# Patient Record
Sex: Male | Born: 1935 | Race: White | Hispanic: No | Marital: Married | Smoking: Never smoker
Health system: Southern US, Community
[De-identification: ages and names within clinical notes are randomized; demographics above are authoritative.]

## PROBLEM LIST (undated history)

## (undated) DIAGNOSIS — I255 Ischemic cardiomyopathy: Secondary | ICD-10-CM

## (undated) DIAGNOSIS — E669 Obesity, unspecified: Secondary | ICD-10-CM

## (undated) DIAGNOSIS — I442 Atrioventricular block, complete: Secondary | ICD-10-CM

## (undated) DIAGNOSIS — N529 Male erectile dysfunction, unspecified: Secondary | ICD-10-CM

## (undated) DIAGNOSIS — E785 Hyperlipidemia, unspecified: Secondary | ICD-10-CM

## (undated) DIAGNOSIS — Z8619 Personal history of other infectious and parasitic diseases: Secondary | ICD-10-CM

## (undated) DIAGNOSIS — Z95810 Presence of automatic (implantable) cardiac defibrillator: Secondary | ICD-10-CM

## (undated) DIAGNOSIS — I480 Paroxysmal atrial fibrillation: Secondary | ICD-10-CM

## (undated) DIAGNOSIS — I495 Sick sinus syndrome: Secondary | ICD-10-CM

## (undated) DIAGNOSIS — G4733 Obstructive sleep apnea (adult) (pediatric): Secondary | ICD-10-CM

## (undated) DIAGNOSIS — E119 Type 2 diabetes mellitus without complications: Secondary | ICD-10-CM

## (undated) DIAGNOSIS — I10 Essential (primary) hypertension: Secondary | ICD-10-CM

## (undated) HISTORY — DX: Hyperlipidemia, unspecified: E78.5

## (undated) HISTORY — DX: Type 2 diabetes mellitus without complications: E11.9

## (undated) HISTORY — DX: Sick sinus syndrome: I49.5

## (undated) HISTORY — PX: TONSILLECTOMY: SUR1361

## (undated) HISTORY — DX: Ischemic cardiomyopathy: I25.5

## (undated) HISTORY — DX: Personal history of other infectious and parasitic diseases: Z86.19

## (undated) HISTORY — DX: Atrioventricular block, complete: I44.2

## (undated) HISTORY — DX: Obstructive sleep apnea (adult) (pediatric): G47.33

## (undated) HISTORY — DX: Presence of automatic (implantable) cardiac defibrillator: Z95.810

## (undated) HISTORY — PX: CORONARY ARTERY BYPASS GRAFT: SHX141

## (undated) HISTORY — DX: Essential (primary) hypertension: I10

## (undated) HISTORY — DX: Obesity, unspecified: E66.9

## (undated) HISTORY — DX: Paroxysmal atrial fibrillation: I48.0

## (undated) HISTORY — DX: Male erectile dysfunction, unspecified: N52.9

---

## 1999-06-25 ENCOUNTER — Inpatient Hospital Stay (HOSPITAL_COMMUNITY): Admission: EM | Admit: 1999-06-25 | Discharge: 1999-07-09 | Payer: Self-pay | Admitting: Internal Medicine

## 1999-06-28 ENCOUNTER — Encounter: Payer: Self-pay | Admitting: *Deleted

## 1999-06-29 ENCOUNTER — Encounter: Payer: Self-pay | Admitting: Cardiothoracic Surgery

## 1999-06-30 ENCOUNTER — Encounter: Payer: Self-pay | Admitting: Cardiothoracic Surgery

## 1999-07-01 ENCOUNTER — Encounter: Payer: Self-pay | Admitting: Cardiothoracic Surgery

## 1999-07-02 ENCOUNTER — Encounter: Payer: Self-pay | Admitting: Cardiothoracic Surgery

## 1999-07-03 ENCOUNTER — Encounter: Payer: Self-pay | Admitting: Cardiothoracic Surgery

## 1999-07-04 ENCOUNTER — Encounter: Payer: Self-pay | Admitting: Cardiothoracic Surgery

## 1999-07-06 ENCOUNTER — Encounter: Payer: Self-pay | Admitting: Cardiothoracic Surgery

## 1999-07-07 ENCOUNTER — Encounter: Payer: Self-pay | Admitting: Cardiothoracic Surgery

## 1999-07-08 ENCOUNTER — Encounter: Payer: Self-pay | Admitting: Cardiothoracic Surgery

## 2001-07-24 ENCOUNTER — Ambulatory Visit (HOSPITAL_COMMUNITY): Admission: RE | Admit: 2001-07-24 | Discharge: 2001-07-25 | Payer: Self-pay | Admitting: Internal Medicine

## 2001-07-24 ENCOUNTER — Encounter: Payer: Self-pay | Admitting: Internal Medicine

## 2001-07-25 ENCOUNTER — Encounter: Payer: Self-pay | Admitting: Internal Medicine

## 2004-11-11 ENCOUNTER — Ambulatory Visit: Payer: Self-pay

## 2005-02-14 ENCOUNTER — Ambulatory Visit: Payer: Self-pay | Admitting: Internal Medicine

## 2005-02-28 ENCOUNTER — Ambulatory Visit: Payer: Self-pay

## 2005-04-04 ENCOUNTER — Ambulatory Visit: Payer: Self-pay | Admitting: Internal Medicine

## 2005-05-11 ENCOUNTER — Ambulatory Visit: Payer: Self-pay | Admitting: Internal Medicine

## 2005-06-20 ENCOUNTER — Ambulatory Visit: Payer: Self-pay | Admitting: Internal Medicine

## 2005-08-16 ENCOUNTER — Ambulatory Visit: Payer: Self-pay | Admitting: Internal Medicine

## 2005-09-19 ENCOUNTER — Ambulatory Visit: Payer: Self-pay | Admitting: Internal Medicine

## 2005-10-25 ENCOUNTER — Ambulatory Visit: Payer: Self-pay | Admitting: Internal Medicine

## 2005-11-28 ENCOUNTER — Ambulatory Visit: Payer: Self-pay | Admitting: Internal Medicine

## 2006-01-18 ENCOUNTER — Ambulatory Visit: Payer: Self-pay | Admitting: Internal Medicine

## 2006-04-19 ENCOUNTER — Ambulatory Visit: Payer: Self-pay | Admitting: Internal Medicine

## 2006-05-15 ENCOUNTER — Ambulatory Visit: Payer: Self-pay | Admitting: Internal Medicine

## 2006-06-26 ENCOUNTER — Ambulatory Visit: Payer: Self-pay | Admitting: Internal Medicine

## 2006-07-28 ENCOUNTER — Ambulatory Visit: Payer: Self-pay | Admitting: Internal Medicine

## 2006-08-28 ENCOUNTER — Ambulatory Visit: Payer: Self-pay | Admitting: Internal Medicine

## 2006-09-25 ENCOUNTER — Ambulatory Visit: Payer: Self-pay | Admitting: Internal Medicine

## 2006-10-23 ENCOUNTER — Ambulatory Visit: Payer: Self-pay | Admitting: Internal Medicine

## 2006-11-20 ENCOUNTER — Ambulatory Visit: Payer: Self-pay | Admitting: Internal Medicine

## 2006-12-18 ENCOUNTER — Ambulatory Visit: Payer: Self-pay | Admitting: Internal Medicine

## 2007-01-15 ENCOUNTER — Ambulatory Visit: Payer: Self-pay | Admitting: Internal Medicine

## 2007-02-13 ENCOUNTER — Ambulatory Visit: Payer: Self-pay | Admitting: Internal Medicine

## 2007-03-12 ENCOUNTER — Ambulatory Visit: Payer: Self-pay | Admitting: Internal Medicine

## 2007-04-09 ENCOUNTER — Ambulatory Visit: Payer: Self-pay | Admitting: Internal Medicine

## 2007-04-23 ENCOUNTER — Ambulatory Visit: Payer: Self-pay

## 2007-05-07 ENCOUNTER — Ambulatory Visit: Payer: Self-pay | Admitting: Internal Medicine

## 2007-06-04 ENCOUNTER — Ambulatory Visit: Payer: Self-pay | Admitting: Internal Medicine

## 2007-07-02 ENCOUNTER — Ambulatory Visit: Payer: Self-pay | Admitting: Internal Medicine

## 2007-07-30 ENCOUNTER — Ambulatory Visit: Payer: Self-pay | Admitting: Internal Medicine

## 2007-08-27 ENCOUNTER — Ambulatory Visit: Payer: Self-pay | Admitting: Internal Medicine

## 2007-09-24 ENCOUNTER — Ambulatory Visit: Payer: Self-pay | Admitting: Internal Medicine

## 2007-10-22 ENCOUNTER — Ambulatory Visit: Payer: Self-pay | Admitting: Internal Medicine

## 2007-11-19 ENCOUNTER — Ambulatory Visit: Payer: Self-pay | Admitting: Internal Medicine

## 2008-03-17 ENCOUNTER — Ambulatory Visit: Payer: Self-pay | Admitting: Internal Medicine

## 2008-04-21 ENCOUNTER — Ambulatory Visit: Payer: Self-pay

## 2008-06-16 ENCOUNTER — Ambulatory Visit: Payer: Self-pay | Admitting: Internal Medicine

## 2008-09-15 ENCOUNTER — Ambulatory Visit: Payer: Self-pay | Admitting: Internal Medicine

## 2008-11-07 ENCOUNTER — Ambulatory Visit: Payer: Self-pay | Admitting: Internal Medicine

## 2008-12-15 ENCOUNTER — Ambulatory Visit: Payer: Self-pay | Admitting: Internal Medicine

## 2009-01-23 ENCOUNTER — Encounter: Payer: Self-pay | Admitting: Internal Medicine

## 2009-01-27 ENCOUNTER — Ambulatory Visit: Payer: Self-pay | Admitting: Internal Medicine

## 2009-04-06 ENCOUNTER — Telehealth (INDEPENDENT_AMBULATORY_CARE_PROVIDER_SITE_OTHER): Payer: Self-pay | Admitting: *Deleted

## 2009-06-15 ENCOUNTER — Ambulatory Visit: Payer: Self-pay | Admitting: Internal Medicine

## 2009-08-04 ENCOUNTER — Ambulatory Visit: Payer: Self-pay | Admitting: Internal Medicine

## 2009-09-14 ENCOUNTER — Ambulatory Visit: Payer: Self-pay | Admitting: Internal Medicine

## 2009-10-05 ENCOUNTER — Encounter: Payer: Self-pay | Admitting: Internal Medicine

## 2009-10-05 ENCOUNTER — Ambulatory Visit: Payer: Self-pay

## 2009-11-03 ENCOUNTER — Ambulatory Visit: Payer: Self-pay | Admitting: Internal Medicine

## 2009-11-04 ENCOUNTER — Encounter: Payer: Self-pay | Admitting: Internal Medicine

## 2009-11-06 ENCOUNTER — Telehealth: Payer: Self-pay | Admitting: Internal Medicine

## 2009-11-19 ENCOUNTER — Telehealth (INDEPENDENT_AMBULATORY_CARE_PROVIDER_SITE_OTHER): Payer: Self-pay | Admitting: *Deleted

## 2009-11-19 ENCOUNTER — Ambulatory Visit: Payer: Self-pay | Admitting: Internal Medicine

## 2009-11-19 DIAGNOSIS — R55 Syncope and collapse: Secondary | ICD-10-CM

## 2009-11-24 ENCOUNTER — Telehealth (INDEPENDENT_AMBULATORY_CARE_PROVIDER_SITE_OTHER): Payer: Self-pay | Admitting: *Deleted

## 2009-11-25 ENCOUNTER — Ambulatory Visit: Payer: Self-pay | Admitting: Cardiology

## 2009-11-25 ENCOUNTER — Encounter: Payer: Self-pay | Admitting: Internal Medicine

## 2009-11-25 ENCOUNTER — Encounter (HOSPITAL_COMMUNITY): Admission: RE | Admit: 2009-11-25 | Discharge: 2010-01-25 | Payer: Self-pay | Admitting: Internal Medicine

## 2009-11-25 ENCOUNTER — Ambulatory Visit: Payer: Self-pay

## 2009-12-01 ENCOUNTER — Ambulatory Visit: Payer: Self-pay | Admitting: Internal Medicine

## 2009-12-02 ENCOUNTER — Telehealth: Payer: Self-pay | Admitting: Cardiology

## 2009-12-10 ENCOUNTER — Encounter: Payer: Self-pay | Admitting: Internal Medicine

## 2009-12-14 ENCOUNTER — Telehealth: Payer: Self-pay | Admitting: Internal Medicine

## 2009-12-15 ENCOUNTER — Ambulatory Visit: Payer: Self-pay | Admitting: Internal Medicine

## 2009-12-15 LAB — CONVERTED CEMR LAB
BUN: 16 mg/dL (ref 6–23)
Basophils Absolute: 0.1 10*3/uL (ref 0.0–0.1)
Basophils Relative: 0.9 % (ref 0.0–3.0)
CO2: 30 meq/L (ref 19–32)
Calcium: 10.6 mg/dL — ABNORMAL HIGH (ref 8.4–10.5)
Chloride: 104 meq/L (ref 96–112)
Creatinine, Ser: 1.1 mg/dL (ref 0.4–1.5)
Eosinophils Absolute: 0.5 10*3/uL (ref 0.0–0.7)
Eosinophils Relative: 6.3 % — ABNORMAL HIGH (ref 0.0–5.0)
GFR calc non Af Amer: 68.92 mL/min (ref >60)
Glucose, Bld: 107 mg/dL — ABNORMAL HIGH (ref 70–99)
HCT: 45.9 % (ref 39.0–52.0)
Hemoglobin: 15.1 g/dL (ref 13.0–17.0)
INR: 1.1 — ABNORMAL HIGH (ref 0.8–1.0)
Lymphocytes Relative: 21.6 % (ref 12.0–46.0)
Lymphs Abs: 1.7 10*3/uL (ref 0.7–4.0)
MCHC: 32.8 g/dL (ref 30.0–36.0)
MCV: 98.3 fL (ref 78.0–100.0)
Monocytes Absolute: 0.6 10*3/uL (ref 0.1–1.0)
Monocytes Relative: 7.3 % (ref 3.0–12.0)
Neutro Abs: 5.1 10*3/uL (ref 1.4–7.7)
Neutrophils Relative %: 63.9 % (ref 43.0–77.0)
Platelets: 214 10*3/uL (ref 150.0–400.0)
Potassium: 4.1 meq/L (ref 3.5–5.1)
Prothrombin Time: 11.3 s (ref 9.1–11.7)
RBC: 4.67 M/uL (ref 4.22–5.81)
RDW: 12.9 % (ref 11.5–14.6)
Sodium: 139 meq/L (ref 135–145)
WBC: 8 10*3/uL (ref 4.5–10.5)
aPTT: 27.6 s (ref 21.7–28.8)

## 2009-12-16 ENCOUNTER — Inpatient Hospital Stay (HOSPITAL_BASED_OUTPATIENT_CLINIC_OR_DEPARTMENT_OTHER): Admission: RE | Admit: 2009-12-16 | Discharge: 2009-12-16 | Payer: Self-pay | Admitting: Cardiology

## 2009-12-16 ENCOUNTER — Ambulatory Visit: Payer: Self-pay | Admitting: Cardiology

## 2009-12-22 ENCOUNTER — Ambulatory Visit: Payer: Self-pay | Admitting: Internal Medicine

## 2009-12-29 ENCOUNTER — Ambulatory Visit: Payer: Self-pay | Admitting: Internal Medicine

## 2010-01-20 ENCOUNTER — Inpatient Hospital Stay (HOSPITAL_COMMUNITY): Admission: EM | Admit: 2010-01-20 | Discharge: 2010-01-22 | Payer: Self-pay | Admitting: Cardiology

## 2010-01-20 ENCOUNTER — Ambulatory Visit: Payer: Self-pay

## 2010-01-20 ENCOUNTER — Encounter: Payer: Self-pay | Admitting: Internal Medicine

## 2010-01-20 ENCOUNTER — Ambulatory Visit: Payer: Self-pay | Admitting: Cardiology

## 2010-01-21 ENCOUNTER — Encounter (INDEPENDENT_AMBULATORY_CARE_PROVIDER_SITE_OTHER): Payer: Self-pay | Admitting: Cardiology

## 2010-01-22 ENCOUNTER — Encounter: Payer: Self-pay | Admitting: Internal Medicine

## 2010-01-26 ENCOUNTER — Encounter: Payer: Self-pay | Admitting: Internal Medicine

## 2010-02-01 ENCOUNTER — Ambulatory Visit: Payer: Self-pay

## 2010-02-01 ENCOUNTER — Encounter: Payer: Self-pay | Admitting: Internal Medicine

## 2010-02-19 ENCOUNTER — Telehealth: Payer: Self-pay | Admitting: Internal Medicine

## 2010-05-10 ENCOUNTER — Ambulatory Visit: Payer: Self-pay | Admitting: Internal Medicine

## 2010-08-11 ENCOUNTER — Encounter: Payer: Self-pay | Admitting: Internal Medicine

## 2010-08-12 ENCOUNTER — Ambulatory Visit: Payer: Self-pay | Admitting: Internal Medicine

## 2010-09-01 ENCOUNTER — Encounter: Payer: Self-pay | Admitting: Internal Medicine

## 2010-11-23 ENCOUNTER — Ambulatory Visit
Admission: RE | Admit: 2010-11-23 | Discharge: 2010-11-23 | Payer: Self-pay | Source: Home / Self Care | Attending: Internal Medicine | Admitting: Internal Medicine

## 2010-11-23 ENCOUNTER — Encounter: Payer: Self-pay | Admitting: Internal Medicine

## 2010-12-21 NOTE — Assessment & Plan Note (Signed)
Summary: Cardiology Nuclear Study  Nuclear Med Background Indications for Stress Test: Evaluation for Ischemia, Graft Patency   History: CABG, Echo, Heart Catheterization, Myocardial Infarction, Pacemaker  History Comments: '00 Heart Cath '00 Echo: NL, LVF 9/02 Pacemaker: Complete HB  Symptoms: Light-Headedness, Syncope    Nuclear Pre-Procedure Cardiac Risk Factors: Carotid Disease, Hypertension, Lipids, Obesity Caffeine/Decaff Intake: None NPO After: 6:00 PM Lungs: clear IV 0.9% NS with Angio Cath: 20g     IV Site: (R) AC IV Started by: Irven Baltimore RN Chest Size (in) 56     Height (in): 73 Weight (lb): 330 BMI: 43.70  Nuclear Med Study 1 or 2 day study:  1 day     Stress Test Type:  Adenosine Reading MD:  Loralie Champagne, MD     Referring MD:  S.Klein Resting Radionuclide:  Technetium 1m Tetrofosmin     Resting Radionuclide Dose:  11.0 mCi  Stress Radionuclide:  Technetium 66m Tetrofosmin     Stress Radionuclide Dose:  33.0 mCi   Stress Protocol  Dose of Adenosine:  60.0 mg    Stress Test Technologist:  Perrin Maltese EMT-P     Nuclear Technologist:  Mariann Laster Deal RT-N  Rest Procedure  Myocardial perfusion imaging was performed at rest 45 minutes following the intravenous administration of Myoview Technetium 16m Tetrofosmin.  Stress Procedure  The patient received IV adenosine at 140 mcg/kg/min for 4 minutes. There were no significant changes with infusion. Myoview was injected at the 2 minute mark and quantitative spect images were obtained after a 45 minute delay.  QPS Raw Data Images:  Normal; no motion artifact; normal heart/lung ratio. Stress Images:  Large, severe perfusion defect involving the entire inferior wall to the apex.  Rest Images:  Moderate perfusion defect in the inferior wall.  Subtraction (SDS):  Inferior perfusion defect with significant reversibility.  Transient Ischemic Dilatation:  1.22  (Normal <1.22)  Lung/Heart Ratio:  .32  (Normal  <0.45)  Quantitative Gated Spect Images QGS EDV:  205 ml QGS ESV:  138 ml QGS EF:  33 % QGS cine images:  Inferior and septal hypokinesis.    Overall Impression  Exercise Capacity: Adenosine study with no exercise. BP Response: Normal blood pressure response. Clinical Symptoms: Hot, lightheaded.  ECG Impression: V-paced with left bundle pattern.  Overall Impression: There is a severe inferior perfusion defect with stress that is somewhat improved with rest.  Overall Impression Comments: Probable inferior infarction with significant peri-infarct ischemia.  Moderate LV systolic dysfunction with inferior severe hypokinesis.   Appended Document: Cardiology Nuclear Study Problem # 1:  SYNCOPE (ICD-780.2) the patient had an episode of syncope in the setting of known coronary disease previous normal left ventricular function tachybradycardia syndrome with pacemaker implanted. Interrogation of the pacemaker demonstrated no evidence of ventricular tachycardia; device function was normalthe event occurred in the setting of micturition and rapid standing. I suspect it represents a neurally mediated/vasovagal episode as was thought by Dr. Lin Landsman. However, in a patient with ischemic heart disease it is important he should there has been no intercurrent change in his left ventricular function and we'll undertake Myoview scanning to look perfusion function and for evidence of scar His updated medication list for this problem includes:    Toprol Xl 50 Mg Xr24h-tab (Metoprolol succinate) .Marland Kitchen... Take one tab once daily    Aspirin 81 Mg Tbec (Aspirin) .Marland Kitchen... Take one tablet by mouth daily  Other Orders: Nuclear Stress Test (Nuc Stress Test)   Abnormal..Marland KitchenPlease Advise  Appended Document: Cardiology Nuclear  Study Per Dr. Caryl Comes, his heart muscle is weaker and he has a potential blockage. He will need set up for a JV Cath.  Appended Document: Cardiology Nuclear Study left mess w/pts wife for him to call  back and ask for mess rn  Appended Document: Cardiology Nuclear Study pt aware, cath sch for Wed 1/26 at 8:30 w/Dr Lia Foyer

## 2010-12-21 NOTE — Procedures (Signed)
Summary: wound check   Current Medications (verified): 1)  Lipitor 40 Mg Tabs (Atorvastatin Calcium) .... Take One Tab Once Daily 2)  Toprol Xl 50 Mg Xr24h-Tab (Metoprolol Succinate) .... Take One Tab Once Daily 3)  Synthroid 175 Mcg Tabs (Levothyroxine Sodium) .... Take One Tab Once Daily 4)  Coumadin .... Take As Directed 5)  Avodart 0.5 Mg Caps (Dutasteride) .... Once Daily 6)  Flomax 0.4 Mg Xr24h-Cap (Tamsulosin Hcl) .... Once Daily 7)  Aspirin 81 Mg Tbec (Aspirin) .... Take One Tablet By Mouth Daily 8)  Super Beta Prostate 600 Mg .... Once Daily 9)  Diovan Hct 160-25 Mg Tabs (Valsartan-Hydrochlorothiazide) .... Take One Tablet Once Daily 10)  Metformin Hcl 500 Mg Tabs (Metformin Hcl) .... Take One Tablet Two Times A Day  Allergies (verified): No Known Drug Allergies  PPM Specifications Following MD:  Virl Axe, MD     PPM Vendor:  Biotronik     PPM Model Number:  [id number]     PPM Serial Number:  [id number] DOI:  07/24/2001     PPM Implanting MD:  Virl Axe, MD  Lead 1    Location: RA     DOI: 07/24/2001     Model #: [id number]     Serial #: [id number]     Status: active Lead 2    Location: RV     DOI: 07/24/2001     Model #: [id number]     Serial #: [id number]     Status: active  Magnet Response Rate:  BOL 10BTS @ 90 -PR ERI  10BTS @ 80-11% PR  Indications:  Syncope  Explantation Comments:  01/21/10 Biotronik [id number] explanted.  PPM Follow Up Pacer Dependent:  Yes      Episodes Coumadin:  No  Parameters Mode:  DDDR     Lower Rate Limit:  60     Upper Rate Limit:  130 Paced AV Delay:  300      ICD Specifications Following MD:  Virl Axe, MD     ICD Vendor:  St Jude     ICD Model Number:  [id number]     ICD Serial Number:  [id number] ICD DOI:  01/21/2010     ICD Implanting MD:  Thompson Grayer, MD  Lead 1:    Location: RV     DOI: 01/21/2010     Model #: [id number]     Serial #: [id number]     Status: active Lead 2:    Location: RA     DOI: 08/20/2001     Model #: [id number]     Serial  #: [id number]     Status: active Lead 3:    Location: RV     DOI: 08/20/2001     Model #: [id number]     Serial #: [id number]     Status: capped  Indications::  CM   ICD Follow Up Remote Check?  No Charge Time:  8.3 seconds     Battery Est. Longevity:  8.2 YEARS Underlying rhythm:  dependent ICD Dependent:  Yes       ICD Device Measurements Atrium:  Amplitude: 4.9 mV, Impedance: 430 ohms, Threshold: 0.75 V at 0.5 msec Right Ventricle:  Impedance: 400 ohms, Threshold: 0.75 V at 0.5 msec Shock Impedance: 46 ohms   Episodes MS Episodes:  0     Percent Mode Switch:  0     Coumadin:  Yes Shock:  0     ATP:  0     Nonsustained:  0      Brady Parameters Mode DDD     Lower Rate Limit:  60     Upper Rate Limit 120 PAV 190     Sensed AV Delay:  170  Tachy Zones VF:  200     VT:  171     Next Cardiology Appt Due:  04/21/2010 Tech Comments:  No parameter changes.   Device function normal.  Steri strips removed, no redness, some edema noted.  Patient instructions for device discharge and activity restrictions review with the patient.  He understands and is in agreement.  ROV 3 months with Dr. Caryl Comes in the [facility] office.  Alma Friendly, LPN  March 14, 624THL 075-GRM PM

## 2010-12-21 NOTE — Cardiovascular Report (Signed)
Summary: Office Visit   Office Visit   Imported By: Sallee Provencal 12/08/2009 09:58:26  _____________________________________________________________________  External Attachment:    Type:   Image     Comment:   External Document

## 2010-12-21 NOTE — Cardiovascular Report (Signed)
Summary: Office Visit   Office Visit   Imported By: Sallee Provencal 02/08/2010 15:18:01  _____________________________________________________________________  External Attachment:    Type:   Image     Comment:   External Document

## 2010-12-21 NOTE — Cardiovascular Report (Signed)
Summary: TTM   TTM   Imported By: Sallee Provencal 12/23/2009 10:21:00  _____________________________________________________________________  External Attachment:    Type:   Image     Comment:   External Document

## 2010-12-21 NOTE — Cardiovascular Report (Signed)
Summary: Office Visit Remote   Office Visit Remote   Imported By: Sallee Provencal 09/02/2010 11:14:40  _____________________________________________________________________  External Attachment:    Type:   Image     Comment:   External Document

## 2010-12-21 NOTE — Procedures (Signed)
Summary: ABNORMAL EKG/ MMR   Current Medications (verified): 1)  Lipitor 40 Mg Tabs (Atorvastatin Calcium) .... Take One Tab Once Daily 2)  Toprol Xl 50 Mg Xr24h-Tab (Metoprolol Succinate) .... Take One Tab Once Daily 3)  Synthroid 175 Mcg Tabs (Levothyroxine Sodium) .... Take One Tab Once Daily 4)  Coumadin .... Take As Directed 5)  Flomax 0.4 Mg Xr24h-Cap (Tamsulosin Hcl) .... Once Daily 6)  Aspirin 81 Mg Tbec (Aspirin) .... Take One Tablet By Mouth Daily 7)  Super Beta Prostate 600 Mg .... Once Daily 8)  Diovan Hct 160-25 Mg Tabs (Valsartan-Hydrochlorothiazide) .... Take One Tablet Once Daily 9)  Metformin Hcl 500 Mg Tabs (Metformin Hcl) .... Take One Tablet Two Times A Day  Allergies (verified): No Known Drug Allergies  PPM Specifications Following MD:  Virl Axe, MD     PPM Vendor:  Biotronik     PPM Model Number:  [id number]     PPM Serial Number:  [id number] PPM DOI:  07/24/2001     PPM Implanting MD:  Virl Axe, MD  Lead 1    Location: RA     DOI: 07/24/2001     Model #: [id number]     Serial #: [id number]     Status: active Lead 2    Location: RV     DOI: 07/24/2001     Model #: [id number]     Serial #: [id number]     Status: active  Magnet Response Rate:  BOL 10BTS @ 90 -PR ERI  10BTS @ 80-11% PR  Indications:  Syncope  Explantation Comments:  TTM's with [employer]  PPM Follow Up Remote Check?  No Battery Voltage:  2.51 V     Battery Est. Longevity:  ERI     Pacer Dependent:  Yes      Episodes Coumadin:  No  Parameters Mode:  DDDR     Lower Rate Limit:  60     Upper Rate Limit:  130 Paced AV Delay:  300     Tech Comments:  Device @ ERI.  Estimated <3 months until EOL and Caleb Lyons is dependent.  He also c/o random near syncopal episodes.  The device does revert to DDD only but his histograms are adequate.  He will follow up with Dr. Caryl Comes.   Alma Friendly, LPN  March  2, 624THL D34-534 PM

## 2010-12-21 NOTE — Procedures (Signed)
Summary: Cardiology Device Clinic   Allergies: No Known Drug Allergies  PPM Specifications Following MD:  Virl Axe, MD     PPM Vendor:  Biotronik     PPM Model Number:  [id number]     PPM Serial Number:  [id number] DOI:  07/24/2001     PPM Implanting MD:  Virl Axe, MD  Lead 1    Location: RA     DOI: 07/24/2001     Model #: [id number]     Serial #: [id number]     Status: active Lead 2    Location: RV     DOI: 07/24/2001     Model #: [id number]     Serial #: [id number]     Status: active  Magnet Response Rate:  BOL 10BTS @ 90 -PR ERI  10BTS @ 80-11% PR  Indications:  Syncope  Explantation Comments:  01/21/10 Biotronik [id number] explanted.  PPM Follow Up Pacer Dependent:  Yes      Episodes Coumadin:  No  Parameters Mode:  DDDR     Lower Rate Limit:  60     Upper Rate Limit:  130 Paced AV Delay:  300      ICD Specifications Following MD:  Virl Axe, MD     ICD Vendor:  St Jude     ICD Model Number:  [phone]     ICD Serial Number:  [id number] ICD DOI:  01/21/2010     ICD Implanting MD:  Thompson Grayer, MD  Lead 1:    Location: RV     DOI: 01/21/2010     Model #: [id number]     Serial #: [id number]     Status: active Lead 2:    Location: RA     DOI: 08/20/2001     Model #: [id number]     Serial #: [id number]     Status: active Lead 3:    Location: RV     DOI: 08/20/2001     Model #: [id number]     Serial #: [id number]     Status: capped  Indications::  CM   ICD Follow Up Remote Check?  No Charge Time:  8.3 seconds     Battery Est. Longevity:  8 years Underlying rhythm:  dependent ICD Dependent:  Yes       ICD Device Measurements Atrium:  Amplitude: 5.0 mV, Impedance: 450 ohms, Threshold: 0.75 V at 0.5 msec Right Ventricle:  Impedance: 460 ohms, Threshold: 0.625 V at 0.5 msec Shock Impedance: 48 ohms   Episodes MS Episodes:  0     Percent Mode Switch:  0     Coumadin:  Yes Shock:  0     ATP:  0     Nonsustained:  0     Atrial Pacing:  38%     Ventricular Pacing:  100%  Brady Parameters Mode  DDD     Lower Rate Limit:  60     Upper Rate Limit 120 PAV 190     Sensed AV Delay:  170  Tachy Zones VF:  200     VT:  171     Next Remote Date:  08/12/2010     Next Cardiology Appt Due:  04/22/2011 Tech Comments:  No parameter changes.  Device function normal.  Merlin transmissions every 3 months.  ROV 1 year with Dr. Caryl Comes in [place]. Alma Friendly, LPN  June 20, 624THL 624THL AM

## 2010-12-21 NOTE — Progress Notes (Signed)
Summary: Nuclear Pre-Procedure  Phone Note Outgoing Call Call back at [facility] Phone [phone]   Call placed by: Eliezer Lofts, EMT-P,  November 24, 2009 11:29 AM Action Taken: Phone Call Completed Summary of Call: Left message with information on Myoview Information Sheet (see scanned document for details).     Nuclear Med Background Indications for Stress Test: Evaluation for Ischemia, Graft Patency   History: CABG, Echo, Heart Catheterization, Myocardial Infarction, Pacemaker  History Comments: '00 Heart Cath '00 Echo: NL, LVF 9/02 Pacemaker: Complete HB  Symptoms: Light-Headedness, Syncope    Nuclear Pre-Procedure Cardiac Risk Factors: Carotid Disease, Hypertension, Lipids, Obesity Height (in): 73

## 2010-12-21 NOTE — Progress Notes (Signed)
Summary: test results  Phone Note Call from Patient Call back at Home Phone [phone]   Caller: Patient Reason for Call: Lab or Test Results Summary of Call: request results of stress test Initial call taken by: Darnell Level,  December 02, 2009 10:20 AM  Follow-up for Phone Call        PT AWARE OF RESULTS Follow-up by: Devra Dopp, LPN,  January 12, 624THL 10:33 AM     Appended Document: test results Would make sure this result gets to Dr. Caryl Comes.  I believe this is his patient.

## 2010-12-21 NOTE — Letter (Signed)
Summary: Remote Device Check  [employer], [place]  [id number] [place]   [place], [place] [address]   Phone: [phone]  Fax: [phone]     September 01, 2010 MRN: [id number]   Caleb Lyons [address] [place],   [address]   Dear Caleb Lyons,   Your remote transmission was recieved and reviewed by your physician.  All diagnostics were within normal limits for you.   __X____Your next office visit is scheduled for:  11-23-2010 with Dr Caryl Comes.    Sincerely,  Shelly Bombard

## 2010-12-21 NOTE — Cardiovascular Report (Signed)
Summary: TTM   TTM   Imported By: Sallee Provencal 01/08/2010 15:49:29  _____________________________________________________________________  External Attachment:    Type:   Image     Comment:   External Document

## 2010-12-21 NOTE — Assessment & Plan Note (Signed)
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 He is doing quite well and denies chest pain exercise intolerance. .  He had an episode of syncope that occurred in the bathroom. He urinated while sitting down and stood up to brush his teeth when he became lightheaded and abruptly lost consciousness. He hit his head and hand up with some bleeding. He was able to stand up shortly thereafter without residual orthostatic intolerance.  He does have a history of prior orthostatic lightheadedness.  echo cardiogram in the spring of 2000 and demonstrated normal left ventricular function    He underwent Myoview scanning which demonstrated ejection fraction 33% and a prior infarct. Because of the depressed LV function he underwent   catheterization done last week demonstrated  1. Continued patency of the internal mammary to the left anterior     descending. 2. Continued patency of the saphenous vein grafts to the intermediate,     diagonal, and sequentially to the obtuse marginal and distal     circumflex as noted above. 3. Continued patency of the right coronary artery with some modest     luminal irregularity.  He is approaching ERI. He is currently without complaint has been exercising again without shortness of breath or chest pain  Current Medications (verified): 1)  Lipitor 40 Mg Tabs (Atorvastatin Calcium) .... Take One Tab Once Daily 2)  Toprol Xl 50 Mg Xr24h-Tab (Metoprolol Succinate) .... Take One Tab Once Daily 3)  Synthroid 175 Mcg Tabs (Levothyroxine Sodium) .... Take One Tab Once Daily 4)  Coumadin .... Take As Directed 5)  Avodart 0.5 Mg Caps (Dutasteride) .... Once Daily 6)  Flomax 0.4 Mg Xr24h-Cap (Tamsulosin Hcl) .... Once Daily 7)  Aspirin 81 Mg Tbec (Aspirin) .... Take One Tablet By Mouth Daily 8)  Super Beta Prostate 600 Mg .... Once Daily 9)  Diovan Hct 160-25 Mg Tabs  (Valsartan-Hydrochlorothiazide) .... Take One Tablet Once Daily 10)  Metformin Hcl 500 Mg Tabs (Metformin Hcl) .... Take One Tablet Two Times A Day  Allergies (verified): No Known Drug Allergies  Past History:  Past Medical History: Last updated: 11/19/2009 Hyperlipidemia coronary artery disease with prior bypass grafting and normal left ventricular function Paroxysmal atrial fibrillation Sinus node dysfunction Hypertension Biotronik Philos [id number] Obesity  Family History: Last updated: 11/19/2009 Negative FH of Diabetes, Hypertension, or Coronary Artery Disease  Vital Signs:  Patient profile:   77 year old male Height:      73 inches Weight:      335 pounds BMI:     44.36 Pulse rate:   76 / minute Pulse rhythm:   regular BP sitting:   133 / 81  (right arm) Cuff size:   large  Vitals Entered By: Doug Sou CMA (December 22, 2009 2:38 PM)  Physical Exam  General:  The patient was alert and oriented in no acute distress. HEENT Normal.  Neck veins were flat, carotids were brisk.  Lungs were clear.  Heart sounds were regular without murmurs or gallops.  Abdomen was soft with active bowel sounds; protuberant There is no clubbing cyanosis or edema. Skin Warm and dry    PPM Specifications Following MD:  Virl Axe, MD     PPM Vendor:  Biotronik     PPM Model Number:  [id number]     [facility] Serial Number:  [id number] PPM DOI:  07/24/2001     PPM Implanting MD:  Virl Axe, MD  Lead 1    Location: RA     DOI: 07/24/2001     Model #: [id number]     Serial #: [id number]     Status: active Lead 2    Location: RV     DOI: 07/24/2001     Model #: [id number]     Serial #: [id number]     Status: active  Magnet Response Rate:  BOL 10BTS @ 90 -PR ERI  10BTS @ 80-11% PR  Indications:  Syncope  Explantation Comments:  TTM's with [employer]  PPM Follow Up Pacer Dependent:  Yes      Episodes Coumadin:  No  Parameters Mode:  DDDR     Lower Rate Limit:  60     Upper Rate Limit:  130 Paced AV  Delay:  300     Impression & Recommendations:  Problem # 1:  PACEMAKER, PERMANENT BIOTRONIK (ICD-V45.01) Device parameters and data were reviewed and no changes were made  He is approaching ERI. When he comes in for his preprocedural visit we will have to discuss the potential issues related to his cardiomyopathy syncope and device. Specifically we'll have to consider either in. ICD implantation versus EP testing for risk stratification with his borderline ejection fraction.  Problem # 2:  CARDIOMYOPATHY, ISCHEMIC S/P CABG EF 33% (ICD-414.8)  as above.  We will continue him on his current medications.  Will plan to begin Aldactone based on theEmphasis trial at time of device generator placement His updated medication list for this problem includes:    Toprol Xl 50 Mg Xr24h-tab (Metoprolol succinate) .Marland Kitchen... Take one tab once daily    Aspirin 81 Mg Tbec (Aspirin) .Marland Kitchen... Take one tablet by mouth daily    Diovan Hct 160-25 Mg Tabs (Valsartan-hydrochlorothiazide) .Marland Kitchen... Take one tablet once daily  Problem # 3:  SYNCOPE (ICD-780.2) Please see above discussion His updated medication list for this problem includes:    Toprol Xl 50 Mg Xr24h-tab (Metoprolol succinate) .Marland Kitchen... Take one tab once daily    Aspirin 81 Mg Tbec (Aspirin) .Marland Kitchen... Take one tablet by mouth daily  Problem # 4:  COUMADIN THERAPY (ICD-V58.61) Or records indicate that AF was identified in 2003  We will plan to rview our pacer records to see if there has been intercurrent AF which would justify the need to continue;  Patient Instructions: 1)  Your physician recommends that you schedule a follow-up appointment in: [place], PLEASE CANCEL APPT IN FEB WITH DR.  Caryl Comes

## 2010-12-21 NOTE — Assessment & Plan Note (Signed)
Summary: Device change out    PPM Specifications Following MD:  Virl Axe, MD     PPM Vendor:  Biotronik     PPM Model Number:  [id number]     PPM Serial Number:  [id number] PPM DOI:  07/24/2001     PPM Implanting MD:  Virl Axe, MD  Lead 1    Location: RA     DOI: 07/24/2001     Model #: [id number]     Serial #: [id number]     Status: active Lead 2    Location: RV     DOI: 07/24/2001     Model #: [id number]     Serial #: [id number]     Status: active  Magnet Response Rate:  BOL 10BTS @ 90 -PR ERI  10BTS @ 80-11% PR  Indications:  Syncope  Explantation Comments:  01/21/2010 Biotronik [id number] explanted  PPM Follow Up Pacer Dependent:  Yes      Episodes Coumadin:  No  Parameters Mode:  DDDR     Lower Rate Limit:  60     Upper Rate Limit:  130 Paced AV Delay:  300      ICD Specifications Following MD:  Virl Axe, MD     ICD Vendor:  St Jude     ICD Model Number:  [phone]     ICD Serial Number:  [id number] ICD DOI:  01/21/2010     ICD Implanting MD:  Virl Axe, MD  Lead 1:    Location: RV     DOI: 01/21/2010     Model #: [id number]     Serial #: [id number]     Status: active

## 2010-12-21 NOTE — Cardiovascular Report (Signed)
Summary: TTM   TTM   Imported By: Sallee Provencal 11/26/2009 11:52:59  _____________________________________________________________________  External Attachment:    Type:   Image     Comment:   External Document

## 2010-12-21 NOTE — Letter (Signed)
Summary: Cardiac Catheterization Instructions- [place], [place]  [id number] [address]. [address] [place]   [place], [place] [address]   Phone: [phone]  Fax: [phone]     12/10/2009 MRN: [id number]  JETON SIBBETT [address] [place],   [id number]  Dear Mr. RIGLER,   You are scheduled for a Cardiac Catheterization on Wed. 12/16/09 with Dr.Stuckey  Please arrive to the 1st floor of the Heart and Vascular Center at [facility] at 7:30 am / pm on the day of your procedure. Please do not arrive before 6:30 a.m. Call the Heart and Vascular Center at [phone] if you are unable to make your appointmnet. The Code to get into the parking garage under the building is 0090. Take the elevators to the 1st floor. You must have someone to drive you home. Someone must be with you for the first 24 hours after you arrive home. Please wear clothes that are easy to get on and off and wear slip-on shoes. Do not eat or drink after midnight except water with your medications that morning. Bring all your medications and current insurance cards with you.  _X__ DO NOT take these medications before your procedure:   Hold your Diovan/hct AM of test   You will also need to be off your coumadin, Threasa Beards will call you regarding exactly when to stop taking coumadin.  ___ Make sure you take your aspirin.  ___ You may take ALL of your medications with water that morning. ________________________________________________________________________________________________________________________________  ___ DO NOT take ANY medications before your procedure.  ___ Pre-med instructions:  ________________________________________________________________________________________________________________________________  The usual length of stay after your procedure is 2 to 3 hours. This can vary.  If you have any questions, please call the office at the number listed above.   Kevan Rosebush,  RN  Appended Document: Cardiac Catheterization Instructions- [facility] Instructions reviewed w/pt via phone, copy mailed to pt, he will come on Tue 1/25 for labwork, he is aware that Threasa Beards will call him on Fri 1/21 w/instructions about his coumadin  Appended Document: Cardiac Catheterization Instructions- [facility]    Clinical Lists Changes  Problems: Added new problem of CARDIOVASCULAR STUDIES, ABNORMAL (ICD-794.30) Orders: Added new Referral order of Cardiac Catheterization (Cardiac Cath) - Signed      Appended Document: Cardiac Catheterization Instructions- [facility] Pt advised to hold Coumadin 5 days.

## 2010-12-21 NOTE — Progress Notes (Signed)
Summary: speak to [facility]  Phone Note Call from Patient Call back at [facility] Phone [phone]   Caller: Patient Reason for Call: Talk to Nurse Summary of Call: request to speak to [facility] about his pacemaker Initial call taken by: Darnell Level,  February 19, 2010 1:38 PM  Follow-up for Phone Call        Spoke with patient, still has not recieved Merlin box- ordered, also asked when it was okay to swim at the [facility]. Advised 6 weeks after implant.  Pt aware and agrees with plan. Chanetta Marshall RN BSN  February 19, 2010 4:36 PM

## 2010-12-21 NOTE — Progress Notes (Signed)
Summary: lab order  Phone Note Call from Patient Call back at Home Phone [phone]   Caller: Patient Reason for Call: Talk to Nurse Summary of Call: calling about blood.... no appt in computer, he was told to come in on tues, does he need to fast? Initial call taken by: Darnell Level,  December 14, 2009 11:09 AM  Follow-up for Phone Call        Labs placed in [place], pt aware.  Follow-up by: Barnett Abu, RN, BSN,  December 14, 2009 12:39 PM

## 2010-12-21 NOTE — Cardiovascular Report (Signed)
Summary: Office Visit   Office Visit   Imported By: Sallee Provencal 01/28/2010 16:11:36  _____________________________________________________________________  External Attachment:    Type:   Image     Comment:   External Document

## 2010-12-21 NOTE — Letter (Signed)
Summary: [facility]   Imported By: Jamelle Haring 01/12/2010 10:08:40  _____________________________________________________________________  External Attachment:    Type:   Image     Comment:   External Document

## 2010-12-21 NOTE — Miscellaneous (Signed)
Summary: Outpatient Coinsurance Notice  Outpatient Coinsurance Notice   Imported By: Marilynne Drivers 12/01/2009 15:56:49  _____________________________________________________________________  External Attachment:    Type:   Image     Comment:   External Document

## 2010-12-21 NOTE — Cardiovascular Report (Signed)
Summary: Pre Cath Orders  Pre Cath Orders   Imported By: Sallee Provencal 12/17/2009 15:55:53  _____________________________________________________________________  External Attachment:    Type:   Image     Comment:   External Document

## 2010-12-23 NOTE — Procedures (Signed)
Summary: Cardiology Device Clinic   Allergies: No Known Drug Allergies  PPM Specifications Following MD:  Virl Axe, MD     PPM Vendor:  Biotronik     PPM Model Number:  [id number]     PPM Serial Number:  [id number] PPM DOI:  07/24/2001     PPM Implanting MD:  Virl Axe, MD  Lead 1    Location: RA     DOI: 07/24/2001     Model #: [id number]     Serial #: [id number]     Status: active Lead 2    Location: RV     DOI: 07/24/2001     Model #: [id number]     Serial #: [id number]     Status: active  Magnet Response Rate:  BOL 10BTS @ 90 -PR ERI  10BTS @ 80-11% PR  Indications:  Syncope  Explantation Comments:  01/21/10 Biotronik [id number] explanted.  PPM Follow Up Pacer Dependent:  Yes      Episodes Coumadin:  No  Parameters Mode:  DDDR     Lower Rate Limit:  60     Upper Rate Limit:  130 Paced AV Delay:  300      ICD Specifications Following MD:  Virl Axe, MD     ICD Vendor:  St Jude     ICD Model Number:  [id number]     ICD Serial Number:  [id number] ICD DOI:  01/21/2010     ICD Implanting MD:  Thompson Grayer, MD  Lead 1:    Location: RV     DOI: 01/21/2010     Model #: [id number]     Serial #: [id number]     Status: active Lead 2:    Location: RA     DOI: 08/20/2001     Model #: [id number]     Serial #: [id number]     Status: active Lead 3:    Location: RV     DOI: 08/20/2001     Model #: [id number]     Serial #: [id number]     Status: capped  Indications::  CM   ICD Follow Up Remote Check?  No Charge Time:  9.1 seconds     Battery Est. Longevity:  7.6 years Underlying rhythm:  dependent ICD Dependent:  Yes       ICD Device Measurements Atrium:  Amplitude: 4.3 mV, Impedance: 410 ohms, Threshold: 0.75 V at 0.5 msec Right Ventricle:  Impedance: 400 ohms, Threshold: 0.5 V at 0.5 msec Shock Impedance: 47 ohms   Episodes MS Episodes:  4     Percent Mode Switch:  <1%     Coumadin:  No Shock:  0     ATP:  0     Nonsustained:  0     Atrial Pacing:  15%     Ventricular Pacing:  100%  Brady Parameters Mode  DDD     Lower Rate Limit:  60     Upper Rate Limit 120 PAV 190     Sensed AV Delay:  170  Tachy Zones VF:  200     VT:  171     Next Remote Date:  02/24/2011     Next Cardiology Appt Due:  11/22/2011 Tech Comments:  No parameter changes.  Device function normal.  Merlin transmissions every 3 months.  ROV 1 year with Dr. Lequita Halt in [place]. [place] Friendly, LPN  January  3, [id number] 10:26 AM

## 2011-02-06 LAB — POCT I-STAT GLUCOSE: Glucose, Bld: 105 mg/dL — ABNORMAL HIGH (ref 70–99)

## 2011-02-13 LAB — PROTIME-INR
INR: 1.87 — ABNORMAL HIGH (ref 0.00–1.49)
INR: 2.12 — ABNORMAL HIGH (ref 0.00–1.49)
Prothrombin Time: 23.6 seconds — ABNORMAL HIGH (ref 11.6–15.2)

## 2011-02-13 LAB — CARDIAC PANEL(CRET KIN+CKTOT+MB+TROPI)
CK, MB: 3.4 ng/mL (ref 0.3–4.0)
CK, MB: 4.3 ng/mL — ABNORMAL HIGH (ref 0.3–4.0)
Relative Index: 2.8 — ABNORMAL HIGH (ref 0.0–2.5)
Relative Index: 3.4 — ABNORMAL HIGH (ref 0.0–2.5)
Total CK: 125 U/L (ref 7–232)
Troponin I: 0.03 ng/mL (ref 0.00–0.06)
Troponin I: 0.07 ng/mL — ABNORMAL HIGH (ref 0.00–0.06)

## 2011-02-13 LAB — CBC
HCT: 42.8 % (ref 39.0–52.0)
Platelets: 192 10*3/uL (ref 150–400)
RDW: 14.1 % (ref 11.5–15.5)
WBC: 8.4 10*3/uL (ref 4.0–10.5)

## 2011-02-13 LAB — DIFFERENTIAL
Basophils Absolute: 0.1 10*3/uL (ref 0.0–0.1)
Lymphocytes Relative: 20 % (ref 12–46)
Lymphs Abs: 1.7 10*3/uL (ref 0.7–4.0)
Neutro Abs: 5.6 10*3/uL (ref 1.7–7.7)

## 2011-02-13 LAB — BASIC METABOLIC PANEL
BUN: 16 mg/dL (ref 6–23)
Chloride: 105 mEq/L (ref 96–112)
GFR calc non Af Amer: >60 mL/min (ref >60)
Glucose, Bld: 111 mg/dL — ABNORMAL HIGH (ref 70–99)
Potassium: 3.7 mEq/L (ref 3.5–5.1)
Sodium: 141 mEq/L (ref 135–145)

## 2011-02-13 LAB — APTT: aPTT: 36 seconds (ref 24–37)

## 2011-02-13 LAB — GLUCOSE, CAPILLARY
Glucose-Capillary: 108 mg/dL — ABNORMAL HIGH (ref 70–99)
Glucose-Capillary: 137 mg/dL — ABNORMAL HIGH (ref 70–99)

## 2011-02-13 LAB — MRSA PCR SCREENING: MRSA by PCR: NEGATIVE

## 2011-02-13 LAB — MAGNESIUM: Magnesium: 2 mg/dL (ref 1.5–2.5)

## 2011-02-13 LAB — TSH: TSH: 0.665 u[IU]/mL (ref 0.350–4.500)

## 2011-02-24 ENCOUNTER — Ambulatory Visit (INDEPENDENT_AMBULATORY_CARE_PROVIDER_SITE_OTHER): Payer: Medicare Other | Admitting: *Deleted

## 2011-02-24 DIAGNOSIS — Z95810 Presence of automatic (implantable) cardiac defibrillator: Secondary | ICD-10-CM

## 2011-02-24 DIAGNOSIS — I442 Atrioventricular block, complete: Secondary | ICD-10-CM

## 2011-02-24 DIAGNOSIS — R0989 Other specified symptoms and signs involving the circulatory and respiratory systems: Secondary | ICD-10-CM

## 2011-02-24 DIAGNOSIS — I428 Other cardiomyopathies: Secondary | ICD-10-CM

## 2011-02-28 ENCOUNTER — Other Ambulatory Visit: Payer: Self-pay

## 2011-02-28 NOTE — Progress Notes (Signed)
icd remote check  

## 2011-03-13 ENCOUNTER — Encounter: Payer: Self-pay | Admitting: *Deleted

## 2011-04-05 NOTE — Assessment & Plan Note (Signed)
[place] OFFICE NOTE   Caleb, Lyons                        MRN:          [id number]  DATE:05/10/2010                            DOB:          June 04, 1930    Caleb Lyons seen in followup for the device previously implanted for  complete heart block.  He had a pacemaker previously implanted for  complete heart block.  He has had intercurrent worsening of LV function  and as demonstrated by Myoview scanning and hence underwent  catheterization, the last 6 months demonstrating patency of his previous  grafting.   While I was away, he ended up reaching ERI and underwent device  generator replacement receiving a dual-chamber defibrillator.  He did  not receive an LV lead.  He had no significant symptoms of exercise  intolerance.   He has done really very well since then.  He says I feel as well as I  did when you first put in the pacemaker.   His medications currently include Lipitor, Synthroid, Diovan HCT 160/25,  Toprol 50, warfarin, Flomax, metformin, aspirin.   PHYSICAL EXAMINATION:  VITAL SIGNS:  His blood pressure 107/70, his  pulse was 80, his weight was 337, which unfortunately is stable.  LUNGS:  Clear.  HEART:  Sounds were regular.  The device pocket was well-healed.  EXTREMITIES:  No edema.   Interrogation of his [place] ICD demonstrates a P-wave of 5  with an impedance of 450, a threshold 0.75-0.5.  The R-wave was not  discernible with a threshold of 0.625 with an impedance of 460.  High-  voltage impedance was 48 ohms.  Heart rate excursion was adequate.  There was no intercurrent therapies.   IMPRESSION:  1. Complete heart blocks with a previously implanted pacemaker, more      recently upgraded to a dual-chamber defibrillator.  2. Ischemic heart disease with      a.     Prior bypass with patent grafts.      b.     Chronic worsening with ejection fraction of 33%.  3.  Atrial fibrillation - paroxysmal - remote.  4. Morbid obesity.  5. Class II symptoms.   Caleb Lyons has a new defibrillator and has restoration of AV synchrony  associated with vast improvement in his symptoms.  He does not have an  LV lead.   The other issue is whether ongoing Coumadin therapy is appropriate.  There is no atrial fibrillation detected on his pacemaker dating back to  7 years.  I will discuss with Dr. Ardeen Jourdain whether it is reasonable to  discontinue his Coumadin and maintain him on aspirin.     Caleb Sprang, MD, [facility]  Electronically Signed    SCK/MedQ  DD: 05/10/2010  DT: 05/11/2010  Caleb #: [id number]   cc:   Fuller Canada, MD

## 2011-04-05 NOTE — Assessment & Plan Note (Signed)
[place] C[facility]IOLOG[facility] OFFICE NOTE   Caleb, Lyons                        MRN:          [id number]  DATE:11/23/2010                            DOB:          08/25/1930    Caleb Lyons is seen in followup for complete heart block and has  ischemic heart disease with prior bypass grafting and modest depression  of left ventricular systolic function.  He denies intercurrent problems  with exercise intolerance or shortness of breath.  He did have a  complicated hospitalization for cellulitis in the setting of bilateral  peripheral edema.  This was cured with antibiotics and diuresis.   Myoview scanning catheterization the last year demonstrated patency of  graft to deterioration of LVEF with an estimation about 30-35%.   Medications are unchanged except for the discontinuation of warfarin.   PHYSICAL EXAMINATION:  VITAL SIGNS:  His weight is stable at 339 pounds,  his blood pressure is 124/72 his pulse is 67.  LUNGS:  Clear.  HEART:  Heart sounds are regular.  ABDOMEN:  Protuberant, but soft.  EXTREMITIES:  Had mild edema.  NEUROLOGICAL:  Grossly normal.  SKIN:  Warm and dry.   Interrogation of his St. Jude 4-5 pulse generator demonstrates no  intrinsic ventricular rhythm with impedance of 400, threshold 0.5-0.5,  the P-wave is 4.2 with a pace impedance of 4.2, a threshold 0.7-0.5,  longest atrial fibrillation mode switch episode was 10 seconds.   IMPRESSION:  1. Complete heart block.  2. Status post pacer for the above.  3. Ischemic heart disease.      a.     Prior bypass grafting with patent grafts.      b.     Decreased ejection fraction.  4. Paroxysmal atrial fibrillation  - remote with ongoing monitoring      demonstrating no significant recurrence of atrial fibrillation.  5. Morbid obesity.   As the patient has had no atrial fibrillation over now 8 years, we will  continue to follow him with aspirin.   His symptoms do not dictate  further consideration of upgrade of his device at this time.  He is  again encouraged to lose weight.     Deboraha Sprang, MD, [facility]     SCK/MedQ  DD: 11/23/2010  DT: 11/23/2010  Job #: [id number]   cc:   Fuller Canada, MD

## 2011-04-05 NOTE — Assessment & Plan Note (Signed)
[place] OFFICE NOTE   VISHAAN, TURENNE                          MRN:          [id number]  DATE:04/23/2007                            DOB:          1930-02-06    DEVICE CLINIC NOTE.   Mr. Laessig was seen today in the [facility] for followup of his  Biotronic pacemaker implanted in September of 2002 for syncope.  Interrogation of his device demonstrates P waves of 6.3 mV with an  atrial impedance of 426 ohms and a threshold of 0.8 volts at 0.4  milliseconds.  His R waves measured 13.5 to 16.6 mV with an RV impedance  of 682 ohms and a threshold of 1.8 volts at 0.5 milliseconds.  His  battery voltage was 2.74 with an estimated longevity of 1 year and 11  months.  He was in normal sinus rhythm.  He is programmed DDDR with a  lower rate of 60 and an upper rate of 130 with a dynamic AV delay.  Mr.  Wolfgramm ventricular output today was increased to 3 volts at 0.5  milliseconds.  He does TTMs and will return to clinic in 1 year with Dr.  Caryl Comes.      Chanetta Marshall, RN,BSN  Electronically Signed      Deboraha Sprang, MD, [facility]  Electronically Signed   AS/MedQ  DD: 04/23/2007  DT: 04/23/2007  Job #: [id number]

## 2011-04-05 NOTE — Letter (Signed)
November 07, 2008    Reita Cliche. [place], MD  [address]  [place], [place] [address]   RE:  Caleb Lyons, Caleb Lyons  MRN:  [id number]  /  DOB:  07-Oct-1930   Dear Merrie Roof:   Caleb Lyons comes in today in followup for his pacemaker implanted for  syncope and bifascicular block.  He has now progressed to complete heart  block in his device dependent.  He has no complaints of chest pain or  shortness of breath.  He does have a history of ischemic heart disease  with prior bypass surgery, and preserved normal left ventricular  function.   MEDICATIONS:  1. Lipitor 40.  2. Toprol 50.  3. Diovan HCT 80/12.5.  4. Synthroid 175 mcg.  5. Avodart 0.5.  6. Flomax 0.4.  7. Aspirin 81.  8. Coumadin.   PHYSICAL EXAMINATION:  VITAL SIGNS:  His blood pressure today was  124/73; his pulse was 71; and his weight was 372 pounds, which is up 50  pounds in the last 2 years.  LUNGS:  Clear.  HEART:  Sounds were regular.  The device site is well healed.  ABDOMEN:  Soft.  EXTREMITIES:  Without edema.   Interrogation of his Biotronik device demonstrating that his P-wave was  5.9 with impedance of 501 and a threshold of 1.2 at 0.4.  There was no  intrinsic ventricular rhythm.  The impedance of 49 and the threshold of  1.3 at 0.4.  Battery voltage was 2.69, and he has impending device  battery depletion that is reaching ERI.   Merrie Roof, if you would mind undertaking a 2-D echo when you see him next  week, that would make sure that we have not had a significant change and  the implanted device does not need to be considered to be different.   We will plan to check him again in 1 month's time and then we will see  him again in 2 months as he approaches ERI.      Sincerely,      Deboraha Sprang, MD, [facility]  Electronically Signed    SCK/MedQ  DD: 11/07/2008  DT: 11/07/2008  Job #: [id number]

## 2011-04-05 NOTE — Assessment & Plan Note (Signed)
[place] OFFICE NOTE   AKASH, HEBDON                          MRN:          [id number]  DATE:01/27/2009                            DOB:          Sep 06, 1930    Mr.Pariso was seen in followup for pacemaker implanted for  syncopeassociated w bi-fascicular block.  He has had no recurrent  syncope.  He is now device-dependent.   He denies complaints of shortness of breath, edema, or chest pain.   He is working out 3 times a week for 2 hours a day.  He is working on  his diet.  He has lost 8 pounds over the last 3 months.   MEDICATIONS:  1. Lipitor 40.  2. Toprol 50.  3. Synthroid 175 mcg.  4. Coumadin.  5. Avodart.  6. Flomax 0.4.  7. Aspirin.  8. Diovan 80/25.   PHYSICAL EXAMINATION:  VITAL SIGNS:  His blood pressure today was  126/82, his pulse was 80, and his weight was as noted.  LUNGS:  Clear.  HEART:  Sounds were regular.  ABDOMEN:  Soft.  EXTREMITIES:  Without edema.   Interrogation of his Biotronik device demonstrates that he has  approximately 9 months of longevity.  His P-wave was 6.  The impedance  was 527.  The threshold was 1.2 at 0.4.  There was no intrinsic  ventricular rhythm.  The impedance was 455.  The threshold 1.4 at 0.4.   IMPRESSION:  1. Complete heart block.  2. Syncope previously related to complete heart block.  3. Status post pacer for the above.  4. Obesity with recent intercurrent weight loss with significant      effort.  5. Hypertension - treated.  6. Coumadin therapy, question indication.  7. Obstructive sleep apnea on therapy.   Mr. Lovena Le is doing quite well.  I reviewed his echo, which he had done  last week, which showed normal left ventricular function and some left  atrial enlargement.  We will plan to see him again in 6 months' time.  I  have encouraged him to continue on weight loss program.     Deboraha Sprang, MD, [facility]  Electronically  Signed    SCK/MedQ  DD: 01/27/2009  DT: 01/27/2009  Job #: [id number]   cc:   Reita Cliche. Revankar, M.D.

## 2011-04-08 NOTE — Procedures (Signed)
[place]  Patient:    Caleb Lyons, Caleb Lyons Visit Number: [id number] MRN: [id number]          Service Type: CAT Location: [id number] Attending Physician:  Nikki Dom Dictated by:   Nikki Dom, M.D., [facility] [place]. Date: 07/24/01 Admit Date:  07/24/2001   CC:         Electrophysiology Laboratory  Quimby R. Revankar, M.D.  Velora Heckler, [facility]   Procedure Report  PREOPERATIVE DIAGNOSIS:  Syncope with bifascicular block and high intermittent second degree atrioventricular block.  POSTOPERATIVE DIAGNOSIS:   Syncope with bifascicular block and high intermittent second degree atrioventricular block.  PROCEDURES: 1. Contrast venography. 2. Dual-chamber pacemaker implantation.  DESCRIPTION OF PROCEDURE:  Following the obtaining of informed consent, Caleb Lyons was brought to the electrophysiology laboratory and placed on the fluoroscopic table in the supine position.  After routine prep and drape of the left upper chest, intravenous contrast was injected via the left antecubital vein to identify the course and the patency of the extrathoracic left subclavian vein.  This having been accomplished, lidocaine was infiltrated in the prepectoral subclavicular region.  The incision was made and carried down to the layer of the prepectoral fascia using electrocautery. A pocket was formed using the electrocautery.  Hemostasis was obtained.  Thereafter attention was turned to gaining access to the extrathoracic left subclavian vein, which was accomplished with modest difficulty.  The subclavian artery was punctured once, and a single venipuncture was then accomplished.  A guidewire was placed and retained, and a 0 silk suture was placed in a figure-of-eight fashion and allowed to hang loosely.  A 9 French hemostatic introducer sheath was placed, through which was passed a Pacesetter 1488TC, 58 cm active-fixation ventricular  lead, serial number [id number].  Under fluoroscopic guidance it was manipulated to the right ventricular apex, which notably was quite rotated.  The bipolar R-wave was 14 millivolts with a pacing impedance of 694 Ohms and a pacing threshold of 0.5 milliseconds at 0.9 volts, a current of 5 volts at 1.8 MA, and there was no diaphragmatic pacing at 10 volts.  A guidewire was placed through the sheath, the sheath was removed, and the lead was then secured to the prepectoral fascia.  Over the retained guidewire a hemostatic 7 French sheath was placed, through which was passed a Pacesetter 1488TC, 52 cm lead, serial number [id number].  Prior to insertion of this lead, the ventricular lead was marked with a tie.  Under fluoroscopic guidance this lead was manipulated to the right atrial appendage remnant, where the bipolar P-wave was 2 millivolts with a pacing impedance of 608 Ohms and a pacing threshold of 0.5 milliseconds at 1.3 volts, a currented threshold of 2.4 MA.  With these acceptable parameters recorded, this lead was then secured to the prepectoral fascia.  The leads were then attached to a Biotronik Philos DR-ACC pulse generator, serial number [id number]. P-synchronous pacing was identified.  The pocket was copiously irrigated with a saline solution and hemostasis was assured.  The leads and the pulse generator were then placed in the pocket, secured to the prepectoral fascia, and the wound was closed in three layers in the normal fashion.  The wound was washed, dried, and a benzoin and Steri-Strip dressing was applied, and needle counts, sponge counts, and instrument counts were correct at the end of the procedure according to the staff. Dictated by:   Nikki Dom, M.D., [facility] Attending Physician:  Virl Axe  [place] DD:  07/24/01 TD:  07/24/01 Job: [id number]

## 2011-04-08 NOTE — Discharge Summary (Signed)
[place]. [facility]  Patient:    Caleb Lyons, Caleb Lyons Visit Number: [id number] MRN: [id number]          Service Type: CAT Location: [id number] Attending Physician:  Nikki Dom Dictated by:   Forest Becker, N.P. Admit Date:  07/24/2001 Discharge Date: 07/25/2001   CC:         Reita Cliche. Revankar, M.D., [place], [place]  Nikki Dom, M.D., [facility], [facility]   Discharge Summary  PRIMARY DIAGNOSIS:  Syncope.  SECONDARY DIAGNOSIS:  Hypothyroidism.  HISTORY OF PRESENT ILLNESS:  This is a 77 year old gentleman who has ischemia cardiac disease status post bypass x 5 in 2000 by Dr. 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  The patient had an episode of [place] [facility]  He submitted to a treadmill test and showed the development of a 2:1 block at peak exercise.  He has had no other frank syncope since August 11 and exercise tolerance is good, a part from recent episodes, but he has not been exercising since his most recent episode of syncope.  He was admitted for placement of a Biotronik dual-chamber pacemaker.  He underwent placement of a Biotronik dual-chamber pacemaker on July 24, 2001.  He tolerated the procedure well.  He had an uneventful postop course and was discharged to home the following day.  DISCHARGE MEDICATIONS: 1. Toprol XL 50 daily. 2. Lipitor 40 daily. 3. Synthroid 0.1 daily. 4. Avapro 150 daily. 5. Hydrochlorothiazide 12.5 daily. 6. Multivitamin and vitamin C as before.  DISCHARGE INSTRUCTIONS:  He was to follow up with Dr. Geraldo Pitter within 10 days for a wound check.  He was instructed to not do any heavy lifting or strenuous activity with his left arm for the next 4-6 weeks.  He was not to drive for the next two weeks.  The patient was instructed not to shower or get his  wound wet for the next week and he was to gradually raise his left arm as instructed on his discharge summary.  He was to call the office if he had any drainage or developed a lump at his wound.  He was to follow up at the pacemaker clinic on October 7 at 9 a.m. and follow with Dr. Geraldo Pitter within 10 days. Dictated by:   Forest Becker, N.P. Attending Physician:  Nikki Dom DD:  07/25/01 TD:  07/25/01 Job: [id number]

## 2011-05-17 ENCOUNTER — Encounter: Payer: Self-pay | Admitting: Cardiovascular Disease

## 2011-05-26 ENCOUNTER — Encounter: Payer: Self-pay | Admitting: Internal Medicine

## 2011-05-26 ENCOUNTER — Other Ambulatory Visit: Payer: Self-pay | Admitting: Internal Medicine

## 2011-05-26 ENCOUNTER — Ambulatory Visit (INDEPENDENT_AMBULATORY_CARE_PROVIDER_SITE_OTHER): Payer: Medicare Other | Admitting: *Deleted

## 2011-05-26 DIAGNOSIS — I442 Atrioventricular block, complete: Secondary | ICD-10-CM

## 2011-05-26 DIAGNOSIS — I428 Other cardiomyopathies: Secondary | ICD-10-CM

## 2011-05-26 LAB — REMOTE ICD DEVICE
AL IMPEDENCE ICD: 430 Ohm
AL THRESHOLD: 0.875 V
BAMS-0003: 70 {beats}/min
CHARGE TIME: 9.4 s
DEVICE MODEL ICD: 600199
MODE SWITCH EPISODES: 3
RV LEAD IMPEDENCE ICD: 360 Ohm
RV LEAD THRESHOLD: 0.625 V
TZON-0003SLOWVT: 350 ms
TZON-0004SLOWVT: 24

## 2011-06-01 ENCOUNTER — Encounter: Payer: Self-pay | Admitting: *Deleted

## 2011-06-03 NOTE — Progress Notes (Signed)
Pacer remote check  

## 2011-08-25 ENCOUNTER — Ambulatory Visit (INDEPENDENT_AMBULATORY_CARE_PROVIDER_SITE_OTHER): Payer: Medicare Other | Admitting: *Deleted

## 2011-08-25 DIAGNOSIS — I442 Atrioventricular block, complete: Secondary | ICD-10-CM

## 2011-08-26 ENCOUNTER — Other Ambulatory Visit: Payer: Self-pay | Admitting: Internal Medicine

## 2011-08-26 ENCOUNTER — Encounter: Payer: Self-pay | Admitting: Internal Medicine

## 2011-08-26 LAB — REMOTE ICD DEVICE
ATRIAL PACING ICD: 18 pct
BAMS-0001: 150 {beats}/min
BAMS-0003: 70 {beats}/min
DEV-0020ICD: NEGATIVE
HV IMPEDENCE: 47 Ohm
MODE SWITCH EPISODES: 3
TZON-0005SLOWVT: 6

## 2011-08-30 ENCOUNTER — Encounter: Payer: Self-pay | Admitting: *Deleted

## 2011-09-05 NOTE — Progress Notes (Signed)
icd remote check  

## 2011-09-08 ENCOUNTER — Telehealth: Payer: Self-pay | Admitting: Internal Medicine

## 2011-09-08 NOTE — Telephone Encounter (Signed)
I spoke with Caleb Lyons and made her aware I would review with Dr. Caryl Comes and call her back next week.

## 2011-09-08 NOTE — Telephone Encounter (Signed)
Pt saw Dr. Lin Landsman yesterday and would like Viagra.  Dr. Lin Landsman wants to make sure that it is ok with Dr. Caryl Comes for pt to have.  Please call Marita [place] back

## 2011-09-12 NOTE — Telephone Encounter (Signed)
He has no cardiac contraindications ro viagra Thanks steve Tashawn Laswell

## 2011-09-13 NOTE — Telephone Encounter (Signed)
Caleb Lyons is aware of Dr. Olin Pia recommendations.

## 2011-11-21 ENCOUNTER — Encounter: Payer: Self-pay | Admitting: *Deleted

## 2011-11-25 DIAGNOSIS — M25569 Pain in unspecified knee: Secondary | ICD-10-CM | POA: Diagnosis not present

## 2011-11-29 ENCOUNTER — Other Ambulatory Visit: Payer: Medicare Other

## 2011-11-29 ENCOUNTER — Encounter: Payer: Self-pay | Admitting: Internal Medicine

## 2011-11-29 ENCOUNTER — Ambulatory Visit (INDEPENDENT_AMBULATORY_CARE_PROVIDER_SITE_OTHER): Payer: Medicare Other | Admitting: Endocrinology

## 2011-11-29 ENCOUNTER — Ambulatory Visit (INDEPENDENT_AMBULATORY_CARE_PROVIDER_SITE_OTHER): Payer: Medicare Other | Admitting: Internal Medicine

## 2011-11-29 ENCOUNTER — Encounter: Payer: Self-pay | Admitting: Endocrinology

## 2011-11-29 VITALS — BP 148/84 | HR 84 | Temp 98.0°F | Ht 73.0 in | Wt 380.0 lb

## 2011-11-29 VITALS — BP 168/96 | HR 81 | Ht 73.0 in | Wt 383.0 lb

## 2011-11-29 DIAGNOSIS — I442 Atrioventricular block, complete: Secondary | ICD-10-CM | POA: Insufficient documentation

## 2011-11-29 DIAGNOSIS — Z95810 Presence of automatic (implantable) cardiac defibrillator: Secondary | ICD-10-CM

## 2011-11-29 DIAGNOSIS — E210 Primary hyperparathyroidism: Secondary | ICD-10-CM

## 2011-11-29 DIAGNOSIS — I255 Ischemic cardiomyopathy: Secondary | ICD-10-CM | POA: Insufficient documentation

## 2011-11-29 DIAGNOSIS — I2589 Other forms of chronic ischemic heart disease: Secondary | ICD-10-CM | POA: Diagnosis not present

## 2011-11-29 DIAGNOSIS — I10 Essential (primary) hypertension: Secondary | ICD-10-CM | POA: Diagnosis not present

## 2011-11-29 DIAGNOSIS — E669 Obesity, unspecified: Secondary | ICD-10-CM | POA: Insufficient documentation

## 2011-11-29 DIAGNOSIS — G4733 Obstructive sleep apnea (adult) (pediatric): Secondary | ICD-10-CM | POA: Insufficient documentation

## 2011-11-29 LAB — ICD DEVICE OBSERVATION
AL THRESHOLD: 0.625 V
ATRIAL PACING ICD: 13 pct
BAMS-0001: 150 {beats}/min
BAMS-0003: 70 {beats}/min
DEV-0020ICD: NEGATIVE
DEVICE MODEL ICD: 600199
FVT: 0
MODE SWITCH EPISODES: 23
PACEART VT: 0
RV LEAD AMPLITUDE: 5.8 mv
RV LEAD THRESHOLD: 0.625 V
TOT-0006: 20110303000000
TOT-0009: 1
TZON-0003SLOWVT: 350 ms
VENTRICULAR PACING ICD: 99.82 pct

## 2011-11-29 NOTE — Assessment & Plan Note (Signed)
As above.

## 2011-11-29 NOTE — Assessment & Plan Note (Signed)
The patient's device was interrogated.  The information was reviewed. No changes were made in the programming.    

## 2011-11-29 NOTE — Assessment & Plan Note (Signed)
This is poorly controlled. I've asked him to followup with his sleep apnea physician is as it has been many years since this was adjusted. It may well be that there is some benefit to be gained here. If further therapy is needed, I would use Aldactone.

## 2011-11-29 NOTE — Patient Instructions (Signed)
You have a mild case of "primary hyperparathyroidism."   For now, it needs no treatment.   blood tests are being requested for you today.  please call [phone] to hear your test results.  You will be prompted to enter the 9-digit "MRN" number that appears at the top left of this page, followed by #.  Then you will hear the message. Here is a form to have a recheck in approx 6 months. Please return in 1 year.

## 2011-11-29 NOTE — Patient Instructions (Signed)
Your physician has recommended you make the following change in your medication:  1) Decrease Aspirin to 81 mg once daily.  Remote monitoring is used to monitor your Pacemaker of ICD from home. This monitoring reduces the number of office visits required to check your device to one time per year. It allows [place] to keep an eye on the functioning of your device to ensure it is working properly. You are scheduled for a device check from home on 03/01/12. You may send your transmission at any time that day. If you have a wireless device, the transmission will be sent automatically. After your physician reviews your transmission, you will receive a postcard with your next transmission date.   Your physician wants you to follow-up in: 1 year with Dr. Caryl Comes. You will receive a reminder letter in the mail two months in advance. If you don't receive a letter, please call our office to schedule the follow-up appointment.

## 2011-11-29 NOTE — Assessment & Plan Note (Signed)
The patient is relatively stable. At our next visit, we will plan to add spironolactone.

## 2011-11-29 NOTE — Assessment & Plan Note (Signed)
Device dependent.

## 2011-11-29 NOTE — Progress Notes (Signed)
Subjective:    Patient ID: Caleb Lyons, male    DOB: 12-Jul-1930, 78 y.o.   MRN: [id number]  HPI Pt was noted on recent routine labs to have elev ca++ level.  He has a few years of moderate pain at the knees, and assoc weight gain.  No recent bony fracture.  No h/o urolithiasis.   Past Medical History  Diagnosis Date  . Complete heart block   . Ischemic cardiomyopathy     s/pCABG, Graft patent 3/11; EF 30-35%  . PAF (paroxysmal atrial fibrillation)   . Obesity        . Sinus node dysfunction   . HTN (hypertension)   . Impotence of organic origin   . Hyperparathyroidism   . Dual implantable cardiac defibrillator in situ     [employer]  . OSA (obstructive sleep apnea)   . History of chicken pox   . Type II or unspecified type diabetes mellitus without mention of complication, not stated as uncontrolled   . HLD (hyperlipidemia)     Past Surgical History  Procedure Date  . Coronary artery bypass graft   . Tonsillectomy     History   Social History  . Marital Status: Married    Spouse Name: N/A    Number of Children: N/A  . Years of Education: 16   Occupational History  . Retired    Social History Main Topics  . Smoking status: Never Smoker   . Smokeless tobacco: Never Used  . Alcohol Use: Yes  . Drug Use: No  . Sexually Active: Not on file   Other Topics Concern  . Not on file   Social History Narrative   Regular exercise-yes    Current Outpatient Prescriptions on File Prior to Visit  Medication Sig Dispense Refill  . aspirin EC 81 MG tablet Take 1 tablet (81 mg total) by mouth daily.  150 tablet  2  . atorvastatin (LIPITOR) 40 MG tablet Take 40 mg by mouth daily.        . Dutasteride-Tamsulosin HCl (JALYN) 0.5-0.4 MG CAPS Take 1 capsule by mouth daily.        Marland Kitchen levothyroxine (SYNTHROID, LEVOTHROID) 175 MCG tablet Take 175 mcg by mouth daily.        . metoprolol (TOPROL-XL) 50 MG 24 hr tablet Take 50 mg by mouth daily.        . Multiple Vitamin  (MULTIVITAMIN) tablet Take 1 tablet by mouth daily.        . valsartan-hydrochlorothiazide (DIOVAN-HCT) 160-25 MG per tablet Take 1 tablet by mouth daily.        . vitamin C (ASCORBIC ACID) 500 MG tablet Take 500 mg by mouth daily.          No Known Allergies  Family History  Problem Relation Age of Onset  . Early death Father   . Stroke Father     BP 148/84  Pulse 84  Temp(Src) 98 F (36.7 C) (Oral)  Ht 6\' 1"  (1.854 m)  Wt 380 lb (172.367 kg)  BMI 50.13 kg/m2  SpO2 95%  Review of Systems denies fever, galactorrhea, hematuria, memory loss, numbness, abdominal pain, muscle weakness, urinary frequency, hypoglycemia, skin rash, visual loss, sob, diarrhea, depression.  He reports ED sxs.     Objective:   Physical Exam VS: see vs page GEN: no distress HEAD: head: no deformity eyes: no periorbital swelling, no proptosis external nose and ears are normal mouth: no lesion seen NECK: supple, thyroid is not  enlarged CHEST WALL: no deformity LUNGS: clear to auscultation BREASTS:  There is bilat pseudogynecomastia CV: reg rate and rhythm, no murmur ABD: abdomen is soft, nontender.  no hepatosplenomegaly.  not distended.  no hernia.   MUSCULOSKELETAL: muscle bulk and strength are grossly normal.  no obvious joint swelling.  gait is normal and steady EXTEMITIES: no deformity.  no ulcer on the feet.  feet are of normal color and temp.  no edema PULSES: dorsalis pedis intact bilat.  no carotid bruit NEURO:  cn 2-12 grossly intact.   readily moves all 4's.  sensation is intact to touch on the feet SKIN:  Normal texture and temperature.  No rash or suspicious lesion is visible.   NODES:  None palpable at the neck PSYCH: alert, oriented x3.  Does not appear anxious nor depressed.  outside test results are reviewed: PTH=108 Ca++=10.6  Today: Lab Results  Component Value Date   PTH 132.6* 11/29/2011   CALCIUM 10.8* 11/29/2011      Assessment & Plan:  Primary hyperparathyroidism,  mild.  New.  No indication for surgery now.   Knee pain prob not related to elev PTH.   HTN.  The effect of the elev PTH on this is mild

## 2011-11-29 NOTE — Assessment & Plan Note (Signed)
We have discussed various strategies for the weight loss and exercise.

## 2011-11-29 NOTE — Progress Notes (Signed)
  HPI  Caleb Lyons is a 78 y.o. male  seen in followup for complete heart block and has   ischemic heart disease with prior bypass grafting and modest depression of left ventricular systolic function. He presented a year or a half ago with acute failure to pace via his pacemaker and underwent defibrillator generator upgrade to an ICD. It was elected because he had a class II symptoms to implant a dual-chamber device.  Cardiac catheterization January 2011 demonstrated patency of great with intercurrent deterioration of LVEF with an estimation about 30-35%.     Since that time he is a relatively well. His weight is up. He's had some problems with arthritis in his knee for which she recently received an injection. Other than that he is trying to exercise 3 days a week   He denies chest pain peripheral edema nocturnal dyspnea or palpitations. He does have some dyspnea on exertion  Past Medical History  Diagnosis Date  . Complete heart block   . Ischemic heart disease   . PAF (paroxysmal atrial fibrillation)   . Morbid obesity   . HLD (hyperlipidemia)   . CAD (coronary artery disease)     s/p cabg  . Sinus node dysfunction   . HTN (hypertension)   . Type II or unspecified type diabetes mellitus without mention of complication, not stated as uncontrolled   . Impotence of organic origin   . Hyperparathyroidism     Past Surgical History  Procedure Date  . Coronary artery bypass graft     Current Outpatient Prescriptions  Medication Sig Dispense Refill  . aspirin 325 MG tablet Take 325 mg by mouth daily.        Marland Kitchen atorvastatin (LIPITOR) 40 MG tablet Take 40 mg by mouth daily.        . Dutasteride-Tamsulosin HCl (JALYN) 0.5-0.4 MG CAPS Take 1 capsule by mouth daily.        Marland Kitchen levothyroxine (SYNTHROID, LEVOTHROID) 175 MCG tablet Take 175 mcg by mouth daily.        . metoprolol (TOPROL-XL) 50 MG 24 hr tablet Take 50 mg by mouth daily.        . Multiple Vitamin (MULTIVITAMIN) tablet Take  1 tablet by mouth daily.        . valsartan-hydrochlorothiazide (DIOVAN-HCT) 160-25 MG per tablet Take 1 tablet by mouth daily.        . vitamin C (ASCORBIC ACID) 500 MG tablet Take 500 mg by mouth daily.          No Known Allergies  Review of Systems negative except from HPI and PMH  Physical Exam BP 168/96  Pulse 81  Ht 6\' 1"  (1.854 m)  Wt 383 lb (173.728 kg)  BMI 50.53 kg/m2 Well developed and well nourished in no acute distress HENT normal E scleral and icterus clear Neck Supple JVP flat; carotids brisk and full Clear to ausculation regular rate   rate and rhythm, no murmurs gallops or rub Soft with active bowel sounds No clubbing cyanosis 2+ Edema Alert and oriented, grossly normal motor and sensory function Skin Warm and Dry   Assessment and  Plan

## 2011-11-30 LAB — PTH, INTACT AND CALCIUM
Calcium, Total (PTH): 10.8 mg/dL — ABNORMAL HIGH (ref 8.4–10.5)
PTH: 132.6 pg/mL — ABNORMAL HIGH (ref 14.0–72.0)

## 2011-12-04 ENCOUNTER — Encounter: Payer: Self-pay | Admitting: Endocrinology

## 2011-12-04 DIAGNOSIS — E785 Hyperlipidemia, unspecified: Secondary | ICD-10-CM | POA: Insufficient documentation

## 2011-12-04 DIAGNOSIS — E119 Type 2 diabetes mellitus without complications: Secondary | ICD-10-CM | POA: Insufficient documentation

## 2012-01-11 DIAGNOSIS — M25569 Pain in unspecified knee: Secondary | ICD-10-CM | POA: Diagnosis not present

## 2012-02-06 DIAGNOSIS — I10 Essential (primary) hypertension: Secondary | ICD-10-CM | POA: Diagnosis not present

## 2012-02-06 DIAGNOSIS — E7800 Pure hypercholesterolemia, unspecified: Secondary | ICD-10-CM | POA: Diagnosis not present

## 2012-02-06 DIAGNOSIS — E039 Hypothyroidism, unspecified: Secondary | ICD-10-CM | POA: Diagnosis not present

## 2012-02-07 DIAGNOSIS — I10 Essential (primary) hypertension: Secondary | ICD-10-CM | POA: Diagnosis not present

## 2012-02-07 DIAGNOSIS — E039 Hypothyroidism, unspecified: Secondary | ICD-10-CM | POA: Diagnosis not present

## 2012-02-07 DIAGNOSIS — E7800 Pure hypercholesterolemia, unspecified: Secondary | ICD-10-CM | POA: Diagnosis not present

## 2012-03-01 ENCOUNTER — Ambulatory Visit (INDEPENDENT_AMBULATORY_CARE_PROVIDER_SITE_OTHER): Payer: Medicare Other | Admitting: *Deleted

## 2012-03-01 ENCOUNTER — Encounter: Payer: Self-pay | Admitting: Internal Medicine

## 2012-03-01 DIAGNOSIS — I442 Atrioventricular block, complete: Secondary | ICD-10-CM | POA: Diagnosis not present

## 2012-03-02 LAB — REMOTE ICD DEVICE
AL AMPLITUDE: 5 mv
ATRIAL PACING ICD: 15 pct
DEV-0020ICD: NEGATIVE
HV IMPEDENCE: 46 Ohm
RV LEAD AMPLITUDE: 8.3 mv
TZON-0005SLOWVT: 6
VENTRICULAR PACING ICD: 100 pct

## 2012-03-08 NOTE — Progress Notes (Signed)
Remote icd check  

## 2012-03-12 DIAGNOSIS — I259 Chronic ischemic heart disease, unspecified: Secondary | ICD-10-CM | POA: Diagnosis not present

## 2012-03-12 DIAGNOSIS — I10 Essential (primary) hypertension: Secondary | ICD-10-CM | POA: Diagnosis not present

## 2012-03-12 DIAGNOSIS — Z23 Encounter for immunization: Secondary | ICD-10-CM | POA: Diagnosis not present

## 2012-03-12 DIAGNOSIS — E7800 Pure hypercholesterolemia, unspecified: Secondary | ICD-10-CM | POA: Diagnosis not present

## 2012-03-20 ENCOUNTER — Encounter: Payer: Self-pay | Admitting: *Deleted

## 2012-04-02 DIAGNOSIS — D1739 Benign lipomatous neoplasm of skin and subcutaneous tissue of other sites: Secondary | ICD-10-CM | POA: Diagnosis not present

## 2012-04-04 DIAGNOSIS — Z125 Encounter for screening for malignant neoplasm of prostate: Secondary | ICD-10-CM | POA: Diagnosis not present

## 2012-04-04 DIAGNOSIS — R3915 Urgency of urination: Secondary | ICD-10-CM | POA: Diagnosis not present

## 2012-04-04 DIAGNOSIS — N401 Enlarged prostate with lower urinary tract symptoms: Secondary | ICD-10-CM | POA: Diagnosis not present

## 2012-05-01 DIAGNOSIS — J069 Acute upper respiratory infection, unspecified: Secondary | ICD-10-CM | POA: Diagnosis not present

## 2012-05-14 DIAGNOSIS — Z961 Presence of intraocular lens: Secondary | ICD-10-CM | POA: Diagnosis not present

## 2012-06-07 ENCOUNTER — Ambulatory Visit (INDEPENDENT_AMBULATORY_CARE_PROVIDER_SITE_OTHER): Payer: Medicare Other | Admitting: *Deleted

## 2012-06-07 ENCOUNTER — Encounter: Payer: Self-pay | Admitting: Internal Medicine

## 2012-06-07 DIAGNOSIS — I2589 Other forms of chronic ischemic heart disease: Secondary | ICD-10-CM

## 2012-06-07 DIAGNOSIS — I255 Ischemic cardiomyopathy: Secondary | ICD-10-CM

## 2012-06-08 ENCOUNTER — Encounter: Payer: Self-pay | Admitting: *Deleted

## 2012-06-08 DIAGNOSIS — Z4502 Encounter for adjustment and management of automatic implantable cardiac defibrillator: Secondary | ICD-10-CM | POA: Insufficient documentation

## 2012-06-08 LAB — REMOTE ICD DEVICE
AL IMPEDENCE ICD: 410 Ohm
ATRIAL PACING ICD: 6.9 pct
BAMS-0003: 70 {beats}/min
DEVICE MODEL ICD: 600199
RV LEAD IMPEDENCE ICD: 390 Ohm
RV LEAD THRESHOLD: 0.625 V
TZON-0003SLOWVT: 350 ms
TZON-0004SLOWVT: 24
TZON-0010SLOWVT: 80 ms
VENTRICULAR PACING ICD: 100 pct

## 2012-07-05 ENCOUNTER — Encounter: Payer: Self-pay | Admitting: *Deleted

## 2012-07-10 DIAGNOSIS — D2330 Other benign neoplasm of skin of unspecified part of face: Secondary | ICD-10-CM | POA: Diagnosis not present

## 2012-07-10 DIAGNOSIS — L820 Inflamed seborrheic keratosis: Secondary | ICD-10-CM | POA: Diagnosis not present

## 2012-07-10 DIAGNOSIS — D1739 Benign lipomatous neoplasm of skin and subcutaneous tissue of other sites: Secondary | ICD-10-CM | POA: Diagnosis not present

## 2012-08-10 DIAGNOSIS — Z23 Encounter for immunization: Secondary | ICD-10-CM | POA: Diagnosis not present

## 2012-09-05 DIAGNOSIS — Z79899 Other long term (current) drug therapy: Secondary | ICD-10-CM | POA: Diagnosis not present

## 2012-09-10 ENCOUNTER — Encounter: Payer: Self-pay | Admitting: Internal Medicine

## 2012-09-10 ENCOUNTER — Ambulatory Visit (INDEPENDENT_AMBULATORY_CARE_PROVIDER_SITE_OTHER): Payer: Medicare Other | Admitting: *Deleted

## 2012-09-10 DIAGNOSIS — Z95810 Presence of automatic (implantable) cardiac defibrillator: Secondary | ICD-10-CM | POA: Diagnosis not present

## 2012-09-10 DIAGNOSIS — I2589 Other forms of chronic ischemic heart disease: Secondary | ICD-10-CM

## 2012-09-10 DIAGNOSIS — I255 Ischemic cardiomyopathy: Secondary | ICD-10-CM

## 2012-09-10 DIAGNOSIS — I10 Essential (primary) hypertension: Secondary | ICD-10-CM | POA: Diagnosis not present

## 2012-09-10 DIAGNOSIS — E210 Primary hyperparathyroidism: Secondary | ICD-10-CM | POA: Diagnosis not present

## 2012-09-10 LAB — REMOTE ICD DEVICE
AL THRESHOLD: 0.75 V
BAMS-0001: 150 {beats}/min
BAMS-0003: 70 {beats}/min
DEV-0020ICD: NEGATIVE
HV IMPEDENCE: 47 Ohm
RV LEAD AMPLITUDE: 7.9 mv
RV LEAD THRESHOLD: 0.625 V
TZON-0005SLOWVT: 6

## 2012-09-25 ENCOUNTER — Ambulatory Visit: Payer: Medicare Other | Admitting: Endocrinology

## 2012-10-04 DIAGNOSIS — R3915 Urgency of urination: Secondary | ICD-10-CM | POA: Diagnosis not present

## 2012-10-04 DIAGNOSIS — Z79899 Other long term (current) drug therapy: Secondary | ICD-10-CM | POA: Diagnosis not present

## 2012-10-04 DIAGNOSIS — N401 Enlarged prostate with lower urinary tract symptoms: Secondary | ICD-10-CM | POA: Diagnosis not present

## 2012-10-04 DIAGNOSIS — R609 Edema, unspecified: Secondary | ICD-10-CM | POA: Diagnosis not present

## 2012-10-04 DIAGNOSIS — I10 Essential (primary) hypertension: Secondary | ICD-10-CM | POA: Diagnosis not present

## 2012-10-04 DIAGNOSIS — R0609 Other forms of dyspnea: Secondary | ICD-10-CM | POA: Diagnosis not present

## 2012-10-05 DIAGNOSIS — R609 Edema, unspecified: Secondary | ICD-10-CM | POA: Diagnosis not present

## 2012-10-15 DIAGNOSIS — R609 Edema, unspecified: Secondary | ICD-10-CM | POA: Diagnosis not present

## 2012-10-15 DIAGNOSIS — I10 Essential (primary) hypertension: Secondary | ICD-10-CM | POA: Diagnosis not present

## 2012-10-16 ENCOUNTER — Encounter: Payer: Self-pay | Admitting: *Deleted

## 2012-11-20 DIAGNOSIS — Z79899 Other long term (current) drug therapy: Secondary | ICD-10-CM | POA: Diagnosis not present

## 2012-12-18 ENCOUNTER — Encounter: Payer: Self-pay | Admitting: Internal Medicine

## 2012-12-18 ENCOUNTER — Ambulatory Visit (INDEPENDENT_AMBULATORY_CARE_PROVIDER_SITE_OTHER): Payer: Medicare Other | Admitting: Internal Medicine

## 2012-12-18 VITALS — BP 177/90 | HR 76 | Ht 73.0 in | Wt >= 6400 oz

## 2012-12-18 DIAGNOSIS — Z95810 Presence of automatic (implantable) cardiac defibrillator: Secondary | ICD-10-CM | POA: Diagnosis not present

## 2012-12-18 DIAGNOSIS — I10 Essential (primary) hypertension: Secondary | ICD-10-CM

## 2012-12-18 DIAGNOSIS — I442 Atrioventricular block, complete: Secondary | ICD-10-CM | POA: Diagnosis not present

## 2012-12-18 LAB — ICD DEVICE OBSERVATION
AL AMPLITUDE: 5 mv
AL IMPEDENCE ICD: 425 Ohm
AL THRESHOLD: 0.875 v
ATRIAL PACING ICD: 8.5 pct
BAMS-0001: 150 {beats}/min
BAMS-0003: 70 {beats}/min
CHARGE TIME: 9.5 s
DEV-0020ICD: NEGATIVE
DEVICE MODEL ICD: 600199
FVT: 0
HV IMPEDENCE: 47 Ohm
MODE SWITCH EPISODES: 0
PACEART VT: 0
RV LEAD AMPLITUDE: 7.9 mv
RV LEAD IMPEDENCE ICD: 362.5 Ohm
RV LEAD THRESHOLD: 0.625 v
TOT-0006: 20110303000000
TOT-0007: 4
TOT-0008: 0
TOT-0009: 1
TOT-0010: 10
TZON-0003SLOWVT: 350 ms
TZON-0004SLOWVT: 24
TZON-0005SLOWVT: 6
TZON-0010SLOWVT: 80 ms
VENTRICULAR PACING ICD: 99 pct
VF: 0

## 2012-12-18 NOTE — Assessment & Plan Note (Signed)
I spoke with his PCP, Dr. Lin Landsman, he will resume his Diovan HCT and Dr. Reddy's office for followup with him about this

## 2012-12-18 NOTE — Assessment & Plan Note (Signed)
Stable post pacing 

## 2012-12-18 NOTE — Assessment & Plan Note (Signed)
The patient's device was interrogated.  The information was reviewed. No changes were made in the programming.    

## 2012-12-18 NOTE — Progress Notes (Signed)
Patient Care Team: Jacqlyn Larsen II as PCP - General (Unknown Physician Specialty) Mauri Pole, MD (Orthopedic Surgery)   HPI  Caleb Lyons is a 79 y.o. male seen in followup for complete heart block and has  ischemic heart disease with prior bypass grafting and modest depression of left ventricular systolic function. He presented 2011 with acute failure to pace via his pacemaker and underwent defibrillator generator upgrade to an ICD. It was elected because he had a class II symptoms to implant a dual-chamber device.  Cardiac catheterization January 2011 demonstrated patency of great with intercurrent deterioration of LVEF with an estimation about 30-35%.   The patient denies chest pain, shortness of breath, nocturnal dyspnea, orthopnea or peripheral edema.  There have been no palpitations, lightheadedness or syncope.   His PCP adjusted his antihypertensives and removed his diuretics. This is associated with some edema and increasing blood pressure.     Past Medical History  Diagnosis Date  . Complete heart block   . Ischemic cardiomyopathy     s/pCABG, Graft patent 3/11; EF 30-35%  . PAF (paroxysmal atrial fibrillation)   . Obesity        . Sinus node dysfunction   . HTN (hypertension)   . Impotence of organic origin   . Hyperparathyroidism   . Dual implantable cardiac defibrillator in situ     [facility]  . OSA (obstructive sleep apnea)   . History of chicken pox   . Type II or unspecified type diabetes mellitus without mention of complication, not stated as uncontrolled   . HLD (hyperlipidemia)     Past Surgical History  Procedure Date  . Coronary artery bypass graft   . Tonsillectomy     Current Outpatient Prescriptions  Medication Sig Dispense Refill  . aspirin EC 81 MG tablet Take 1 tablet (81 mg total) by mouth daily.  150 tablet  2  . atorvastatin (LIPITOR) 40 MG tablet Take 40 mg by mouth daily.        . Dutasteride-Tamsulosin HCl (JALYN) 0.5-0.4 MG CAPS  Take 1 capsule by mouth daily.        . Glucos-Chond-Sterol-Fish Oil (GLUCOSAMINE CHONDROITIN PLUS PO) Take 1 tablet by mouth 2 (two) times daily.      Marland Kitchen levothyroxine (SYNTHROID, LEVOTHROID) 175 MCG tablet Take 175 mcg by mouth daily.        . metoprolol (TOPROL-XL) 50 MG 24 hr tablet Take 50 mg by mouth daily.        . Multiple Vitamin (MULTIVITAMIN) tablet Take 1 tablet by mouth daily.        . NON FORMULARY Super Beta Prostate 600mg   2 by mouth once daily       . Tamsulosin HCl (FLOMAX) 0.4 MG CAPS Take 0.4 mg by mouth daily.      . valsartan (DIOVAN) 160 MG tablet Take 160 mg by mouth daily.      . vitamin C (ASCORBIC ACID) 500 MG tablet Take 500 mg by mouth daily.          No Known Allergies  Review of Systems negative except from HPI and PMH  Physical Exam BP 177/90  Pulse 76  Ht 6\' 1"  (1.854 m)  Wt 404 lb 1.9 oz (183.308 kg)  BMI 53.32 kg/m2 Well developed and morbidly obese  in no acute distress HENT normal E scleral and icterus clear Neck Supple JVP flat; carotids brisk and full Clear to ausculation Well`Regular rate and rhythm, no murmurs gallops or  rub Soft with active bowel sounds No clubbing cyanosis Trace Edema Alert and oriented, grossly normal motor and sensory function Skin Warm and Dry  ECG demonstrates P-synchronous/ AV  pacing  Assessment and  Plan

## 2012-12-18 NOTE — Patient Instructions (Signed)
Your physician wants you to follow-up in: 6 months with Dr. Klein. You will receive a reminder letter in the mail two months in advance. If you don't receive a letter, please call our office to schedule the follow-up appointment.  Your physician recommends that you continue on your current medications as directed. Please refer to the Current Medication list given to you today.  

## 2013-01-01 ENCOUNTER — Ambulatory Visit (INDEPENDENT_AMBULATORY_CARE_PROVIDER_SITE_OTHER): Payer: Medicare Other | Admitting: Endocrinology

## 2013-01-01 VITALS — BP 128/80 | HR 80 | Wt 399.0 lb

## 2013-01-01 DIAGNOSIS — E210 Primary hyperparathyroidism: Secondary | ICD-10-CM | POA: Diagnosis not present

## 2013-01-01 NOTE — Patient Instructions (Addendum)
blood tests are being requested for you today.  We'll contact you with results.   Please return in 1 year.   

## 2013-01-01 NOTE — Progress Notes (Signed)
Subjective:    Patient ID: Caleb Lyons, male    DOB: [birth year], 79 y.o.   MRN: [id number]  HPI Pt returns for f/u of primary hyperparathyroidism.  He has no indications for for surgery.  No recent bony fracture.  No h/o urolithiasis.  pt states he feels well in general, except for arthralgias.   Past Medical History  Diagnosis Date  . Complete heart block   . Ischemic cardiomyopathy     s/pCABG, Graft patent 3/11; EF 30-35%  . PAF (paroxysmal atrial fibrillation)   . Obesity        . Sinus node dysfunction   . HTN (hypertension)   . Impotence of organic origin   . Hyperparathyroidism   . Dual implantable cardiac defibrillator in situ     [facility]  . OSA (obstructive sleep apnea)   . History of chicken pox   . Type II or unspecified type diabetes mellitus without mention of complication, not stated as uncontrolled   . HLD (hyperlipidemia)     Past Surgical History  Procedure Laterality Date  . Coronary artery bypass graft    . Tonsillectomy      History   Social History  . Marital Status: Married    Spouse Name: N/A    Number of Children: N/A  . Years of Education: 16   Occupational History  . Retired    Social History Main Topics  . Smoking status: Never Smoker   . Smokeless tobacco: Never Used  . Alcohol Use: Yes  . Drug Use: No  . Sexually Active: Not on file   Other Topics Concern  . Not on file   Social History Narrative   Regular exercise-yes    Current Outpatient Prescriptions on File Prior to Visit  Medication Sig Dispense Refill  . aspirin EC 81 MG tablet Take 1 tablet (81 mg total) by mouth daily.  150 tablet  2  . atorvastatin (LIPITOR) 40 MG tablet Take 40 mg by mouth daily.        . Dutasteride-Tamsulosin HCl (JALYN) 0.5-0.4 MG CAPS Take 1 capsule by mouth daily.        . Glucos-Chond-Sterol-Fish Oil (GLUCOSAMINE CHONDROITIN PLUS PO) Take 1 tablet by mouth 2 (two) times daily.      Marland Kitchen levothyroxine (SYNTHROID, LEVOTHROID) 175 MCG tablet  Take 175 mcg by mouth daily.        . metoprolol (TOPROL-XL) 50 MG 24 hr tablet Take 50 mg by mouth daily.        . Multiple Vitamin (MULTIVITAMIN) tablet Take 1 tablet by mouth daily.        . NON FORMULARY Super Beta Prostate 600mg   2 by mouth once daily       . Tamsulosin HCl (FLOMAX) 0.4 MG CAPS Take 0.4 mg by mouth daily.      . valsartan (DIOVAN) 160 MG tablet Take 160 mg by mouth daily.      . vitamin C (ASCORBIC ACID) 500 MG tablet Take 500 mg by mouth daily.         No current facility-administered medications on file prior to visit.    No Known Allergies  Family History  Problem Relation Age of Onset  . Early death Father   . Stroke Father     BP 128/80  Pulse 80  Wt 399 lb (180.985 kg)  BMI 52.65 kg/m2  SpO2 95%  Review of Systems He reports urinary frequency.      Objective:   Physical  Exam VITAL SIGNS:  See vs page GENERAL: no distress.  Obese NECK: There is no palpable thyroid enlargement.  No thyroid nodule is palpable.  No palpable lymphadenopathy at the anterior neck.   MSK: no deformity.   Gait: normal except he favors RLE.      Assessment & Plan:  Primary hyperparathyroidism: no surgical indication now.  No medical rx is effective

## 2013-01-02 LAB — PTH, INTACT AND CALCIUM
Calcium, Total (PTH): 11 mg/dL — ABNORMAL HIGH (ref 8.4–10.5)
PTH: 112.3 pg/mL — ABNORMAL HIGH (ref 14.0–72.0)

## 2013-02-11 DIAGNOSIS — H26499 Other secondary cataract, unspecified eye: Secondary | ICD-10-CM | POA: Diagnosis not present

## 2013-03-07 DIAGNOSIS — Z79899 Other long term (current) drug therapy: Secondary | ICD-10-CM | POA: Diagnosis not present

## 2013-03-12 DIAGNOSIS — E039 Hypothyroidism, unspecified: Secondary | ICD-10-CM | POA: Diagnosis not present

## 2013-03-18 ENCOUNTER — Ambulatory Visit (INDEPENDENT_AMBULATORY_CARE_PROVIDER_SITE_OTHER): Payer: Medicare Other | Admitting: *Deleted

## 2013-03-18 ENCOUNTER — Encounter: Payer: Self-pay | Admitting: Internal Medicine

## 2013-03-18 ENCOUNTER — Other Ambulatory Visit: Payer: Self-pay

## 2013-03-18 DIAGNOSIS — I2589 Other forms of chronic ischemic heart disease: Secondary | ICD-10-CM | POA: Diagnosis not present

## 2013-03-18 DIAGNOSIS — Z95810 Presence of automatic (implantable) cardiac defibrillator: Secondary | ICD-10-CM

## 2013-03-18 DIAGNOSIS — I255 Ischemic cardiomyopathy: Secondary | ICD-10-CM

## 2013-03-19 LAB — REMOTE ICD DEVICE
AL AMPLITUDE: 4.7 mv
ATRIAL PACING ICD: 6.8 pct
DEVICE MODEL ICD: 600199
HV IMPEDENCE: 48 Ohm
RV LEAD IMPEDENCE ICD: 390 Ohm
TZON-0004SLOWVT: 24
TZON-0005SLOWVT: 6
TZON-0010SLOWVT: 80 ms

## 2013-04-03 DIAGNOSIS — N401 Enlarged prostate with lower urinary tract symptoms: Secondary | ICD-10-CM | POA: Diagnosis not present

## 2013-04-03 DIAGNOSIS — R351 Nocturia: Secondary | ICD-10-CM | POA: Diagnosis not present

## 2013-04-04 DIAGNOSIS — Z125 Encounter for screening for malignant neoplasm of prostate: Secondary | ICD-10-CM | POA: Diagnosis not present

## 2013-04-05 ENCOUNTER — Encounter: Payer: Self-pay | Admitting: *Deleted

## 2013-05-01 DIAGNOSIS — L578 Other skin changes due to chronic exposure to nonionizing radiation: Secondary | ICD-10-CM | POA: Diagnosis not present

## 2013-05-01 DIAGNOSIS — L570 Actinic keratosis: Secondary | ICD-10-CM | POA: Diagnosis not present

## 2013-05-01 DIAGNOSIS — L820 Inflamed seborrheic keratosis: Secondary | ICD-10-CM | POA: Diagnosis not present

## 2013-05-01 DIAGNOSIS — L821 Other seborrheic keratosis: Secondary | ICD-10-CM | POA: Diagnosis not present

## 2013-05-01 DIAGNOSIS — D1739 Benign lipomatous neoplasm of skin and subcutaneous tissue of other sites: Secondary | ICD-10-CM | POA: Diagnosis not present

## 2013-06-03 DIAGNOSIS — Z0000 Encounter for general adult medical examination without abnormal findings: Secondary | ICD-10-CM | POA: Diagnosis not present

## 2013-06-03 DIAGNOSIS — E7800 Pure hypercholesterolemia, unspecified: Secondary | ICD-10-CM | POA: Diagnosis not present

## 2013-06-03 DIAGNOSIS — I10 Essential (primary) hypertension: Secondary | ICD-10-CM | POA: Diagnosis not present

## 2013-06-03 DIAGNOSIS — I259 Chronic ischemic heart disease, unspecified: Secondary | ICD-10-CM | POA: Diagnosis not present

## 2013-06-05 ENCOUNTER — Encounter: Payer: Self-pay | Admitting: Internal Medicine

## 2013-06-05 DIAGNOSIS — E7800 Pure hypercholesterolemia, unspecified: Secondary | ICD-10-CM | POA: Diagnosis not present

## 2013-06-17 ENCOUNTER — Ambulatory Visit (INDEPENDENT_AMBULATORY_CARE_PROVIDER_SITE_OTHER): Payer: Medicare Other | Admitting: *Deleted

## 2013-06-17 DIAGNOSIS — Z95810 Presence of automatic (implantable) cardiac defibrillator: Secondary | ICD-10-CM | POA: Diagnosis not present

## 2013-06-17 DIAGNOSIS — I2589 Other forms of chronic ischemic heart disease: Secondary | ICD-10-CM

## 2013-06-17 DIAGNOSIS — I255 Ischemic cardiomyopathy: Secondary | ICD-10-CM

## 2013-06-18 LAB — REMOTE ICD DEVICE
AL IMPEDENCE ICD: 410 Ohm
AL THRESHOLD: 0.625 V
ATRIAL PACING ICD: 5.5 pct
BAMS-0003: 70 {beats}/min
RV LEAD THRESHOLD: 0.75 V
TZON-0003SLOWVT: 350 ms
TZON-0010SLOWVT: 80 ms

## 2013-07-23 ENCOUNTER — Encounter: Payer: Self-pay | Admitting: *Deleted

## 2013-07-29 DIAGNOSIS — E210 Primary hyperparathyroidism: Secondary | ICD-10-CM | POA: Diagnosis not present

## 2013-07-29 DIAGNOSIS — I10 Essential (primary) hypertension: Secondary | ICD-10-CM | POA: Diagnosis not present

## 2013-07-29 DIAGNOSIS — I259 Chronic ischemic heart disease, unspecified: Secondary | ICD-10-CM | POA: Diagnosis not present

## 2013-07-29 DIAGNOSIS — E7800 Pure hypercholesterolemia, unspecified: Secondary | ICD-10-CM | POA: Diagnosis not present

## 2013-08-20 DIAGNOSIS — Z23 Encounter for immunization: Secondary | ICD-10-CM | POA: Diagnosis not present

## 2013-08-27 ENCOUNTER — Encounter: Payer: Self-pay | Admitting: Internal Medicine

## 2013-09-16 ENCOUNTER — Encounter: Payer: Self-pay | Admitting: Internal Medicine

## 2013-09-16 ENCOUNTER — Ambulatory Visit (INDEPENDENT_AMBULATORY_CARE_PROVIDER_SITE_OTHER): Payer: Medicare Other | Admitting: *Deleted

## 2013-09-16 DIAGNOSIS — I442 Atrioventricular block, complete: Secondary | ICD-10-CM

## 2013-09-16 LAB — REMOTE ICD DEVICE
AL AMPLITUDE: 5 mv
ATRIAL PACING ICD: 6.4 pct
BAMS-0001: 150 {beats}/min
DEV-0020ICD: NEGATIVE
HV IMPEDENCE: 53 Ohm
RV LEAD AMPLITUDE: 6.7 mv
TZON-0005SLOWVT: 6
TZON-0010SLOWVT: 80 ms

## 2013-09-26 NOTE — Progress Notes (Signed)
Remote defib received

## 2013-09-27 ENCOUNTER — Encounter: Payer: Self-pay | Admitting: *Deleted

## 2013-10-07 DIAGNOSIS — N401 Enlarged prostate with lower urinary tract symptoms: Secondary | ICD-10-CM | POA: Diagnosis not present

## 2013-10-07 DIAGNOSIS — R351 Nocturia: Secondary | ICD-10-CM | POA: Diagnosis not present

## 2013-10-08 DIAGNOSIS — E039 Hypothyroidism, unspecified: Secondary | ICD-10-CM | POA: Diagnosis not present

## 2013-11-16 DIAGNOSIS — L255 Unspecified contact dermatitis due to plants, except food: Secondary | ICD-10-CM | POA: Diagnosis not present

## 2013-11-16 DIAGNOSIS — L401 Generalized pustular psoriasis: Secondary | ICD-10-CM | POA: Diagnosis not present

## 2013-12-18 ENCOUNTER — Encounter (INDEPENDENT_AMBULATORY_CARE_PROVIDER_SITE_OTHER): Payer: Self-pay

## 2013-12-18 ENCOUNTER — Encounter: Payer: Self-pay | Admitting: Internal Medicine

## 2013-12-18 ENCOUNTER — Ambulatory Visit (INDEPENDENT_AMBULATORY_CARE_PROVIDER_SITE_OTHER): Payer: Medicare Other | Admitting: Internal Medicine

## 2013-12-18 VITALS — BP 112/62 | HR 78 | Ht 73.0 in | Wt 370.0 lb

## 2013-12-18 DIAGNOSIS — I442 Atrioventricular block, complete: Secondary | ICD-10-CM

## 2013-12-18 DIAGNOSIS — Z95810 Presence of automatic (implantable) cardiac defibrillator: Secondary | ICD-10-CM

## 2013-12-18 DIAGNOSIS — I2589 Other forms of chronic ischemic heart disease: Secondary | ICD-10-CM

## 2013-12-18 DIAGNOSIS — I255 Ischemic cardiomyopathy: Secondary | ICD-10-CM

## 2013-12-18 DIAGNOSIS — I5032 Chronic diastolic (congestive) heart failure: Secondary | ICD-10-CM | POA: Diagnosis not present

## 2013-12-18 LAB — MDC_IDC_ENUM_SESS_TYPE_INCLINIC
Battery Remaining Longevity: 58.8 mo
Date Time Interrogation Session: 20150128112027
HighPow Impedance: 47.5554
Implantable Pulse Generator Serial Number: 600199
Lead Channel Impedance Value: 375 Ohm
Lead Channel Pacing Threshold Amplitude: 0.75 V
Lead Channel Sensing Intrinsic Amplitude: 6.5 mV
Lead Channel Setting Pacing Amplitude: 1 V
MDC IDC MSMT LEADCHNL RA IMPEDANCE VALUE: 437.5 Ohm
MDC IDC MSMT LEADCHNL RA PACING THRESHOLD AMPLITUDE: 0.75 V
MDC IDC MSMT LEADCHNL RA PACING THRESHOLD PULSEWIDTH: 0.5 ms
MDC IDC MSMT LEADCHNL RA SENSING INTR AMPL: 4 mV
MDC IDC MSMT LEADCHNL RV PACING THRESHOLD PULSEWIDTH: 0.5 ms
MDC IDC SET LEADCHNL RA PACING AMPLITUDE: 1.75 V
MDC IDC SET LEADCHNL RV PACING PULSEWIDTH: 0.5 ms
MDC IDC SET LEADCHNL RV SENSING SENSITIVITY: 2 mV
MDC IDC SET ZONE DETECTION INTERVAL: 350 ms
MDC IDC STAT BRADY RA PERCENT PACED: 12 %
MDC IDC STAT BRADY RV PERCENT PACED: 96 %
Zone Setting Detection Interval: 300 ms

## 2013-12-18 MED ORDER — FUROSEMIDE 20 MG PO TABS: ORAL_TABLET | ORAL | Status: DC

## 2013-12-18 NOTE — Addendum Note (Signed)
Addended by: Stanton Kidney on: 12/18/2013 11:55 AM   Modules accepted: Orders

## 2013-12-18 NOTE — Assessment & Plan Note (Signed)
The patient's device was interrogated.  The information was reviewed. No changes were made in the programming.    

## 2013-12-18 NOTE — Assessment & Plan Note (Signed)
Stable post pacing 

## 2013-12-18 NOTE — Patient Instructions (Addendum)
Your physician has recommended you make the following change in your medication:  1) Start Lasix 20 mg daily for 3 days,  then take 20 mg every other day for 10 days, then stop  Please call us in 2 weeks to update [place]. Khiara Shuping, RN @ [phone]  Your physician has requested that you have a lexiscan myoview. For further information please visit [email or web address]. Please follow instruction sheet, as given.  Your physician recommends that you return for lab work when you come for your lexi scan: BMET/CBC  Remote monitoring is used to monitor your Pacemaker of ICD from home. This monitoring reduces the number of office visits required to check your device to one time per year. It allows [place] to keep an eye on the functioning of your device to ensure it is working properly. You are scheduled for a device check from home on 03/24/14. You may send your transmission at any time that day. If you have a wireless device, the transmission will be sent automatically. After your physician reviews your transmission, you will receive a postcard with your next transmission date.  Your physician wants you to follow-up in: 1 year with Dr. Caryl Comes.  You will receive a reminder letter in the mail two months in advance. If you don't receive a letter, please call our office to schedule the follow-up appointment.

## 2013-12-18 NOTE — Assessment & Plan Note (Signed)
Will add a low dose of a diuretic for a 2 week trial to see if we can't improve dyspnea.   Renal function at that time was normal  hemoglobin was also normal

## 2013-12-18 NOTE — Progress Notes (Signed)
Patient Care Team: Ernestene Kiel as PCP - General (Internal Medicine) Mauri Pole, MD (Orthopedic Surgery)   HPI  Caleb Lyons is a 80 y.o. male seen in followup for complete heart block and has  ischemic heart disease with prior bypass grafting and modest depression of left ventricular systolic function. He presented 2011 with acute failure to pace via his pacemaker and underwent defibrillator generator upgrade to an ICD. It was elected because he had a class II symptoms to implant a dual-chamber device.   His major complaint is dyspnea with exertion. He denies chest pain. He's had some peripheral edema. He sleeps with CPAP  Cardiac catheterization January 2011 demonstrated patency of great with intercurrent deterioration of LVEF with an estimation about 30-35%.  Echocardiogram however 2011 demonstrated ejection fraction was normal      Past Medical History  Diagnosis Date  . Complete heart block   . Ischemic cardiomyopathy     s/pCABG, Graft patent 3/11; EF 30-35%  . PAF (paroxysmal atrial fibrillation)   . Obesity        . Sinus node dysfunction   . HTN (hypertension)   . Impotence of organic origin   . Hyperparathyroidism   . Dual implantable cardiac defibrillator in situ     [facility]  . OSA (obstructive sleep apnea)   . History of chicken pox   . Type II or unspecified type diabetes mellitus without mention of complication, not stated as uncontrolled   . HLD (hyperlipidemia)     Past Surgical History  Procedure Laterality Date  . Coronary artery bypass graft    . Tonsillectomy      Current Outpatient Prescriptions  Medication Sig Dispense Refill  . aspirin EC 81 MG tablet Take 1 tablet (81 mg total) by mouth daily.  150 tablet  2  . atorvastatin (LIPITOR) 40 MG tablet Take 40 mg by mouth daily.        . Dutasteride-Tamsulosin HCl (JALYN) 0.5-0.4 MG CAPS Take 1 capsule by mouth daily.        . Glucos-Chond-Sterol-Fish Oil (GLUCOSAMINE CHONDROITIN  PLUS PO) Take 1 tablet by mouth 2 (two) times daily.      Marland Kitchen levothyroxine (SYNTHROID, LEVOTHROID) 175 MCG tablet Take 175 mcg by mouth daily.        . metoprolol (TOPROL-XL) 50 MG 24 hr tablet Take 50 mg by mouth daily.        . Multiple Vitamin (MULTIVITAMIN) tablet Take 1 tablet by mouth daily.        . NON FORMULARY Super Beta Prostate 600mg   2 by mouth once daily       . Tamsulosin HCl (FLOMAX) 0.4 MG CAPS Take 0.4 mg by mouth daily.      . valsartan-hydrochlorothiazide (DIOVAN-HCT) 160-25 MG per tablet Take 1 tablet by mouth daily.      . vitamin C (ASCORBIC ACID) 500 MG tablet Take 500 mg by mouth daily.         No current facility-administered medications for this visit.    No Known Allergies  Review of Systems negative except from HPI and PMH  Physical Exam BP 112/62  Pulse 78  Ht 6\' 1"  (1.854 m)  Wt 370 lb (167.831 kg)  BMI 48.83 kg/m2 Well developed and morbidly obese in no acute distress HENT normal E scleral and icterus clear Neck Supple JVP flat; carotids brisk and full Clear to ausculation Device pocket well healed; without hematoma or erythema.  There is  no tethering regular rate and rhythm, no murmurs gallops or rub Soft with active bowel sounds No clubbing cyanosis Trace Edema Alert and oriented, grossly normal motor and sensory function Skin Warm and Dry  ECG demonstrates sinus rhythm with P-synchronous/ AV  pacing   Assessment and  Plan

## 2013-12-18 NOTE — Assessment & Plan Note (Addendum)
Left ventricular function has been variably identify 35--normal. We'll undertake a Lexi-Scan Myoview to assess both perfusion as well as left ventricular function

## 2013-12-20 DIAGNOSIS — I2510 Atherosclerotic heart disease of native coronary artery without angina pectoris: Secondary | ICD-10-CM | POA: Diagnosis not present

## 2013-12-20 DIAGNOSIS — E039 Hypothyroidism, unspecified: Secondary | ICD-10-CM | POA: Diagnosis not present

## 2013-12-20 DIAGNOSIS — M1710 Unilateral primary osteoarthritis, unspecified knee: Secondary | ICD-10-CM | POA: Diagnosis not present

## 2013-12-20 DIAGNOSIS — E785 Hyperlipidemia, unspecified: Secondary | ICD-10-CM | POA: Diagnosis not present

## 2013-12-20 DIAGNOSIS — I10 Essential (primary) hypertension: Secondary | ICD-10-CM | POA: Diagnosis not present

## 2014-01-01 ENCOUNTER — Encounter: Payer: Self-pay | Admitting: Endocrinology

## 2014-01-01 ENCOUNTER — Ambulatory Visit (INDEPENDENT_AMBULATORY_CARE_PROVIDER_SITE_OTHER): Payer: Medicare Other | Admitting: Endocrinology

## 2014-01-01 VITALS — BP 118/70 | HR 83 | Temp 98.1°F | Ht 73.0 in | Wt 370.0 lb

## 2014-01-01 DIAGNOSIS — E210 Primary hyperparathyroidism: Secondary | ICD-10-CM | POA: Diagnosis not present

## 2014-01-01 DIAGNOSIS — I2589 Other forms of chronic ischemic heart disease: Secondary | ICD-10-CM

## 2014-01-01 NOTE — Patient Instructions (Addendum)
blood tests are being requested for you today.  We'll contact you with results.   Based on the results, i may prescribe for you a ill called "sensipar."  Please return in 3 months.

## 2014-01-01 NOTE — Progress Notes (Signed)
Subjective:    Patient ID: Caleb Lyons, male    DOB: 1930-07-31, 80 y.o.   MRN: [id number]  HPI Pt returns for f/u of primary hyperparathyroidism (dx'ed 2012, on a routine blood test).  He has no indications for for surgery.  No recent bony fracture.  No h/o urolithiasis.  pt states he feels well in general, except for arthralgias of the knees.   Past Medical History  Diagnosis Date  . Complete heart block   . Ischemic cardiomyopathy     s/pCABG, Graft patent 3/11; EF 30-35%  . PAF (paroxysmal atrial fibrillation)   . Obesity        . Sinus node dysfunction   . HTN (hypertension)   . Impotence of organic origin   . Hyperparathyroidism   . Dual implantable cardiac defibrillator in situ     [employer] Judes  . OSA (obstructive sleep apnea)   . History of chicken pox   . Type II or unspecified type diabetes mellitus without mention of complication, not stated as uncontrolled   . HLD (hyperlipidemia)     Past Surgical History  Procedure Laterality Date  . Coronary artery bypass graft    . Tonsillectomy      History   Social History  . Marital Status: Married    Spouse Name: N/A    Number of Children: N/A  . Years of Education: 16   Occupational History  . Retired    Social History Main Topics  . Smoking status: Never Smoker   . Smokeless tobacco: Never Used  . Alcohol Use: Yes  . Drug Use: No  . Sexual Activity: Not on file   Other Topics Concern  . Not on file   Social History Narrative   Regular exercise-yes    Current Outpatient Prescriptions on File Prior to Visit  Medication Sig Dispense Refill  . aspirin EC 81 MG tablet Take 1 tablet (81 mg total) by mouth daily.  150 tablet  2  . atorvastatin (LIPITOR) 40 MG tablet Take 40 mg by mouth daily.        . Dutasteride-Tamsulosin HCl (JALYN) 0.5-0.4 MG CAPS Take 1 capsule by mouth daily.        . furosemide (LASIX) 20 MG tablet Take 1 tablet (20 mg total) by mouth daily for 3 days. Then take 1 tablet (20 my  total) by mouth every other day for 10 days.  8 tablet  0  . Glucos-Chond-Sterol-Fish Oil (GLUCOSAMINE CHONDROITIN PLUS PO) Take 1 tablet by mouth 2 (two) times daily.      Marland Kitchen levothyroxine (SYNTHROID, LEVOTHROID) 175 MCG tablet Take 175 mcg by mouth daily.        . metoprolol (TOPROL-XL) 50 MG 24 hr tablet Take 50 mg by mouth daily.        . Multiple Vitamin (MULTIVITAMIN) tablet Take 1 tablet by mouth daily.        . NON FORMULARY Super Beta Prostate 600mg   2 by mouth once daily       . Tamsulosin HCl (FLOMAX) 0.4 MG CAPS Take 0.4 mg by mouth daily.      . valsartan-hydrochlorothiazide (DIOVAN-HCT) 160-25 MG per tablet Take 1 tablet by mouth daily.      . vitamin C (ASCORBIC ACID) 500 MG tablet Take 500 mg by mouth daily.         No current facility-administered medications on file prior to visit.    No Known Allergies  Family History  Problem Relation Age  of Onset  . Early death Father   . Stroke Father     BP 118/70  Pulse 83  Temp(Src) 98.1 F (36.7 C) (Oral)  Ht 6\' 1"  (1.854 m)  Wt 370 lb (167.831 kg)  BMI 48.83 kg/m2  SpO2 94%   Review of Systems Denies muscle weakness.     Objective:   Physical Exam VITAL SIGNS:  See vs page GENERAL: no distress Hands: no deformity Gait: steady with a cane.   Lab Results  Component Value Date   PTH 128.3* 01/01/2014   CALCIUM 11.2* 01/01/2014      Assessment & Plan:  Primary hyperparathyroidism: no surgical indication now.  We'll try sensipar.

## 2014-01-02 LAB — PTH, INTACT AND CALCIUM
Calcium: 11.2 mg/dL — ABNORMAL HIGH (ref 8.4–10.5)
PTH: 128.3 pg/mL — ABNORMAL HIGH (ref 14.0–72.0)

## 2014-01-02 MED ORDER — CINACALCET HCL 30 MG PO TABS: 30.0000 mg | ORAL_TABLET | Freq: Every day | ORAL | Status: DC

## 2014-01-03 ENCOUNTER — Encounter: Payer: Self-pay | Admitting: Internal Medicine

## 2014-01-06 ENCOUNTER — Telehealth: Payer: Self-pay | Admitting: Internal Medicine

## 2014-01-06 NOTE — Telephone Encounter (Signed)
New message    Having nuclear test on wed---pt is on lasix---pt is to call and give [place] an update on the medication prior to the test

## 2014-01-06 NOTE — Telephone Encounter (Signed)
Pt calling to let Dr Caryl Comes know that taking lasix as instructed at 12/18/13 office visit with Dr Caryl Comes did not change his symptoms. Will forward to Dr Caryl Comes for review

## 2014-01-08 ENCOUNTER — Other Ambulatory Visit: Payer: Self-pay

## 2014-01-08 ENCOUNTER — Other Ambulatory Visit: Payer: Medicare Other

## 2014-01-08 ENCOUNTER — Encounter (HOSPITAL_COMMUNITY): Payer: Medicare Other

## 2014-01-08 MED ORDER — CINACALCET HCL 30 MG PO TABS: 30.0000 mg | ORAL_TABLET | Freq: Every day | ORAL | Status: DC

## 2014-01-08 NOTE — Telephone Encounter (Signed)
[facility]

## 2014-01-10 NOTE — Telephone Encounter (Signed)
Per Dr. Caryl Comes more diuretic, but need lab work first. Pt requesting labs to be drawn in [place]. Will confirm w/ Caryl Comes on Monday and fax orders. Pt agreeable to plan.

## 2014-01-13 ENCOUNTER — Telehealth: Payer: Self-pay | Admitting: Internal Medicine

## 2014-01-13 ENCOUNTER — Other Ambulatory Visit: Payer: Self-pay | Admitting: *Deleted

## 2014-01-13 DIAGNOSIS — H26499 Other secondary cataract, unspecified eye: Secondary | ICD-10-CM | POA: Diagnosis not present

## 2014-01-13 NOTE — Telephone Encounter (Signed)
Faxed BMET orders to Dr. Laqueta Due office - for pt to stop by their office to have lab work drawn (pt requested not to drive all the way here for lab work).  Notified pt to call their office and schedule lab draw (per their request), pt agreeable to plan.

## 2014-01-13 NOTE — Telephone Encounter (Signed)
New message     Returned a nurses call from [employer]

## 2014-01-13 NOTE — Telephone Encounter (Signed)
A user error has taken place: encounter opened in error, closed for administrative reasons.   (see 2/16 telephone note)

## 2014-01-14 ENCOUNTER — Encounter: Payer: Self-pay | Admitting: Internal Medicine

## 2014-01-14 DIAGNOSIS — Z79899 Other long term (current) drug therapy: Secondary | ICD-10-CM | POA: Diagnosis not present

## 2014-01-22 ENCOUNTER — Ambulatory Visit (HOSPITAL_COMMUNITY): Payer: Medicare Other | Attending: Internal Medicine | Admitting: Radiology

## 2014-01-22 ENCOUNTER — Other Ambulatory Visit: Payer: Medicare Other

## 2014-01-22 VITALS — BP 137/55 | Ht 73.5 in | Wt 383.0 lb

## 2014-01-22 DIAGNOSIS — I10 Essential (primary) hypertension: Secondary | ICD-10-CM | POA: Insufficient documentation

## 2014-01-22 DIAGNOSIS — Z9861 Coronary angioplasty status: Secondary | ICD-10-CM | POA: Insufficient documentation

## 2014-01-22 DIAGNOSIS — Z8249 Family history of ischemic heart disease and other diseases of the circulatory system: Secondary | ICD-10-CM | POA: Insufficient documentation

## 2014-01-22 DIAGNOSIS — Z95810 Presence of automatic (implantable) cardiac defibrillator: Secondary | ICD-10-CM | POA: Insufficient documentation

## 2014-01-22 DIAGNOSIS — I5032 Chronic diastolic (congestive) heart failure: Secondary | ICD-10-CM

## 2014-01-22 DIAGNOSIS — R0989 Other specified symptoms and signs involving the circulatory and respiratory systems: Principal | ICD-10-CM | POA: Insufficient documentation

## 2014-01-22 DIAGNOSIS — I2510 Atherosclerotic heart disease of native coronary artery without angina pectoris: Secondary | ICD-10-CM | POA: Insufficient documentation

## 2014-01-22 DIAGNOSIS — I4891 Unspecified atrial fibrillation: Secondary | ICD-10-CM | POA: Diagnosis not present

## 2014-01-22 DIAGNOSIS — I255 Ischemic cardiomyopathy: Secondary | ICD-10-CM

## 2014-01-22 DIAGNOSIS — R0609 Other forms of dyspnea: Secondary | ICD-10-CM | POA: Diagnosis not present

## 2014-01-22 DIAGNOSIS — R0602 Shortness of breath: Secondary | ICD-10-CM | POA: Diagnosis not present

## 2014-01-22 DIAGNOSIS — Z87891 Personal history of nicotine dependence: Secondary | ICD-10-CM | POA: Diagnosis not present

## 2014-01-22 DIAGNOSIS — E785 Hyperlipidemia, unspecified: Secondary | ICD-10-CM | POA: Insufficient documentation

## 2014-01-22 MED ORDER — TECHNETIUM TC 99M SESTAMIBI GENERIC - CARDIOLITE
30.0000 | Freq: Once | INTRAVENOUS | Status: AC | PRN
  Administered 2014-01-22: 30 via INTRAVENOUS

## 2014-01-22 MED ORDER — ADENOSINE (DIAGNOSTIC) 3 MG/ML IV SOLN
0.5600 mg/kg | Freq: Once | INTRAVENOUS | Status: AC
  Administered 2014-01-22: 60 mg via INTRAVENOUS

## 2014-01-22 NOTE — Progress Notes (Signed)
[facility] [address] [place], [place] [address] [phone]    Cardiology Nuclear Med Study  Caleb Lyons is a 80 y.o. male     MRN : [id number]     DOB: Apr 06, 1930  Procedure Date: 01/22/2014  Nuclear Med Background Indication for Stress Test:  Evaluation for Ischemia and Graft Patency History:  CAD;Cath 2011' ,EF:30-35%;CABG 3/11;AICD (complete HB)Afib;Echo 2011',EF:nml per Dr.Klein,-no report;Previous Nuclear Study 2011,Ischemia EF:33% Cardiac Risk Factors: Family History - CAD, History of Smoking, Hypertension and Lipids  Symptoms:  DOE and SOB   Nuclear Pre-Procedure Caffeine/Decaff Intake:  None > 12 hrs NPO After: 7:00pm   Lungs:  clear O2 Sat: 96% on room air. IV 0.9% NS with Angio Cath:  20g  IV Site: R Antecubital x 1, tolerated well IV Started by:  Irven Baltimore, RN  Chest Size (in):  64 Cup Size: n/a  Height: 6' 1.5" (1.867 m)  Weight:  383 lb (173.728 kg)  BMI:  Body mass index is 49.84 kg/(m^2). Tech Comments:  On Toprol as usual per patient    Nuclear Med Study 1 or 2 day study: 2 day  Stress Test Type:  Adenosine  Reading MD: N/A  Order Authorizing Provider:  Virl Axe, MD  Resting Radionuclide: Technetium 68m Sestamibi  Resting Radionuclide Dose: 33.0 mCi on 01/28/14   Stress Radionuclide:  Technetium 2m Sestamibi  Stress Radionuclide Dose: 33.0 mCi on 01/22/14           Stress Protocol Rest HR: 60 Stress HR: 74  Rest BP: 137/55 Stress BP: 116/65  Exercise Time (min): n/a METS: n/a   Predicted Max HR: 139 bpm % Max HR: 53.24 bpm Rate Pressure Product: 10138   Dose of Adenosine (mg):  60 Dose of Lexiscan: n/a mg  Dose of Atropine (mg): n/a Dose of Dobutamine: n/a mcg/kg/min (at max HR)  Stress Test Technologist: Ileene Hutchinson, EMT-P  Nuclear Technologist:  Annye Rusk, CNMT     Rest Procedure:  Myocardial perfusion imaging was performed at rest 45 minutes following the intravenous administration of Technetium  26m Sestamibi. Rest ECG: Ventricular paced rhythm  Stress Procedure:  The patient received IV adenosine at 140 mcg/kg/min for 4 minutes.  Technetium 58m Sestamibi was injected at the 2 minute mark and quantitative spect images were obtained after a 45 minute delay. Stress ECG: No significant change from baseline ECG  QPS Raw Data Images:  Normal; no motion artifact; normal heart/lung ratio. Stress Images:  Large perfusion defect involving inferior and apical and septal myocardium. Rest Images:   Large perfusion defect involving inferior and apical and septal myocardium. Subtraction (SDS):  There is a fixed defect that is most consistent with a previous infarction. Transient Ischemic Dilatation (Normal <1.22):  1.07 Lung/Heart Ratio (Normal <0.45):  0.37  Quantitative Gated Spect Images QGS EDV:  219 ml QGS ESV:  143 ml  Impression Exercise Capacity:  Adenosine study with no exercise. BP Response:  Hypotensive blood pressure response. Clinical Symptoms:  No chest pain. ECG Impression:  Baseline:  LBBB.  EKG uninterpretable due to LBBB at rest and stress. Comparison with Prior Nuclear Study: No significant change from previous study. EF on prior study of 11/25/09 was 33%  Overall Impression:  High risk stress nuclear study.  Large inferior wall scar. Large apical scar. No reversible ischemia. SDS is 1.  LV Ejection Fraction: 35%.  LV Wall Motion:  Apical akinesis  Caleb Lyons

## 2014-01-27 NOTE — Telephone Encounter (Signed)
Called pt after receiving lab results. Pt states he is feeling fine, denies DOE, denies edema. No further follow up needed at this time.

## 2014-01-28 ENCOUNTER — Ambulatory Visit (HOSPITAL_COMMUNITY): Payer: Medicare Other | Attending: Internal Medicine

## 2014-01-28 DIAGNOSIS — R0989 Other specified symptoms and signs involving the circulatory and respiratory systems: Secondary | ICD-10-CM

## 2014-01-28 MED ORDER — TECHNETIUM TC 99M SESTAMIBI GENERIC - CARDIOLITE
33.0000 | Freq: Once | INTRAVENOUS | Status: AC | PRN
  Administered 2014-01-28: 33 via INTRAVENOUS

## 2014-02-28 DIAGNOSIS — M19039 Primary osteoarthritis, unspecified wrist: Secondary | ICD-10-CM | POA: Diagnosis not present

## 2014-02-28 DIAGNOSIS — M65849 Other synovitis and tenosynovitis, unspecified hand: Secondary | ICD-10-CM | POA: Diagnosis not present

## 2014-02-28 DIAGNOSIS — M65839 Other synovitis and tenosynovitis, unspecified forearm: Secondary | ICD-10-CM | POA: Diagnosis not present

## 2014-03-04 DIAGNOSIS — M25549 Pain in joints of unspecified hand: Secondary | ICD-10-CM | POA: Diagnosis not present

## 2014-03-24 ENCOUNTER — Encounter: Payer: Self-pay | Admitting: Internal Medicine

## 2014-03-24 ENCOUNTER — Ambulatory Visit (INDEPENDENT_AMBULATORY_CARE_PROVIDER_SITE_OTHER): Payer: Medicare Other | Admitting: *Deleted

## 2014-03-24 DIAGNOSIS — I428 Other cardiomyopathies: Secondary | ICD-10-CM | POA: Diagnosis not present

## 2014-03-24 DIAGNOSIS — I442 Atrioventricular block, complete: Secondary | ICD-10-CM | POA: Diagnosis not present

## 2014-03-24 DIAGNOSIS — I509 Heart failure, unspecified: Secondary | ICD-10-CM

## 2014-03-28 DIAGNOSIS — R1310 Dysphagia, unspecified: Secondary | ICD-10-CM | POA: Diagnosis not present

## 2014-03-28 DIAGNOSIS — R21 Rash and other nonspecific skin eruption: Secondary | ICD-10-CM | POA: Diagnosis not present

## 2014-03-28 DIAGNOSIS — M109 Gout, unspecified: Secondary | ICD-10-CM | POA: Diagnosis not present

## 2014-03-28 DIAGNOSIS — M25549 Pain in joints of unspecified hand: Secondary | ICD-10-CM | POA: Diagnosis not present

## 2014-03-29 LAB — MDC_IDC_ENUM_SESS_TYPE_REMOTE
Battery Remaining Longevity: 55 mo
Battery Remaining Percentage: 54 %
Battery Voltage: 2.93 V
Brady Statistic RA Percent Paced: 42 %
Date Time Interrogation Session: 20150504092624
HIGH POWER IMPEDANCE MEASURED VALUE: 46 Ohm
Implantable Pulse Generator Serial Number: 600199
Lead Channel Impedance Value: 400 Ohm
Lead Channel Pacing Threshold Amplitude: 0.625 V
Lead Channel Pacing Threshold Amplitude: 0.75 V
Lead Channel Pacing Threshold Pulse Width: 0.5 ms
Lead Channel Sensing Intrinsic Amplitude: 7.1 mV
Lead Channel Setting Pacing Amplitude: 1 V
Lead Channel Setting Pacing Amplitude: 1.625
Lead Channel Setting Pacing Pulse Width: 0.5 ms
Lead Channel Setting Sensing Sensitivity: 2 mV
MDC IDC MSMT LEADCHNL RA SENSING INTR AMPL: 4.2 mV
MDC IDC MSMT LEADCHNL RV IMPEDANCE VALUE: 360 Ohm
MDC IDC MSMT LEADCHNL RV PACING THRESHOLD PULSEWIDTH: 0.5 ms
MDC IDC SET ZONE DETECTION INTERVAL: 350 ms
MDC IDC STAT BRADY AP VP PERCENT: 43 %
MDC IDC STAT BRADY AP VS PERCENT: 1 %
MDC IDC STAT BRADY AS VP PERCENT: 56 %
MDC IDC STAT BRADY AS VS PERCENT: 1 %
MDC IDC STAT BRADY RV PERCENT PACED: 99 %
Zone Setting Detection Interval: 300 ms

## 2014-04-02 ENCOUNTER — Ambulatory Visit: Payer: Medicare Other | Admitting: Endocrinology

## 2014-04-04 NOTE — Progress Notes (Signed)
Remote ICD transmission.   

## 2014-04-07 DIAGNOSIS — Z125 Encounter for screening for malignant neoplasm of prostate: Secondary | ICD-10-CM | POA: Diagnosis not present

## 2014-04-07 DIAGNOSIS — R351 Nocturia: Secondary | ICD-10-CM | POA: Diagnosis not present

## 2014-04-07 DIAGNOSIS — N138 Other obstructive and reflux uropathy: Secondary | ICD-10-CM | POA: Diagnosis not present

## 2014-04-07 DIAGNOSIS — N401 Enlarged prostate with lower urinary tract symptoms: Secondary | ICD-10-CM | POA: Diagnosis not present

## 2014-04-09 ENCOUNTER — Encounter: Payer: Self-pay | Admitting: Endocrinology

## 2014-04-09 ENCOUNTER — Ambulatory Visit (INDEPENDENT_AMBULATORY_CARE_PROVIDER_SITE_OTHER): Payer: Medicare Other | Admitting: Endocrinology

## 2014-04-09 ENCOUNTER — Ambulatory Visit: Payer: Medicare Other | Admitting: Endocrinology

## 2014-04-09 VITALS — BP 116/74 | HR 82 | Temp 98.2°F | Ht 73.5 in | Wt 383.0 lb

## 2014-04-09 DIAGNOSIS — E210 Primary hyperparathyroidism: Secondary | ICD-10-CM | POA: Diagnosis not present

## 2014-04-09 DIAGNOSIS — I2589 Other forms of chronic ischemic heart disease: Secondary | ICD-10-CM | POA: Diagnosis not present

## 2014-04-09 NOTE — Progress Notes (Signed)
Subjective:    Patient ID: Caleb Lyons, male    DOB: June 15, 1930, 80 y.o.   MRN: [id number]  HPI Pt returns for f/u of primary hyperparathyroidism (dx'ed 2012, on a routine blood test).  He has no indications for for surgery.  No recent bony fracture (as a young adult, he fx several arms and legs, playing football).  No h/o urolithiasis.  pt states has diffuse arthralgias.   Past Medical History  Diagnosis Date  . Complete heart block   . Ischemic cardiomyopathy     s/pCABG, Graft patent 3/11; EF 30-35%  . PAF (paroxysmal atrial fibrillation)   . Obesity        . Sinus node dysfunction   . HTN (hypertension)   . Impotence of organic origin   . Hyperparathyroidism   . Dual implantable cardiac defibrillator in situ     [employer] Judes  . OSA (obstructive sleep apnea)   . History of chicken pox   . Type II or unspecified type diabetes mellitus without mention of complication, not stated as uncontrolled   . HLD (hyperlipidemia)     Past Surgical History  Procedure Laterality Date  . Coronary artery bypass graft    . Tonsillectomy      History   Social History  . Marital Status: Married    Spouse Name: N/A    Number of Children: N/A  . Years of Education: 16   Occupational History  . Retired    Social History Main Topics  . Smoking status: Never Smoker   . Smokeless tobacco: Never Used  . Alcohol Use: Yes  . Drug Use: No  . Sexual Activity: Not on file   Other Topics Concern  . Not on file   Social History Narrative   Regular exercise-yes    Current Outpatient Prescriptions on File Prior to Visit  Medication Sig Dispense Refill  . aspirin EC 81 MG tablet Take 1 tablet (81 mg total) by mouth daily.  150 tablet  2  . atorvastatin (LIPITOR) 40 MG tablet Take 40 mg by mouth daily.        . cinacalcet (SENSIPAR) 30 MG tablet Take 1 tablet (30 mg total) by mouth daily with breakfast.  90 tablet  0  . Dutasteride-Tamsulosin HCl (JALYN) 0.5-0.4 MG CAPS Take 1 capsule by  mouth daily.        . furosemide (LASIX) 20 MG tablet Take 1 tablet (20 mg total) by mouth daily for 3 days. Then take 1 tablet (20 my total) by mouth every other day for 10 days.  8 tablet  0  . Glucos-Chond-Sterol-Fish Oil (GLUCOSAMINE CHONDROITIN PLUS PO) Take 1 tablet by mouth 2 (two) times daily.      Marland Kitchen levothyroxine (SYNTHROID, LEVOTHROID) 175 MCG tablet Take 175 mcg by mouth daily.        . metoprolol (TOPROL-XL) 50 MG 24 hr tablet Take 50 mg by mouth daily.        . Multiple Vitamin (MULTIVITAMIN) tablet Take 1 tablet by mouth daily.        . NON FORMULARY Super Beta Prostate 600mg   2 by mouth once daily       . Tamsulosin HCl (FLOMAX) 0.4 MG CAPS Take 0.4 mg by mouth daily.      . valsartan-hydrochlorothiazide (DIOVAN-HCT) 160-25 MG per tablet Take 1 tablet by mouth daily.      . vitamin C (ASCORBIC ACID) 500 MG tablet Take 500 mg by mouth daily.  No current facility-administered medications on file prior to visit.    No Known Allergies  Family History  Problem Relation Age of Onset  . Early death Father   . Stroke Father     BP 116/74  Pulse 82  Temp(Src) 98.2 F (36.8 C) (Oral)  Ht 6' 1.5" (1.867 m)  Wt 383 lb (173.728 kg)  BMI 49.84 kg/m2  SpO2 92%   Review of Systems He has constipation.     Objective:   Physical Exam VITAL SIGNS:  See vs page. GENERAL: no distress. Gait: steady with a cane. Lab Results  Component Value Date   PTH 67.3 04/09/2014   CALCIUM 9.9 04/09/2014       Assessment & Plan:  Primary hyperparathyroidism: much better.  The reason for this improvement is unclear, but he is at risk for recurrence  Patient Instructions  blood tests are being requested for you today.  We'll contact you with results.   Let's also check a bone-density test, at [facility].

## 2014-04-09 NOTE — Patient Instructions (Addendum)
blood tests are being requested for you today.  We'll contact you with results.   Let's also check a bone-density test, at [facility].

## 2014-04-10 DIAGNOSIS — K222 Esophageal obstruction: Secondary | ICD-10-CM | POA: Diagnosis not present

## 2014-04-10 DIAGNOSIS — Z951 Presence of aortocoronary bypass graft: Secondary | ICD-10-CM | POA: Diagnosis not present

## 2014-04-10 DIAGNOSIS — Z950 Presence of cardiac pacemaker: Secondary | ICD-10-CM | POA: Diagnosis not present

## 2014-04-10 DIAGNOSIS — R1310 Dysphagia, unspecified: Secondary | ICD-10-CM | POA: Diagnosis not present

## 2014-04-10 LAB — PTH, INTACT AND CALCIUM
Calcium: 9.9 mg/dL (ref 8.4–10.5)
PTH: 67.3 pg/mL (ref 14.0–72.0)

## 2014-04-10 LAB — VITAMIN D 25 HYDROXY (VIT D DEFICIENCY, FRACTURES): VIT D 25 HYDROXY: 47 ng/mL (ref 30–89)

## 2014-04-18 ENCOUNTER — Encounter: Payer: Self-pay | Admitting: Cardiology

## 2014-04-18 DIAGNOSIS — E039 Hypothyroidism, unspecified: Secondary | ICD-10-CM | POA: Diagnosis not present

## 2014-04-18 DIAGNOSIS — Z79899 Other long term (current) drug therapy: Secondary | ICD-10-CM | POA: Diagnosis not present

## 2014-04-18 DIAGNOSIS — I10 Essential (primary) hypertension: Secondary | ICD-10-CM | POA: Diagnosis not present

## 2014-04-18 DIAGNOSIS — M109 Gout, unspecified: Secondary | ICD-10-CM | POA: Diagnosis not present

## 2014-04-18 DIAGNOSIS — E785 Hyperlipidemia, unspecified: Secondary | ICD-10-CM | POA: Diagnosis not present

## 2014-04-21 ENCOUNTER — Telehealth: Payer: Self-pay | Admitting: Endocrinology

## 2014-04-21 MED ORDER — CINACALCET HCL 30 MG PO TABS: 30.0000 mg | ORAL_TABLET | Freq: Every day | ORAL | Status: DC

## 2014-04-21 NOTE — Telephone Encounter (Signed)
Rx faxed to pharmacy  

## 2014-04-21 NOTE — Telephone Encounter (Signed)
[facility]  Rx: Sensipar   Thank You :)

## 2014-04-25 DIAGNOSIS — E210 Primary hyperparathyroidism: Secondary | ICD-10-CM | POA: Diagnosis not present

## 2014-04-25 DIAGNOSIS — Z13820 Encounter for screening for osteoporosis: Secondary | ICD-10-CM | POA: Diagnosis not present

## 2014-04-28 DIAGNOSIS — R1310 Dysphagia, unspecified: Secondary | ICD-10-CM | POA: Diagnosis not present

## 2014-04-28 DIAGNOSIS — K222 Esophageal obstruction: Secondary | ICD-10-CM | POA: Diagnosis not present

## 2014-05-07 DIAGNOSIS — M1990 Unspecified osteoarthritis, unspecified site: Secondary | ICD-10-CM | POA: Diagnosis not present

## 2014-05-07 DIAGNOSIS — M25539 Pain in unspecified wrist: Secondary | ICD-10-CM | POA: Diagnosis not present

## 2014-05-07 DIAGNOSIS — R7989 Other specified abnormal findings of blood chemistry: Secondary | ICD-10-CM | POA: Diagnosis not present

## 2014-05-13 DIAGNOSIS — K2970 Gastritis, unspecified, without bleeding: Secondary | ICD-10-CM | POA: Diagnosis not present

## 2014-05-13 DIAGNOSIS — Z79899 Other long term (current) drug therapy: Secondary | ICD-10-CM | POA: Diagnosis not present

## 2014-05-13 DIAGNOSIS — I10 Essential (primary) hypertension: Secondary | ICD-10-CM | POA: Diagnosis not present

## 2014-05-13 DIAGNOSIS — R1310 Dysphagia, unspecified: Secondary | ICD-10-CM | POA: Diagnosis not present

## 2014-05-13 DIAGNOSIS — Z95810 Presence of automatic (implantable) cardiac defibrillator: Secondary | ICD-10-CM | POA: Diagnosis not present

## 2014-05-13 DIAGNOSIS — K222 Esophageal obstruction: Secondary | ICD-10-CM | POA: Diagnosis not present

## 2014-05-13 DIAGNOSIS — Z951 Presence of aortocoronary bypass graft: Secondary | ICD-10-CM | POA: Diagnosis not present

## 2014-06-25 ENCOUNTER — Ambulatory Visit (INDEPENDENT_AMBULATORY_CARE_PROVIDER_SITE_OTHER): Payer: Medicare Other | Admitting: *Deleted

## 2014-06-25 ENCOUNTER — Encounter: Payer: Self-pay | Admitting: Endocrinology

## 2014-06-25 DIAGNOSIS — I428 Other cardiomyopathies: Secondary | ICD-10-CM | POA: Diagnosis not present

## 2014-06-25 DIAGNOSIS — I5032 Chronic diastolic (congestive) heart failure: Secondary | ICD-10-CM | POA: Diagnosis not present

## 2014-06-25 NOTE — Progress Notes (Signed)
Remote ICD transmission.   

## 2014-06-26 DIAGNOSIS — M25539 Pain in unspecified wrist: Secondary | ICD-10-CM | POA: Diagnosis not present

## 2014-06-26 DIAGNOSIS — M1990 Unspecified osteoarthritis, unspecified site: Secondary | ICD-10-CM | POA: Diagnosis not present

## 2014-06-26 DIAGNOSIS — R7989 Other specified abnormal findings of blood chemistry: Secondary | ICD-10-CM | POA: Diagnosis not present

## 2014-06-30 LAB — MDC_IDC_ENUM_SESS_TYPE_REMOTE
Battery Remaining Longevity: 53 mo
Battery Remaining Percentage: 51 %
Battery Voltage: 2.93 V
Brady Statistic AS VP Percent: 59 %
Brady Statistic RV Percent Paced: 99 %
HIGH POWER IMPEDANCE MEASURED VALUE: 45 Ohm
Implantable Pulse Generator Serial Number: 600199
Lead Channel Impedance Value: 360 Ohm
Lead Channel Impedance Value: 390 Ohm
Lead Channel Pacing Threshold Amplitude: 0.625 V
Lead Channel Pacing Threshold Amplitude: 0.625 V
Lead Channel Pacing Threshold Pulse Width: 0.5 ms
Lead Channel Sensing Intrinsic Amplitude: 6.9 mV
Lead Channel Setting Pacing Amplitude: 0.875
Lead Channel Setting Pacing Amplitude: 1.625
Lead Channel Setting Pacing Pulse Width: 0.5 ms
MDC IDC MSMT LEADCHNL RA SENSING INTR AMPL: 4 mV
MDC IDC MSMT LEADCHNL RV PACING THRESHOLD PULSEWIDTH: 0.5 ms
MDC IDC SESS DTM: 20150805134950
MDC IDC SET LEADCHNL RV SENSING SENSITIVITY: 2 mV
MDC IDC SET ZONE DETECTION INTERVAL: 350 ms
MDC IDC STAT BRADY AP VP PERCENT: 40 %
MDC IDC STAT BRADY AP VS PERCENT: 1 %
MDC IDC STAT BRADY AS VS PERCENT: 1 %
MDC IDC STAT BRADY RA PERCENT PACED: 39 %
Zone Setting Detection Interval: 300 ms

## 2014-07-03 DIAGNOSIS — R1310 Dysphagia, unspecified: Secondary | ICD-10-CM | POA: Diagnosis not present

## 2014-07-03 DIAGNOSIS — K222 Esophageal obstruction: Secondary | ICD-10-CM | POA: Diagnosis not present

## 2014-07-15 ENCOUNTER — Encounter: Payer: Self-pay | Admitting: Cardiology

## 2014-07-21 ENCOUNTER — Encounter: Payer: Self-pay | Admitting: Internal Medicine

## 2014-07-29 DIAGNOSIS — I10 Essential (primary) hypertension: Secondary | ICD-10-CM | POA: Diagnosis not present

## 2014-07-29 DIAGNOSIS — E039 Hypothyroidism, unspecified: Secondary | ICD-10-CM | POA: Diagnosis not present

## 2014-07-29 DIAGNOSIS — I2510 Atherosclerotic heart disease of native coronary artery without angina pectoris: Secondary | ICD-10-CM | POA: Diagnosis not present

## 2014-07-29 DIAGNOSIS — M109 Gout, unspecified: Secondary | ICD-10-CM | POA: Diagnosis not present

## 2014-07-29 DIAGNOSIS — Z79899 Other long term (current) drug therapy: Secondary | ICD-10-CM | POA: Diagnosis not present

## 2014-07-29 DIAGNOSIS — M25549 Pain in joints of unspecified hand: Secondary | ICD-10-CM | POA: Diagnosis not present

## 2014-07-29 DIAGNOSIS — E213 Hyperparathyroidism, unspecified: Secondary | ICD-10-CM | POA: Diagnosis not present

## 2014-07-29 DIAGNOSIS — E785 Hyperlipidemia, unspecified: Secondary | ICD-10-CM | POA: Diagnosis not present

## 2014-09-17 DIAGNOSIS — R799 Abnormal finding of blood chemistry, unspecified: Secondary | ICD-10-CM | POA: Diagnosis not present

## 2014-09-17 DIAGNOSIS — M25531 Pain in right wrist: Secondary | ICD-10-CM | POA: Diagnosis not present

## 2014-09-17 DIAGNOSIS — M1990 Unspecified osteoarthritis, unspecified site: Secondary | ICD-10-CM | POA: Diagnosis not present

## 2014-09-25 ENCOUNTER — Ambulatory Visit (INDEPENDENT_AMBULATORY_CARE_PROVIDER_SITE_OTHER): Payer: Medicare Other | Admitting: *Deleted

## 2014-09-25 ENCOUNTER — Telehealth: Payer: Self-pay | Admitting: Cardiology

## 2014-09-25 DIAGNOSIS — I255 Ischemic cardiomyopathy: Secondary | ICD-10-CM | POA: Diagnosis not present

## 2014-09-25 DIAGNOSIS — I5032 Chronic diastolic (congestive) heart failure: Secondary | ICD-10-CM

## 2014-09-25 LAB — MDC_IDC_ENUM_SESS_TYPE_REMOTE
Battery Remaining Percentage: 48 %
Battery Voltage: 2.92 V
Brady Statistic AP VS Percent: 1 %
Brady Statistic AS VP Percent: 55 %
Brady Statistic RA Percent Paced: 43 %
Date Time Interrogation Session: 20151105162336
HIGH POWER IMPEDANCE MEASURED VALUE: 48 Ohm
HighPow Impedance: 46 Ohm
Implantable Pulse Generator Serial Number: 600199
Lead Channel Impedance Value: 360 Ohm
Lead Channel Pacing Threshold Pulse Width: 0.5 ms
Lead Channel Sensing Intrinsic Amplitude: 11.9 mV
MDC IDC MSMT BATTERY REMAINING LONGEVITY: 50 mo
MDC IDC MSMT LEADCHNL RA IMPEDANCE VALUE: 390 Ohm
MDC IDC MSMT LEADCHNL RA PACING THRESHOLD AMPLITUDE: 0.75 V
MDC IDC MSMT LEADCHNL RA PACING THRESHOLD PULSEWIDTH: 0.5 ms
MDC IDC MSMT LEADCHNL RA SENSING INTR AMPL: 3.6 mV
MDC IDC MSMT LEADCHNL RV PACING THRESHOLD AMPLITUDE: 0.625 V
MDC IDC SET LEADCHNL RA PACING AMPLITUDE: 1.75 V
MDC IDC SET LEADCHNL RV PACING AMPLITUDE: 0.875
MDC IDC SET LEADCHNL RV PACING PULSEWIDTH: 0.5 ms
MDC IDC SET LEADCHNL RV SENSING SENSITIVITY: 2 mV
MDC IDC STAT BRADY AP VP PERCENT: 44 %
MDC IDC STAT BRADY AS VS PERCENT: 1 %
MDC IDC STAT BRADY RV PERCENT PACED: 99 %
Zone Setting Detection Interval: 300 ms
Zone Setting Detection Interval: 350 ms

## 2014-09-25 NOTE — Progress Notes (Signed)
Remote ICD transmission.   

## 2014-09-25 NOTE — Telephone Encounter (Signed)
Spoke with pt and reminded pt of remote transmission that is due today. Pt verbalized understanding.   

## 2014-10-20 DIAGNOSIS — Z79899 Other long term (current) drug therapy: Secondary | ICD-10-CM | POA: Diagnosis not present

## 2014-10-20 DIAGNOSIS — M109 Gout, unspecified: Secondary | ICD-10-CM | POA: Diagnosis not present

## 2014-10-22 ENCOUNTER — Encounter: Payer: Self-pay | Admitting: Cardiology

## 2014-10-24 DIAGNOSIS — N401 Enlarged prostate with lower urinary tract symptoms: Secondary | ICD-10-CM | POA: Diagnosis not present

## 2014-10-24 DIAGNOSIS — R351 Nocturia: Secondary | ICD-10-CM | POA: Diagnosis not present

## 2014-10-27 ENCOUNTER — Encounter: Payer: Self-pay | Admitting: Internal Medicine

## 2014-12-19 ENCOUNTER — Encounter: Payer: Self-pay | Admitting: Internal Medicine

## 2014-12-19 ENCOUNTER — Ambulatory Visit (INDEPENDENT_AMBULATORY_CARE_PROVIDER_SITE_OTHER): Payer: Medicare Other | Admitting: Internal Medicine

## 2014-12-19 VITALS — BP 132/68 | HR 72 | Ht 73.0 in | Wt >= 6400 oz

## 2014-12-19 DIAGNOSIS — I255 Ischemic cardiomyopathy: Secondary | ICD-10-CM | POA: Diagnosis not present

## 2014-12-19 DIAGNOSIS — Z6841 Body Mass Index (BMI) 40.0 and over, adult: Secondary | ICD-10-CM | POA: Diagnosis not present

## 2014-12-19 DIAGNOSIS — Z4502 Encounter for adjustment and management of automatic implantable cardiac defibrillator: Secondary | ICD-10-CM

## 2014-12-19 DIAGNOSIS — I442 Atrioventricular block, complete: Secondary | ICD-10-CM | POA: Diagnosis not present

## 2014-12-19 DIAGNOSIS — I10 Essential (primary) hypertension: Secondary | ICD-10-CM | POA: Diagnosis not present

## 2014-12-19 DIAGNOSIS — I5020 Unspecified systolic (congestive) heart failure: Secondary | ICD-10-CM | POA: Diagnosis not present

## 2014-12-19 DIAGNOSIS — I5032 Chronic diastolic (congestive) heart failure: Secondary | ICD-10-CM | POA: Diagnosis not present

## 2014-12-19 LAB — MDC_IDC_ENUM_SESS_TYPE_INCLINIC
Brady Statistic RA Percent Paced: 41 %
Brady Statistic RV Percent Paced: 99 %
HighPow Impedance: 46 Ohm
HighPow Impedance: 46.3391
Implantable Pulse Generator Serial Number: 600199
Lead Channel Impedance Value: 375 Ohm
Lead Channel Impedance Value: 387.5 Ohm
Lead Channel Pacing Threshold Amplitude: 0.625 V
Lead Channel Pacing Threshold Amplitude: 0.875 V
Lead Channel Pacing Threshold Pulse Width: 0.5 ms
Lead Channel Sensing Intrinsic Amplitude: 12 mV
Lead Channel Setting Sensing Sensitivity: 2 mV
MDC IDC MSMT BATTERY REMAINING LONGEVITY: 48 mo
MDC IDC MSMT LEADCHNL RA PACING THRESHOLD PULSEWIDTH: 0.5 ms
MDC IDC MSMT LEADCHNL RA SENSING INTR AMPL: 4.1 mV
MDC IDC SESS DTM: 20160129091357
MDC IDC SET LEADCHNL RA PACING AMPLITUDE: 1.875
MDC IDC SET LEADCHNL RV PACING AMPLITUDE: 0.875
MDC IDC SET LEADCHNL RV PACING PULSEWIDTH: 0.5 ms
Zone Setting Detection Interval: 300 ms
Zone Setting Detection Interval: 350 ms

## 2014-12-19 NOTE — Progress Notes (Signed)
Patient Care Team: Ernestene Kiel, MD as PCP - General (Internal Medicine) Mauri Pole, MD (Orthopedic Surgery)   HPI  Caleb Lyons is a 81 y.o. male seen in followup for complete heart block and has  ischemic heart disease with prior bypass grafting . He presented 2011 with acute failure to pace via his pacemaker and underwent defibrillator generator upgrade to an ICD. It was elected because he had a class II symptoms to implant just  dual-chamber device.   DOE is acceptable  Edema is stable . He sleeps with CPAP  Cardiac catheterization January 2011 demonstrated patency of great with intercurrent deterioration of LVEF with an estimation about 30-35%.  Echocardiogram however 2011 demonstrated ejection fraction was normal Myoview scan 3/15 however confirm ejection fraction of 35%. It demonstrated a large inferior wall and apical scar without ischemia.      Past Medical History  Diagnosis Date  . Complete heart block   . Ischemic cardiomyopathy     s/pCABG, Graft patent 3/11; EF 30-35%  . PAF (paroxysmal atrial fibrillation)   . Obesity        . Sinus node dysfunction   . HTN (hypertension)   . Impotence of organic origin   . Hyperparathyroidism   . Dual implantable cardiac defibrillator in situ     [employer] Judes  . OSA (obstructive sleep apnea)   . History of chicken pox   . Type II or unspecified type diabetes mellitus without mention of complication, not stated as uncontrolled   . HLD (hyperlipidemia)     Past Surgical History  Procedure Laterality Date  . Coronary artery bypass graft    . Tonsillectomy      Current Outpatient Prescriptions  Medication Sig Dispense Refill  . allopurinol (ZYLOPRIM) 100 MG tablet Take 200 mg by mouth daily.    Marland Kitchen aspirin EC 81 MG tablet Take 1 tablet (81 mg total) by mouth daily. 150 tablet 2  . atorvastatin (LIPITOR) 40 MG tablet Take 40 mg by mouth daily.      . cinacalcet (SENSIPAR) 30 MG tablet Take 1 tablet (30 mg  total) by mouth daily with breakfast. 90 tablet 0  . colchicine 0.6 MG tablet Take 0.6 mg by mouth daily.    . Dutasteride-Tamsulosin HCl (JALYN) 0.5-0.4 MG CAPS Take 1 capsule by mouth daily.      . furosemide (LASIX) 40 MG tablet Take 40 mg by mouth daily.    . Glucos-Chond-Sterol-Fish Oil (GLUCOSAMINE CHONDROITIN PLUS PO) Take 1 tablet by mouth 2 (two) times daily.    . Glucosamine HCl (GLUCOSAMINE PO) Take 1 tablet by mouth daily.    Marland Kitchen levothyroxine (SYNTHROID, LEVOTHROID) 175 MCG tablet Take 175 mcg by mouth daily.      . metoprolol (TOPROL-XL) 50 MG 24 hr tablet Take 50 mg by mouth daily.      . Multiple Vitamin (MULTIVITAMIN) tablet Take 1 tablet by mouth daily.      . NON FORMULARY Super Beta Prostate 600mg   2 by mouth once daily     . potassium chloride (K-DUR,KLOR-CON) 10 MEQ tablet Take 10 mEq by mouth daily.    . Tamsulosin HCl (FLOMAX) 0.4 MG CAPS Take 0.4 mg by mouth daily.    . valsartan-hydrochlorothiazide (DIOVAN-HCT) 160-25 MG per tablet Take 1 tablet by mouth daily.    . vitamin C (ASCORBIC ACID) 500 MG tablet Take 500 mg by mouth daily.       No current facility-administered medications for  this visit.    No Known Allergies  Review of Systems negative except from HPI and PMH  Physical Exam BP 132/68 mmHg  Pulse 72  Ht 6\' 1"  (1.854 m)  Wt 400 lb 3.2 oz (181.53 kg)  BMI 52.81 kg/m2 Well developed and morbidly obese in no acute distress HENT normal E scleral and icterus clear Neck Supple JVP flat; carotids brisk and full Clear to ausculation Device pocket well healed; without hematoma or erythema.  There is no tethering regular rate and rhythm, no murmurs gallops or rub Soft with active bowel sounds No clubbing cyanosis 1+ Edema Alert and oriented, grossly normal motor and sensory function Skin Warm and Dry  ECG demonstrates sinus rhythm at 72 with P-synchronous/ AV  pacing   Assessment and  Plan  Ischemic cardiomyopathy  Complete heart  block  Hypertension  Implantable defibrillator-St. Jude The patient's device was interrogated.  The information was reviewed. No changes were made in the programming.     Syncope  Congestive heart failure-chronic-systolic  Morbidly obese  Without symptoms of ischemia  Relatively Euvolemic continue current meds  No intercurrent syncope  Blood pressure is reasonably controlled  We discussed the importance of exercise and noted that there has been a 30 pound weight gain in the last year.

## 2014-12-19 NOTE — Patient Instructions (Signed)

## 2015-01-15 DIAGNOSIS — L820 Inflamed seborrheic keratosis: Secondary | ICD-10-CM | POA: Diagnosis not present

## 2015-01-27 DIAGNOSIS — E785 Hyperlipidemia, unspecified: Secondary | ICD-10-CM | POA: Diagnosis not present

## 2015-01-27 DIAGNOSIS — Z79899 Other long term (current) drug therapy: Secondary | ICD-10-CM | POA: Diagnosis not present

## 2015-01-27 DIAGNOSIS — E039 Hypothyroidism, unspecified: Secondary | ICD-10-CM | POA: Diagnosis not present

## 2015-01-27 DIAGNOSIS — L989 Disorder of the skin and subcutaneous tissue, unspecified: Secondary | ICD-10-CM | POA: Diagnosis not present

## 2015-01-27 DIAGNOSIS — M109 Gout, unspecified: Secondary | ICD-10-CM | POA: Diagnosis not present

## 2015-01-27 DIAGNOSIS — I10 Essential (primary) hypertension: Secondary | ICD-10-CM | POA: Diagnosis not present

## 2015-03-13 DIAGNOSIS — E876 Hypokalemia: Secondary | ICD-10-CM | POA: Diagnosis not present

## 2015-03-23 ENCOUNTER — Ambulatory Visit (INDEPENDENT_AMBULATORY_CARE_PROVIDER_SITE_OTHER): Payer: Medicare Other | Admitting: *Deleted

## 2015-03-23 ENCOUNTER — Encounter: Payer: Self-pay | Admitting: Internal Medicine

## 2015-03-23 DIAGNOSIS — I255 Ischemic cardiomyopathy: Secondary | ICD-10-CM | POA: Diagnosis not present

## 2015-03-23 DIAGNOSIS — I5032 Chronic diastolic (congestive) heart failure: Secondary | ICD-10-CM | POA: Diagnosis not present

## 2015-03-24 NOTE — Progress Notes (Signed)
Remote ICD transmission.   

## 2015-03-26 LAB — CUP PACEART REMOTE DEVICE CHECK
Battery Remaining Longevity: 44 mo
Battery Remaining Percentage: 43 %
Brady Statistic AP VS Percent: 1 %
Brady Statistic AS VS Percent: 1 %
Brady Statistic RA Percent Paced: 36 %
Brady Statistic RV Percent Paced: 99 %
Date Time Interrogation Session: 20160502092219
HighPow Impedance: 46 Ohm
Lead Channel Impedance Value: 430 Ohm
Lead Channel Pacing Threshold Amplitude: 0.875 V
Lead Channel Pacing Threshold Pulse Width: 0.5 ms
Lead Channel Pacing Threshold Pulse Width: 0.5 ms
Lead Channel Setting Pacing Amplitude: 1 V
Lead Channel Setting Pacing Amplitude: 1.875
Lead Channel Setting Pacing Pulse Width: 0.5 ms
Lead Channel Setting Sensing Sensitivity: 2 mV
MDC IDC MSMT BATTERY VOLTAGE: 2.9 V
MDC IDC MSMT LEADCHNL RA SENSING INTR AMPL: 4.2 mV
MDC IDC MSMT LEADCHNL RV IMPEDANCE VALUE: 350 Ohm
MDC IDC MSMT LEADCHNL RV PACING THRESHOLD AMPLITUDE: 0.75 V
MDC IDC PG SERIAL: 600199
MDC IDC SET ZONE DETECTION INTERVAL: 350 ms
MDC IDC STAT BRADY AP VP PERCENT: 37 %
MDC IDC STAT BRADY AS VP PERCENT: 62 %
Zone Setting Detection Interval: 300 ms

## 2015-03-27 LAB — CUP PACEART REMOTE DEVICE CHECK
Battery Remaining Longevity: 44 mo
Battery Voltage: 2.9 V
Brady Statistic AP VS Percent: 1 %
Brady Statistic AS VS Percent: 1 %
HighPow Impedance: 46 Ohm
Lead Channel Impedance Value: 430 Ohm
Lead Channel Pacing Threshold Pulse Width: 0.5 ms
Lead Channel Setting Sensing Sensitivity: 2 mV
MDC IDC MSMT BATTERY REMAINING PERCENTAGE: 43 %
MDC IDC MSMT LEADCHNL RA PACING THRESHOLD AMPLITUDE: 0.875 V
MDC IDC MSMT LEADCHNL RA PACING THRESHOLD PULSEWIDTH: 0.5 ms
MDC IDC MSMT LEADCHNL RA SENSING INTR AMPL: 4.2 mV
MDC IDC MSMT LEADCHNL RV IMPEDANCE VALUE: 350 Ohm
MDC IDC MSMT LEADCHNL RV PACING THRESHOLD AMPLITUDE: 0.75 V
MDC IDC SESS DTM: 20160502092219
MDC IDC SET LEADCHNL RA PACING AMPLITUDE: 1.875
MDC IDC SET LEADCHNL RV PACING AMPLITUDE: 1 V
MDC IDC SET LEADCHNL RV PACING PULSEWIDTH: 0.5 ms
MDC IDC SET ZONE DETECTION INTERVAL: 300 ms
MDC IDC SET ZONE DETECTION INTERVAL: 350 ms
MDC IDC STAT BRADY AP VP PERCENT: 37 %
MDC IDC STAT BRADY AS VP PERCENT: 62 %
MDC IDC STAT BRADY RA PERCENT PACED: 36 %
MDC IDC STAT BRADY RV PERCENT PACED: 99 %
Pulse Gen Serial Number: 600199

## 2015-04-01 DIAGNOSIS — M150 Primary generalized (osteo)arthritis: Secondary | ICD-10-CM | POA: Diagnosis not present

## 2015-04-01 DIAGNOSIS — E790 Hyperuricemia without signs of inflammatory arthritis and tophaceous disease: Secondary | ICD-10-CM | POA: Diagnosis not present

## 2015-04-01 DIAGNOSIS — Z79899 Other long term (current) drug therapy: Secondary | ICD-10-CM | POA: Diagnosis not present

## 2015-04-01 DIAGNOSIS — R799 Abnormal finding of blood chemistry, unspecified: Secondary | ICD-10-CM | POA: Diagnosis not present

## 2015-04-13 ENCOUNTER — Encounter: Payer: Self-pay | Admitting: Cardiology

## 2015-04-13 DIAGNOSIS — H26492 Other secondary cataract, left eye: Secondary | ICD-10-CM | POA: Diagnosis not present

## 2015-05-27 DIAGNOSIS — N401 Enlarged prostate with lower urinary tract symptoms: Secondary | ICD-10-CM | POA: Diagnosis not present

## 2015-05-27 DIAGNOSIS — R351 Nocturia: Secondary | ICD-10-CM | POA: Diagnosis not present

## 2015-06-23 ENCOUNTER — Ambulatory Visit (INDEPENDENT_AMBULATORY_CARE_PROVIDER_SITE_OTHER): Payer: Medicare Other | Admitting: *Deleted

## 2015-06-23 DIAGNOSIS — I5032 Chronic diastolic (congestive) heart failure: Secondary | ICD-10-CM

## 2015-06-23 DIAGNOSIS — I255 Ischemic cardiomyopathy: Secondary | ICD-10-CM

## 2015-06-24 NOTE — Progress Notes (Signed)
Remote ICD transmission.   

## 2015-06-29 LAB — CUP PACEART REMOTE DEVICE CHECK
Battery Voltage: 2.9 V
Brady Statistic AP VP Percent: 31 %
Brady Statistic AP VS Percent: 1 %
Brady Statistic AS VP Percent: 68 %
Brady Statistic AS VS Percent: 1 %
Brady Statistic RA Percent Paced: 31 %
Date Time Interrogation Session: 20160802132607
HighPow Impedance: 48 Ohm
Lead Channel Impedance Value: 410 Ohm
Lead Channel Pacing Threshold Amplitude: 0.625 V
Lead Channel Pacing Threshold Amplitude: 0.75 V
Lead Channel Pacing Threshold Pulse Width: 0.5 ms
Lead Channel Pacing Threshold Pulse Width: 0.5 ms
Lead Channel Sensing Intrinsic Amplitude: 12 mV
Lead Channel Setting Pacing Amplitude: 0.875
Lead Channel Setting Pacing Amplitude: 1.75 V
MDC IDC MSMT BATTERY REMAINING LONGEVITY: 42 mo
MDC IDC MSMT BATTERY REMAINING PERCENTAGE: 40 %
MDC IDC MSMT LEADCHNL RA SENSING INTR AMPL: 4.6 mV
MDC IDC MSMT LEADCHNL RV IMPEDANCE VALUE: 360 Ohm
MDC IDC SET LEADCHNL RV PACING PULSEWIDTH: 0.5 ms
MDC IDC SET LEADCHNL RV SENSING SENSITIVITY: 2 mV
MDC IDC SET ZONE DETECTION INTERVAL: 300 ms
MDC IDC STAT BRADY RV PERCENT PACED: 99 %
Pulse Gen Serial Number: 600199
Zone Setting Detection Interval: 350 ms

## 2015-07-06 DIAGNOSIS — E039 Hypothyroidism, unspecified: Secondary | ICD-10-CM | POA: Diagnosis not present

## 2015-07-06 DIAGNOSIS — Z79899 Other long term (current) drug therapy: Secondary | ICD-10-CM | POA: Diagnosis not present

## 2015-07-06 DIAGNOSIS — E785 Hyperlipidemia, unspecified: Secondary | ICD-10-CM | POA: Diagnosis not present

## 2015-07-06 DIAGNOSIS — K219 Gastro-esophageal reflux disease without esophagitis: Secondary | ICD-10-CM | POA: Diagnosis not present

## 2015-07-06 DIAGNOSIS — M109 Gout, unspecified: Secondary | ICD-10-CM | POA: Diagnosis not present

## 2015-07-06 DIAGNOSIS — I10 Essential (primary) hypertension: Secondary | ICD-10-CM | POA: Diagnosis not present

## 2015-07-31 ENCOUNTER — Encounter: Payer: Self-pay | Admitting: Cardiology

## 2015-08-11 ENCOUNTER — Encounter: Payer: Self-pay | Admitting: Internal Medicine

## 2015-08-18 DIAGNOSIS — M25562 Pain in left knee: Secondary | ICD-10-CM | POA: Diagnosis not present

## 2015-08-18 DIAGNOSIS — R262 Difficulty in walking, not elsewhere classified: Secondary | ICD-10-CM | POA: Diagnosis not present

## 2015-08-18 DIAGNOSIS — M25561 Pain in right knee: Secondary | ICD-10-CM | POA: Diagnosis not present

## 2015-08-18 DIAGNOSIS — M170 Bilateral primary osteoarthritis of knee: Secondary | ICD-10-CM | POA: Diagnosis not present

## 2015-08-26 DIAGNOSIS — M25561 Pain in right knee: Secondary | ICD-10-CM | POA: Diagnosis not present

## 2015-08-26 DIAGNOSIS — R2681 Unsteadiness on feet: Secondary | ICD-10-CM | POA: Diagnosis not present

## 2015-08-26 DIAGNOSIS — M1711 Unilateral primary osteoarthritis, right knee: Secondary | ICD-10-CM | POA: Diagnosis not present

## 2015-08-26 DIAGNOSIS — M25562 Pain in left knee: Secondary | ICD-10-CM | POA: Diagnosis not present

## 2015-08-26 DIAGNOSIS — M170 Bilateral primary osteoarthritis of knee: Secondary | ICD-10-CM | POA: Diagnosis not present

## 2015-09-02 DIAGNOSIS — R2681 Unsteadiness on feet: Secondary | ICD-10-CM | POA: Diagnosis not present

## 2015-09-02 DIAGNOSIS — M170 Bilateral primary osteoarthritis of knee: Secondary | ICD-10-CM | POA: Diagnosis not present

## 2015-09-02 DIAGNOSIS — M25562 Pain in left knee: Secondary | ICD-10-CM | POA: Diagnosis not present

## 2015-09-02 DIAGNOSIS — M25561 Pain in right knee: Secondary | ICD-10-CM | POA: Diagnosis not present

## 2015-09-02 DIAGNOSIS — M1712 Unilateral primary osteoarthritis, left knee: Secondary | ICD-10-CM | POA: Diagnosis not present

## 2015-09-03 DIAGNOSIS — E039 Hypothyroidism, unspecified: Secondary | ICD-10-CM | POA: Diagnosis not present

## 2015-09-08 DIAGNOSIS — M25561 Pain in right knee: Secondary | ICD-10-CM | POA: Diagnosis not present

## 2015-09-08 DIAGNOSIS — M25562 Pain in left knee: Secondary | ICD-10-CM | POA: Diagnosis not present

## 2015-09-08 DIAGNOSIS — R269 Unspecified abnormalities of gait and mobility: Secondary | ICD-10-CM | POA: Diagnosis not present

## 2015-09-08 DIAGNOSIS — M1711 Unilateral primary osteoarthritis, right knee: Secondary | ICD-10-CM | POA: Diagnosis not present

## 2015-09-08 DIAGNOSIS — M170 Bilateral primary osteoarthritis of knee: Secondary | ICD-10-CM | POA: Diagnosis not present

## 2015-09-10 DIAGNOSIS — L918 Other hypertrophic disorders of the skin: Secondary | ICD-10-CM | POA: Diagnosis not present

## 2015-09-10 DIAGNOSIS — L820 Inflamed seborrheic keratosis: Secondary | ICD-10-CM | POA: Diagnosis not present

## 2015-09-10 DIAGNOSIS — L814 Other melanin hyperpigmentation: Secondary | ICD-10-CM | POA: Diagnosis not present

## 2015-09-24 ENCOUNTER — Ambulatory Visit (INDEPENDENT_AMBULATORY_CARE_PROVIDER_SITE_OTHER): Payer: Medicare Other | Admitting: *Deleted

## 2015-09-24 DIAGNOSIS — I5032 Chronic diastolic (congestive) heart failure: Secondary | ICD-10-CM | POA: Diagnosis not present

## 2015-09-24 DIAGNOSIS — I255 Ischemic cardiomyopathy: Secondary | ICD-10-CM | POA: Diagnosis not present

## 2015-09-25 NOTE — Progress Notes (Signed)
Remote ICD transmission.   

## 2015-10-14 ENCOUNTER — Encounter: Payer: Self-pay | Admitting: Cardiology

## 2015-10-14 LAB — CUP PACEART REMOTE DEVICE CHECK
Battery Remaining Percentage: 39 %
Battery Voltage: 2.89 V
Brady Statistic AP VP Percent: 23 %
Brady Statistic AP VS Percent: 1 %
Brady Statistic AS VP Percent: 77 %
Brady Statistic AS VS Percent: 1 %
Brady Statistic RA Percent Paced: 22 %
Brady Statistic RV Percent Paced: 99 %
HighPow Impedance: 47 Ohm
Implantable Lead Implant Date: 20020930
Implantable Lead Location: 753858
Implantable Lead Location: 753859
Implantable Lead Location: 753860
Lead Channel Impedance Value: 400 Ohm
Lead Channel Pacing Threshold Amplitude: 0.625 V
Lead Channel Pacing Threshold Pulse Width: 0.5 ms
Lead Channel Pacing Threshold Pulse Width: 0.5 ms
Lead Channel Sensing Intrinsic Amplitude: 12 mV
Lead Channel Setting Pacing Amplitude: 0.875
Lead Channel Setting Pacing Amplitude: 1.75 V
Lead Channel Setting Pacing Pulse Width: 0.5 ms
Lead Channel Setting Sensing Sensitivity: 2 mV
MDC IDC LEAD IMPLANT DT: 20020930
MDC IDC LEAD IMPLANT DT: 20110303
MDC IDC LEAD MODEL: 7121
MDC IDC MSMT BATTERY REMAINING LONGEVITY: 41 mo
MDC IDC MSMT LEADCHNL RA PACING THRESHOLD AMPLITUDE: 0.75 V
MDC IDC MSMT LEADCHNL RA SENSING INTR AMPL: 4.4 mV
MDC IDC MSMT LEADCHNL RV IMPEDANCE VALUE: 350 Ohm
MDC IDC PG SERIAL: 600199
MDC IDC SESS DTM: 20161102143556

## 2015-11-17 DIAGNOSIS — L820 Inflamed seborrheic keratosis: Secondary | ICD-10-CM | POA: Diagnosis not present

## 2015-11-27 DIAGNOSIS — Z125 Encounter for screening for malignant neoplasm of prostate: Secondary | ICD-10-CM | POA: Diagnosis not present

## 2015-11-27 DIAGNOSIS — N401 Enlarged prostate with lower urinary tract symptoms: Secondary | ICD-10-CM | POA: Diagnosis not present

## 2015-11-27 DIAGNOSIS — R351 Nocturia: Secondary | ICD-10-CM | POA: Diagnosis not present

## 2015-12-23 ENCOUNTER — Encounter: Payer: Self-pay | Admitting: Internal Medicine

## 2015-12-23 ENCOUNTER — Ambulatory Visit (INDEPENDENT_AMBULATORY_CARE_PROVIDER_SITE_OTHER): Payer: Medicare Other | Admitting: Internal Medicine

## 2015-12-23 VITALS — BP 158/88 | HR 84 | Ht 73.0 in | Wt >= 6400 oz

## 2015-12-23 DIAGNOSIS — I255 Ischemic cardiomyopathy: Secondary | ICD-10-CM

## 2015-12-23 DIAGNOSIS — I5032 Chronic diastolic (congestive) heart failure: Secondary | ICD-10-CM | POA: Diagnosis not present

## 2015-12-23 DIAGNOSIS — I442 Atrioventricular block, complete: Secondary | ICD-10-CM

## 2015-12-23 LAB — CUP PACEART INCLINIC DEVICE CHECK
Battery Remaining Longevity: 38.4
HighPow Impedance: 48.3585
Implantable Lead Implant Date: 20020930
Implantable Lead Implant Date: 20110303
Implantable Lead Location: 753858
Implantable Lead Location: 753860
Implantable Lead Model: 7121
Lead Channel Pacing Threshold Amplitude: 0.75 V
Lead Channel Pacing Threshold Amplitude: 0.75 V
Lead Channel Pacing Threshold Amplitude: 0.75 V
Lead Channel Pacing Threshold Pulse Width: 0.5 ms
Lead Channel Pacing Threshold Pulse Width: 0.5 ms
Lead Channel Pacing Threshold Pulse Width: 0.5 ms
Lead Channel Pacing Threshold Pulse Width: 0.5 ms
Lead Channel Sensing Intrinsic Amplitude: 5 mV
Lead Channel Setting Pacing Amplitude: 0.875
Lead Channel Setting Pacing Pulse Width: 0.5 ms
MDC IDC LEAD IMPLANT DT: 20020930
MDC IDC LEAD LOCATION: 753859
MDC IDC MSMT LEADCHNL RA IMPEDANCE VALUE: 400 Ohm
MDC IDC MSMT LEADCHNL RA PACING THRESHOLD AMPLITUDE: 0.75 V
MDC IDC MSMT LEADCHNL RV IMPEDANCE VALUE: 362.5 Ohm
MDC IDC MSMT LEADCHNL RV SENSING INTR AMPL: 12 mV
MDC IDC PG SERIAL: 600199
MDC IDC SESS DTM: 20170201160906
MDC IDC SET LEADCHNL RA PACING AMPLITUDE: 1.75 V
MDC IDC SET LEADCHNL RV SENSING SENSITIVITY: 2 mV
MDC IDC STAT BRADY RA PERCENT PACED: 18 %
MDC IDC STAT BRADY RV PERCENT PACED: 99 %

## 2015-12-23 NOTE — Progress Notes (Signed)
Patient Care Team: Ernestene Kiel, MD as PCP - General (Internal Medicine) Paralee Cancel, MD (Orthopedic Surgery)   HPI  Caleb Lyons is a 82 y.o. male seen in followup for complete heart block and has  ischemic heart disease with prior bypass grafting . He presented 2011 with acute failure to pace via his pacemaker and underwent defibrillator generator upgrade to an ICD. It was elected because he had a class II symptoms to implant just  dual-chamber device.   DOE is acceptable  Edema is stable . He sleeps with CPAP   Cardiac catheterization January 2011 demonstrated patency of great with intercurrent deterioration of LVEF with an estimation about 30-35%.  Echocardiogram however 2011 demonstrated ejection fraction was normal  Myoview scan 3/15 however confirm ejection fraction of 35%. It demonstrated a large inferior wall and apical scar without ischemia.  He continues to gain weight.      Past Medical History  Diagnosis Date  . Complete heart block ([place])   . Ischemic cardiomyopathy     s/pCABG, Graft patent 3/11; EF 30-35%  . PAF (paroxysmal atrial fibrillation) ([place])   . Obesity        . Sinus node dysfunction ([facility])   . HTN (hypertension)   . Impotence of organic origin   . Hyperparathyroidism   . Dual implantable cardiac defibrillator in situ     [employer] Judes  . OSA (obstructive sleep apnea)   . History of chicken pox   . Type II or unspecified type diabetes mellitus without mention of complication, not stated as uncontrolled   . HLD (hyperlipidemia)     Past Surgical History  Procedure Laterality Date  . Coronary artery bypass graft    . Tonsillectomy      Current Outpatient Prescriptions  Medication Sig Dispense Refill  . allopurinol (ZYLOPRIM) 100 MG tablet Take 200 mg by mouth daily.    Marland Kitchen aspirin EC 81 MG tablet Take 1 tablet (81 mg total) by mouth daily. 150 tablet 2  . atorvastatin (LIPITOR) 40 MG tablet Take 40 mg by mouth daily.      .  cinacalcet (SENSIPAR) 30 MG tablet Take 1 tablet (30 mg total) by mouth daily with breakfast. 90 tablet 0  . colchicine 0.6 MG tablet Take 0.6 mg by mouth daily.    . Dutasteride-Tamsulosin HCl (JALYN) 0.5-0.4 MG CAPS Take 1 capsule by mouth daily.      . furosemide (LASIX) 40 MG tablet Take 40 mg by mouth daily.    . Glucos-Chond-Sterol-Fish Oil (GLUCOSAMINE CHONDROITIN PLUS PO) Take 1 tablet by mouth daily.     . Glucosamine HCl (GLUCOSAMINE PO) Take 1 tablet by mouth 2 (two) times daily.     Marland Kitchen levothyroxine (SYNTHROID, LEVOTHROID) 175 MCG tablet Take 175 mcg by mouth daily.      . metoprolol (TOPROL-XL) 50 MG 24 hr tablet Take 50 mg by mouth daily.      . Multiple Vitamin (MULTIVITAMIN) tablet Take 1 tablet by mouth daily.      . NON FORMULARY Take 2 tablets by mouth daily. Super Beta Prostate 600mg   2 by mouth once daily    . potassium chloride (K-DUR,KLOR-CON) 10 MEQ tablet Take 10 mEq by mouth daily.    . Tamsulosin HCl (FLOMAX) 0.4 MG CAPS Take 0.4 mg by mouth daily.    . valsartan-hydrochlorothiazide (DIOVAN-HCT) 160-25 MG per tablet Take 1 tablet by mouth daily.    . vitamin C (ASCORBIC ACID) 500 MG tablet  Take 500 mg by mouth daily.      Marland Kitchen allopurinol (ZYLOPRIM) 300 MG tablet Take 300 mg by mouth daily.    Nicolette Bang 25 MG suppository Use as directed     No current facility-administered medications for this visit.    No Known Allergies  Review of Systems negative except from HPI and PMH  Physical Exam BP 158/88 mmHg  Pulse 84  Ht 6\' 1"  (1.854 m)  Wt 429 lb (194.593 kg)  BMI 56.61 kg/m2 Well developed and morbidly obese in no acute distress HENT normal E scleral and icterus clear Neck Supple JVP flat; carotids brisk and full Clear to ausculation Device pocket well healed; without hematoma or erythema.  There is no tethering regular rate and rhythm, no murmurs gallops or rub Soft with active bowel sounds No clubbing cyanosis tr Edema Alert and oriented, grossly  normal motor and sensory function Skin Warm and Dry  ECG demonstrates sinus rhythm at 84 with P-synchronous/ AV  pacing   Assessment and  Plan  Ischemic cardiomyopathy  Complete heart block  Hypertension  Implantable defibrillator-St. Jude The patient's device was interrogated.  The information was reviewed. No changes were made in the programming.     Syncope  Congestive heart failure-chronic-systolic  Morbidly obese  Without symptoms of ischemia  Relatively Euvolemic continue current meds  No intercurrent syncope   Blood pressure is elevated today but at home he has been okay  We discussed his weight gain. It is 30 pounds over the last year 50 pounds almost over the last 18 months. At this rate, it will be an overwhelming issue potentially even precluding device generator replacement. We have discussed a variety of strategies, small plates, food off the table, and then also whether meeting with Dr. Sandrea Hughs might afford other opportunities

## 2015-12-23 NOTE — Patient Instructions (Signed)
Medication Instructions: - Your physician recommends that you continue on your current medications as directed. Please refer to the Current Medication list given to you today.   Labwork: - none  Procedures/Testing: - none  Follow-Up: - Remote monitoring is used to monitor your Pacemaker of ICD from home. This monitoring reduces the number of office visits required to check your device to one time per year. It allows [place] to keep an eye on the functioning of your device to ensure it is working properly. You are scheduled for a device check from home on 03/23/16. You may send your transmission at any time that day. If you have a wireless device, the transmission will be sent automatically. After your physician reviews your transmission, you will receive a postcard with your next transmission date.  - Your physician wants you to follow-up in: 1 year with Dr. Caryl Comes. You will receive a reminder letter in the mail two months in advance. If you don't receive a letter, please call our office to schedule the follow-up appointment.   Any Additional Special Instructions Will Be Listed Below (If Applicable).     If you need a refill on your cardiac medications before your next appointment, please call your pharmacy.

## 2016-01-06 DIAGNOSIS — E039 Hypothyroidism, unspecified: Secondary | ICD-10-CM | POA: Diagnosis not present

## 2016-01-06 DIAGNOSIS — E785 Hyperlipidemia, unspecified: Secondary | ICD-10-CM | POA: Diagnosis not present

## 2016-01-06 DIAGNOSIS — I10 Essential (primary) hypertension: Secondary | ICD-10-CM | POA: Diagnosis not present

## 2016-01-06 DIAGNOSIS — Z79899 Other long term (current) drug therapy: Secondary | ICD-10-CM | POA: Diagnosis not present

## 2016-03-22 DIAGNOSIS — L03032 Cellulitis of left toe: Secondary | ICD-10-CM | POA: Diagnosis not present

## 2016-03-23 ENCOUNTER — Ambulatory Visit (INDEPENDENT_AMBULATORY_CARE_PROVIDER_SITE_OTHER): Payer: Medicare Other | Admitting: *Deleted

## 2016-03-23 DIAGNOSIS — I255 Ischemic cardiomyopathy: Secondary | ICD-10-CM | POA: Diagnosis not present

## 2016-03-24 NOTE — Progress Notes (Signed)
Remote ICD transmission.   

## 2016-04-11 DIAGNOSIS — H26492 Other secondary cataract, left eye: Secondary | ICD-10-CM | POA: Diagnosis not present

## 2016-04-29 ENCOUNTER — Encounter: Payer: Self-pay | Admitting: Cardiology

## 2016-05-03 LAB — CUP PACEART REMOTE DEVICE CHECK
Battery Voltage: 2.87 V
Brady Statistic AP VP Percent: 12 %
Brady Statistic AP VS Percent: 1 %
Brady Statistic AS VP Percent: 85 %
HighPow Impedance: 48 Ohm
Implantable Lead Implant Date: 20020930
Implantable Lead Location: 753858
Implantable Lead Location: 753859
Implantable Lead Model: 7121
Lead Channel Impedance Value: 390 Ohm
Lead Channel Pacing Threshold Amplitude: 0.5 V
Lead Channel Pacing Threshold Pulse Width: 0.5 ms
Lead Channel Sensing Intrinsic Amplitude: 4.3 mV
Lead Channel Setting Pacing Amplitude: 0.75 V
Lead Channel Setting Pacing Pulse Width: 0.5 ms
Lead Channel Setting Sensing Sensitivity: 2 mV
MDC IDC LEAD IMPLANT DT: 20020930
MDC IDC LEAD IMPLANT DT: 20110303
MDC IDC LEAD LOCATION: 753860
MDC IDC MSMT BATTERY REMAINING LONGEVITY: 36 mo
MDC IDC MSMT BATTERY REMAINING PERCENTAGE: 34 %
MDC IDC MSMT LEADCHNL RA PACING THRESHOLD AMPLITUDE: 0.75 V
MDC IDC MSMT LEADCHNL RA PACING THRESHOLD PULSEWIDTH: 0.5 ms
MDC IDC MSMT LEADCHNL RV IMPEDANCE VALUE: 400 Ohm
MDC IDC MSMT LEADCHNL RV SENSING INTR AMPL: 12 mV
MDC IDC PG SERIAL: 600199
MDC IDC SESS DTM: 20170503072306
MDC IDC SET LEADCHNL RA PACING AMPLITUDE: 1.75 V
MDC IDC STAT BRADY AS VS PERCENT: 1.7 %
MDC IDC STAT BRADY RA PERCENT PACED: 9.7 %
MDC IDC STAT BRADY RV PERCENT PACED: 97 %

## 2016-05-27 DIAGNOSIS — N401 Enlarged prostate with lower urinary tract symptoms: Secondary | ICD-10-CM | POA: Diagnosis not present

## 2016-05-27 DIAGNOSIS — R351 Nocturia: Secondary | ICD-10-CM | POA: Diagnosis not present

## 2016-06-05 ENCOUNTER — Inpatient Hospital Stay (HOSPITAL_COMMUNITY)
Admission: EM | Admit: 2016-06-05 | Discharge: 2016-06-07 | DRG: 065 | Disposition: A | Discharge status: Home / Self Care | Payer: Medicare Other | Attending: Internal Medicine | Admitting: Internal Medicine

## 2016-06-05 ENCOUNTER — Inpatient Hospital Stay (HOSPITAL_COMMUNITY): Payer: Medicare Other

## 2016-06-05 DIAGNOSIS — E1159 Type 2 diabetes mellitus with other circulatory complications: Secondary | ICD-10-CM | POA: Insufficient documentation

## 2016-06-05 DIAGNOSIS — H4903 Third [oculomotor] nerve palsy, bilateral: Secondary | ICD-10-CM

## 2016-06-05 DIAGNOSIS — R29701 NIHSS score 1: Secondary | ICD-10-CM | POA: Diagnosis present

## 2016-06-05 DIAGNOSIS — H4900 Third [oculomotor] nerve palsy, unspecified eye: Secondary | ICD-10-CM | POA: Diagnosis present

## 2016-06-05 DIAGNOSIS — G4733 Obstructive sleep apnea (adult) (pediatric): Secondary | ICD-10-CM | POA: Diagnosis present

## 2016-06-05 DIAGNOSIS — Z79899 Other long term (current) drug therapy: Secondary | ICD-10-CM | POA: Diagnosis not present

## 2016-06-05 DIAGNOSIS — Z7982 Long term (current) use of aspirin: Secondary | ICD-10-CM

## 2016-06-05 DIAGNOSIS — M109 Gout, unspecified: Secondary | ICD-10-CM | POA: Diagnosis present

## 2016-06-05 DIAGNOSIS — R2689 Other abnormalities of gait and mobility: Secondary | ICD-10-CM | POA: Diagnosis not present

## 2016-06-05 DIAGNOSIS — I6523 Occlusion and stenosis of bilateral carotid arteries: Secondary | ICD-10-CM | POA: Diagnosis not present

## 2016-06-05 DIAGNOSIS — Z6841 Body Mass Index (BMI) 40.0 and over, adult: Secondary | ICD-10-CM | POA: Diagnosis not present

## 2016-06-05 DIAGNOSIS — I480 Paroxysmal atrial fibrillation: Secondary | ICD-10-CM | POA: Diagnosis present

## 2016-06-05 DIAGNOSIS — Z66 Do not resuscitate: Secondary | ICD-10-CM | POA: Diagnosis present

## 2016-06-05 DIAGNOSIS — H5123 Internuclear ophthalmoplegia, bilateral: Secondary | ICD-10-CM | POA: Diagnosis not present

## 2016-06-05 DIAGNOSIS — I10 Essential (primary) hypertension: Secondary | ICD-10-CM | POA: Insufficient documentation

## 2016-06-05 DIAGNOSIS — I2510 Atherosclerotic heart disease of native coronary artery without angina pectoris: Secondary | ICD-10-CM | POA: Diagnosis present

## 2016-06-05 DIAGNOSIS — H538 Other visual disturbances: Secondary | ICD-10-CM | POA: Diagnosis present

## 2016-06-05 DIAGNOSIS — H499 Unspecified paralytic strabismus: Secondary | ICD-10-CM | POA: Diagnosis present

## 2016-06-05 DIAGNOSIS — I69398 Other sequelae of cerebral infarction: Secondary | ICD-10-CM | POA: Diagnosis not present

## 2016-06-05 DIAGNOSIS — E785 Hyperlipidemia, unspecified: Secondary | ICD-10-CM | POA: Diagnosis present

## 2016-06-05 DIAGNOSIS — H5347 Heteronymous bilateral field defects: Secondary | ICD-10-CM | POA: Diagnosis not present

## 2016-06-05 DIAGNOSIS — I639 Cerebral infarction, unspecified: Secondary | ICD-10-CM | POA: Diagnosis not present

## 2016-06-05 DIAGNOSIS — I6322 Cerebral infarction due to unspecified occlusion or stenosis of basilar arteries: Secondary | ICD-10-CM | POA: Diagnosis not present

## 2016-06-05 DIAGNOSIS — R531 Weakness: Secondary | ICD-10-CM | POA: Diagnosis not present

## 2016-06-05 DIAGNOSIS — Z7401 Bed confinement status: Secondary | ICD-10-CM | POA: Diagnosis not present

## 2016-06-05 DIAGNOSIS — Z95810 Presence of automatic (implantable) cardiac defibrillator: Secondary | ICD-10-CM | POA: Diagnosis not present

## 2016-06-05 DIAGNOSIS — R079 Chest pain, unspecified: Secondary | ICD-10-CM | POA: Diagnosis not present

## 2016-06-05 DIAGNOSIS — I6302 Cerebral infarction due to thrombosis of basilar artery: Principal | ICD-10-CM | POA: Insufficient documentation

## 2016-06-05 DIAGNOSIS — Z951 Presence of aortocoronary bypass graft: Secondary | ICD-10-CM

## 2016-06-05 DIAGNOSIS — Z823 Family history of stroke: Secondary | ICD-10-CM | POA: Diagnosis not present

## 2016-06-05 DIAGNOSIS — I6529 Occlusion and stenosis of unspecified carotid artery: Secondary | ICD-10-CM | POA: Insufficient documentation

## 2016-06-05 DIAGNOSIS — H519 Unspecified disorder of binocular movement: Secondary | ICD-10-CM | POA: Diagnosis not present

## 2016-06-05 DIAGNOSIS — I25709 Atherosclerosis of coronary artery bypass graft(s), unspecified, with unspecified angina pectoris: Secondary | ICD-10-CM | POA: Insufficient documentation

## 2016-06-05 DIAGNOSIS — R29818 Other symptoms and signs involving the nervous system: Secondary | ICD-10-CM | POA: Diagnosis not present

## 2016-06-05 DIAGNOSIS — G459 Transient cerebral ischemic attack, unspecified: Secondary | ICD-10-CM | POA: Diagnosis not present

## 2016-06-05 LAB — BASIC METABOLIC PANEL
ANION GAP: 8 (ref 5–15)
BUN: 17 mg/dL (ref 6–20)
CALCIUM: 9.9 mg/dL (ref 8.9–10.3)
CHLORIDE: 103 mmol/L (ref 101–111)
CO2: 28 mmol/L (ref 22–32)
Creatinine, Ser: 1.03 mg/dL (ref 0.61–1.24)
GFR calc Af Amer: >60 mL/min (ref >60)
GFR calc non Af Amer: >60 mL/min (ref >60)
GLUCOSE: 115 mg/dL — AB (ref 65–99)
Potassium: 3.9 mmol/L (ref 3.5–5.1)
Sodium: 139 mmol/L (ref 135–145)

## 2016-06-05 LAB — CBC
HEMATOCRIT: 44.4 % (ref 39.0–52.0)
HEMOGLOBIN: 15 g/dL (ref 13.0–17.0)
MCH: 33.9 pg (ref 26.0–34.0)
MCHC: 33.8 g/dL (ref 30.0–36.0)
MCV: 100.2 fL — AB (ref 78.0–100.0)
Platelets: 181 10*3/uL (ref 150–400)
RBC: 4.43 MIL/uL (ref 4.22–5.81)
RDW: 13.7 % (ref 11.5–15.5)
WBC: 7.8 10*3/uL (ref 4.0–10.5)

## 2016-06-05 MED ORDER — SODIUM CHLORIDE 0.9 % IV BOLUS (SEPSIS)
250.0000 mL | Freq: Once | INTRAVENOUS | Status: AC
  Administered 2016-06-05: 250 mL via INTRAVENOUS

## 2016-06-05 MED ORDER — DUTASTERIDE 0.5 MG PO CAPS
0.5000 mg | ORAL_CAPSULE | Freq: Every day | ORAL | Status: DC
  Administered 2016-06-06 – 2016-06-07 (×2): 0.5 mg via ORAL
  Filled 2016-06-05 (×2): qty 1

## 2016-06-05 MED ORDER — TAMSULOSIN HCL 0.4 MG PO CAPS
0.4000 mg | ORAL_CAPSULE | Freq: Every day | ORAL | Status: DC
  Administered 2016-06-06 – 2016-06-07 (×2): 0.4 mg via ORAL
  Filled 2016-06-05 (×2): qty 1

## 2016-06-05 MED ORDER — ACETAMINOPHEN 650 MG RE SUPP: 650.0000 mg | RECTAL | Status: DC | PRN

## 2016-06-05 MED ORDER — STROKE: EARLY STAGES OF RECOVERY BOOK
Freq: Once | Status: AC
  Administered 2016-06-06: 05:00:00
  Filled 2016-06-05: qty 1

## 2016-06-05 MED ORDER — DUTASTERIDE-TAMSULOSIN HCL 0.5-0.4 MG PO CAPS: 1.0000 | ORAL_CAPSULE | Freq: Every day | ORAL | Status: DC

## 2016-06-05 MED ORDER — CINACALCET HCL 30 MG PO TABS
30.0000 mg | ORAL_TABLET | Freq: Every day | ORAL | Status: DC
  Administered 2016-06-06 – 2016-06-07 (×2): 30 mg via ORAL
  Filled 2016-06-05 (×2): qty 1

## 2016-06-05 MED ORDER — METOPROLOL SUCCINATE ER 25 MG PO TB24: 50.0000 mg | ORAL_TABLET | Freq: Every day | ORAL | Status: DC

## 2016-06-05 MED ORDER — IOPAMIDOL (ISOVUE-370) INJECTION 76%
INTRAVENOUS | Status: AC
  Filled 2016-06-05: qty 50

## 2016-06-05 MED ORDER — ENOXAPARIN SODIUM 40 MG/0.4ML ~~LOC~~ SOLN
40.0000 mg | SUBCUTANEOUS | Status: DC
  Administered 2016-06-05 – 2016-06-06 (×2): 40 mg via SUBCUTANEOUS
  Filled 2016-06-05 (×2): qty 0.4

## 2016-06-05 MED ORDER — IOPAMIDOL (ISOVUE-370) INJECTION 76%
INTRAVENOUS | Status: AC
  Administered 2016-06-05: 140 mL
  Filled 2016-06-05: qty 100

## 2016-06-05 MED ORDER — ACETAMINOPHEN 325 MG PO TABS: 650.0000 mg | ORAL_TABLET | ORAL | Status: DC | PRN

## 2016-06-05 MED ORDER — ALLOPURINOL 100 MG PO TABS
200.0000 mg | ORAL_TABLET | Freq: Every day | ORAL | Status: DC
  Administered 2016-06-06 – 2016-06-07 (×2): 200 mg via ORAL
  Filled 2016-06-05 (×2): qty 2

## 2016-06-05 MED ORDER — LEVOTHYROXINE SODIUM 175 MCG PO TABS
175.0000 ug | ORAL_TABLET | Freq: Every day | ORAL | Status: DC
  Administered 2016-06-06 – 2016-06-07 (×2): 175 ug via ORAL
  Filled 2016-06-05 (×2): qty 1

## 2016-06-05 MED ORDER — FUROSEMIDE 20 MG PO TABS: 40.0000 mg | ORAL_TABLET | Freq: Every day | ORAL | Status: DC

## 2016-06-05 MED ORDER — ASPIRIN EC 81 MG PO TBEC
81.0000 mg | DELAYED_RELEASE_TABLET | Freq: Every day | ORAL | Status: DC
  Administered 2016-06-06 – 2016-06-07 (×2): 81 mg via ORAL
  Filled 2016-06-05 (×2): qty 1

## 2016-06-05 MED ORDER — ATORVASTATIN CALCIUM 40 MG PO TABS
40.0000 mg | ORAL_TABLET | Freq: Every day | ORAL | Status: DC
  Administered 2016-06-06 – 2016-06-07 (×2): 40 mg via ORAL
  Filled 2016-06-05 (×2): qty 1

## 2016-06-05 MED ORDER — VALSARTAN-HYDROCHLOROTHIAZIDE 160-25 MG PO TABS: 1.0000 | ORAL_TABLET | Freq: Every day | ORAL | Status: DC

## 2016-06-05 MED ORDER — SENNOSIDES-DOCUSATE SODIUM 8.6-50 MG PO TABS: 1.0000 | ORAL_TABLET | Freq: Every evening | ORAL | Status: DC | PRN

## 2016-06-05 NOTE — Consult Note (Signed)
CC:  Chief Complaint  Patient presents with  . Blurred Vision    HPI: Caleb Lyons is a 82 y.o. male w/ POH of CE/IOL OU and RD s/p repair OD ~20 years ago here for evaluation of stroke rule out and decreased peripheral vision OU. Reports can't the bottom of either eye - started 8 AM this morning. Since day progressed peripheral vision impairment about the same. Difficult w/ balance also started this AM. Report leaning to the left.     ROS: Denies diplopia. Denies jaw claudication. Denies scalp tenderness. Denies unintentional weight loss. Denies facial droop or slurring of speech. Denies headache. Denies weakness.   Denies fever/chills, chest pain, irregular heart rhythm, SOB, cough, wheezing, abdominal pain, melena, hematochezia, weakness, numbness, slurring of speech, facial droop, muscle weakness, joint pain, skin rash, tattoos, depressed mood  PMH: Past Medical History  Diagnosis Date  . Complete heart block ([place])   . Ischemic cardiomyopathy     s/pCABG, Graft patent 3/11; EF 30-35%  . PAF (paroxysmal atrial fibrillation) ([place])   . Obesity        . Sinus node dysfunction ([facility])   . HTN (hypertension)   . Impotence of organic origin   . Hyperparathyroidism   . Dual implantable cardiac defibrillator in situ     [facility]  . OSA (obstructive sleep apnea)   . History of chicken pox   . Type II or unspecified type diabetes mellitus without mention of complication, not stated as uncontrolled   . HLD (hyperlipidemia)     PSH: Past Surgical History  Procedure Laterality Date  . Coronary artery bypass graft    . Tonsillectomy      Meds: No current facility-administered medications on file prior to encounter.   Current Outpatient Prescriptions on File Prior to Encounter  Medication Sig Dispense Refill  . allopurinol (ZYLOPRIM) 100 MG tablet Take 200 mg by mouth daily.    Marland Kitchen allopurinol (ZYLOPRIM) 300 MG tablet Take 300 mg by mouth daily.    . ANUCORT-HC 25 MG suppository  Use as directed    . aspirin EC 81 MG tablet Take 1 tablet (81 mg total) by mouth daily. 150 tablet 2  . atorvastatin (LIPITOR) 40 MG tablet Take 40 mg by mouth daily.      . cinacalcet (SENSIPAR) 30 MG tablet Take 1 tablet (30 mg total) by mouth daily with breakfast. 90 tablet 0  . colchicine 0.6 MG tablet Take 0.6 mg by mouth daily.    . Dutasteride-Tamsulosin HCl (JALYN) 0.5-0.4 MG CAPS Take 1 capsule by mouth daily.      . furosemide (LASIX) 40 MG tablet Take 40 mg by mouth daily.    . Glucos-Chond-Sterol-Fish Oil (GLUCOSAMINE CHONDROITIN PLUS PO) Take 1 tablet by mouth daily.     . Glucosamine HCl (GLUCOSAMINE PO) Take 1 tablet by mouth 2 (two) times daily.     Marland Kitchen levothyroxine (SYNTHROID, LEVOTHROID) 175 MCG tablet Take 175 mcg by mouth daily.      . metoprolol (TOPROL-XL) 50 MG 24 hr tablet Take 50 mg by mouth daily.      . Multiple Vitamin (MULTIVITAMIN) tablet Take 1 tablet by mouth daily.      . NON FORMULARY Take 2 tablets by mouth daily. Super Beta Prostate 600mg   2 by mouth once daily    . potassium chloride (K-DUR,KLOR-CON) 10 MEQ tablet Take 10 mEq by mouth daily.    . Tamsulosin HCl (FLOMAX) 0.4 MG CAPS Take 0.4 mg by mouth  daily.    . valsartan-hydrochlorothiazide (DIOVAN-HCT) 160-25 MG per tablet Take 1 tablet by mouth daily.    . vitamin C (ASCORBIC ACID) 500 MG tablet Take 500 mg by mouth daily.        SH: Social History   Social History  . Marital Status: Married    Spouse Name: N/A  . Number of Children: N/A  . Years of Education: 16   Occupational History  . Retired    Social History Main Topics  . Smoking status: Never Smoker   . Smokeless tobacco: Never Used  . Alcohol Use: Yes  . Drug Use: No  . Sexual Activity: Not on file   Other Topics Concern  . Not on file   Social History Narrative   Regular exercise-yes    FH: Family History  Problem Relation Age of Onset  . Early death Father   . Stroke Father     Exam:  [name]: OD: 6pt cc +2.75   OS:  6pt cc +2.75   CVF: OD: full OS: full  EOM: OD: full adduction and abduction but some limitations of depression bilaterally, appears intact elevation OS: full adduction and abduction but some limitations of depression bilaterally, appears intact elevation  Pupils: OD: 3->2.5, no obvious APD OS: 2.75 mm -> minimally reactive, no obvious APD  IOP: by [place] OD: 14 OS:  16  External: OD: no periorbital edema, no proptosis, V1-V3 intact and symmetric, good orbicularis strength OS: no periorbital edema, no proptosis, V1-V3 intact and symmetric, good orbicularis strength    PF: 15/15  ULE: 5/5 - unable to look down  Pen Light Exam: L/L: OD: WNL OS: WNL  C/S: OD: white and quiet OS: white and quiet  K: OD: clear, no abnormal staining OS: clear, no abnormal staining  A/C: OD: grossly deep and quiet appearing by pen light OS: grossly deep and quiet appearing by pen light  I: OD: round and regular OS: round and regular  L: OD: PCIOL OS: PCIOL  DFE: dilated @ 3:00 PM w/ Tropic and Phenyl OU  V: OD: clear OS: clear  N: OD: C/D 0.4, no disc edema OS: C/D 0.4, no disc edema  M: OD: flat, no obvious macular pathology OS: flat, no obvious macular pathology  V: OD: normal appearing vessels OS: normal appearing vessels  P: OD: retina flat 360, no obvious mass/RT/RD - SB 360 w/ CRA ST and IT OS: retina flat 360, no obvious mass/RT/RD  A/P:  1.  Deficit of Down Gaze Bilaterally: - Concerning for stroke or vertebrobasilar insufficiency - Unable to get MRI due to pacemaker - recommend CTA to evaluate carotids and vertebral and basilar arteries - Would recommend admission for monitoring and stroke work-up   2. H/o RD s/p SB: - Retina flat w/o obvious RT/RD on examination  Christopher T. Manuella Ghazi, [place]  [phone]

## 2016-06-05 NOTE — Progress Notes (Signed)
Pt arrived floor with family. MR, night intern Inda Castle notified. Pt is comfortably situated in the room. Will continue to monitor.

## 2016-06-05 NOTE — ED Notes (Signed)
Pt in from [facility] ED, per report pt sent here for further eval, pt reports having blurred vision in the R eye onset today @ 8:30, pt had CT completed at [facility], pt reports hx of retinal detachment 20 yrs ago, pt A&O x4, follows commands, speaks in complete sentences

## 2016-06-05 NOTE — H&P (Signed)
Date: 06/05/2016               Patient Name:  Caleb Lyons MRN: [id number]  DOB: 09/14/1930 Age / Sex: 82 y.o., male   PCP: Ernestene Kiel, MD         Medical Service: Internal Medicine Teaching Service         Attending Physician: Dr. Oval Linsey, MD    First Contact: Dr. Asencion Partridge Pager: [phone]  Second Contact: Dr. Jacques Earthly Pager: [phone]       After Hours (After 5p/  First Contact Pager: [phone]  weekends / holidays): Second Contact Pager: [phone]   Chief Complaint: "Unable to look down"  History of Present Illness:  Caleb Lyons is a 82 y.o. gentleman with PMH complete heart block s/p ICD, ischemic cardiomyopathy s/p CABG (EF 30-35%), obesity, HTN, HLD, gout, hyperparathyroidism, OSA, and T2DM who presents with acute onset ophthalmoplegia since 8AM this morning when he sat down to watch TV. He suddenly noticed an inability to look down with either eye, and this has progressed to include inability to look up as well. Only medio-lateral eye movements are intact, and he has full visual acuity otherwise. He denies headache, weakness, dysarthria, numbness, or other symptoms aside from his visual difficulties. He had difficulties ambulating at home with his cane but this was due to visual problems rather than extremity weakness.   Meds: Current Facility-Administered Medications  Medication Dose Route Frequency Provider Last Rate Last Dose  .  stroke: mapping our early stages of recovery book   Does not apply Once Milagros Loll, MD      . acetaminophen (TYLENOL) tablet 650 mg  650 mg Oral Q4H PRN Milagros Loll, MD       Or  . acetaminophen (TYLENOL) suppository 650 mg  650 mg Rectal Q4H PRN Milagros Loll, MD      . Derrill Memo ON 06/06/2016] allopurinol (ZYLOPRIM) tablet 200 mg  200 mg Oral Daily Milagros Loll, MD      . aspirin EC tablet 81 mg  81 mg Oral Daily Milagros Loll, MD      . atorvastatin (LIPITOR) tablet 40 mg  40 mg Oral Daily Milagros Loll, MD      . Derrill Memo ON 06/06/2016] cinacalcet (SENSIPAR) tablet 30 mg  30 mg Oral Q breakfast Milagros Loll, MD      . Dutasteride-Tamsulosin HCl 0.5-0.4 MG CAPS 1 capsule  1 capsule Oral Daily Milagros Loll, MD      . enoxaparin (LOVENOX) injection 40 mg  40 mg Subcutaneous Q24H Milagros Loll, MD      . iopamidol (ISOVUE-370) 76 % injection           . iopamidol (ISOVUE-370) 76 % injection           . iopamidol (ISOVUE-370) 76 % injection           . [START ON 06/06/2016] levothyroxine (SYNTHROID, LEVOTHROID) tablet 175 mcg  175 mcg Oral QAC breakfast Milagros Loll, MD      . senna-docusate (Senokot-S) tablet 1 tablet  1 tablet Oral QHS PRN Milagros Loll, MD       Current Outpatient Prescriptions  Medication Sig Dispense Refill  . allopurinol (ZYLOPRIM) 100 MG tablet Take 200 mg by mouth daily.    . ANUCORT-HC 25 MG suppository Use as directed    . aspirin EC 81 MG tablet Take 1 tablet (81 mg total)  by mouth daily. 150 tablet 2  . atorvastatin (LIPITOR) 40 MG tablet Take 40 mg by mouth daily.      . cinacalcet (SENSIPAR) 30 MG tablet Take 1 tablet (30 mg total) by mouth daily with breakfast. 90 tablet 0  . colchicine 0.6 MG tablet Take 0.6 mg by mouth daily.    . Dutasteride-Tamsulosin HCl (JALYN) 0.5-0.4 MG CAPS Take 1 capsule by mouth daily.      . furosemide (LASIX) 40 MG tablet Take 40 mg by mouth daily.    . Glucosamine HCl (GLUCOSAMINE PO) Take 1 tablet by mouth 2 (two) times daily.     Marland Kitchen ibuprofen (ADVIL,MOTRIN) 200 MG tablet Take 800 mg by mouth every 8 (eight) hours as needed.    Marland Kitchen levothyroxine (SYNTHROID, LEVOTHROID) 175 MCG tablet Take 175 mcg by mouth daily.      . metoprolol (TOPROL-XL) 50 MG 24 hr tablet Take 50 mg by mouth daily.      . Multiple Vitamin (MULTIVITAMIN) tablet Take 1 tablet by mouth daily.      . NON FORMULARY Take 1 tablet by mouth 2 (two) times daily. Super Beta Prostate 600mg   1 cap by mouth once twice daily    . potassium chloride  (K-DUR,KLOR-CON) 10 MEQ tablet Take 10 mEq by mouth daily.    . Tamsulosin HCl (FLOMAX) 0.4 MG CAPS Take 0.4 mg by mouth daily.    . valsartan-hydrochlorothiazide (DIOVAN-HCT) 160-25 MG per tablet Take 1 tablet by mouth daily.    . vitamin C (ASCORBIC ACID) 500 MG tablet Take 500 mg by mouth daily.        Allergies: Allergies as of 06/05/2016  . (No Known Allergies)   Past Medical History  Diagnosis Date  . Complete heart block ([place])   . Ischemic cardiomyopathy     s/pCABG, Graft patent 3/11; EF 30-35%  . PAF (paroxysmal atrial fibrillation) ([place])   . Obesity        . Sinus node dysfunction ([facility])   . HTN (hypertension)   . Impotence of organic origin   . Hyperparathyroidism   . Dual implantable cardiac defibrillator in situ     [facility]  . OSA (obstructive sleep apnea)   . History of chicken pox   . Type II or unspecified type diabetes mellitus without mention of complication, not stated as uncontrolled   . HLD (hyperlipidemia)     Family History: Father died of MI in 33s, brother died of MI, another died of throat cancer, another died of GI cancer unspec.  Social History: He lives at home with his wife. He is a retired Designer, industrial/product. He's a never smoker, occasional drinker, and denies recreational drug use.  Patient is DNR-DNI  Review of Systems: A complete ROS was negative except as per HPI.   Physical Exam: Blood pressure 159/69, pulse 77, temperature 98.3 F (36.8 C), resp. rate 18, SpO2 95 %. General appearance: Obese elderly gentleman laying on ER stretcher, unable to look toward foot of bed without lifting head HENT: Normocephalic, atraumatic, no audible carotid bruit Cardiovascular: Regular rate and rhythm, ICD in place Respiratory: Clear to auscultation bilaterally, normal work of breathing Abdomen: Obese, soft, non-tender, non-distended Skin: Warm, moist, intact Neuro: Alert and oriented, anisocoria present with L>R, fixed/dilated (s/p dilated ophtho  eye exam), impaired EOM (no depression or elevation), visual acuity intact, cranial nerves otherwise intact, strength and sensation grossly intact, FNF intact bilaterally Psych: Normal affect  Assessment & Plan by Problem: Mr.  Vado is an 82 year old gentleman with extensive PMH detailed above including significant cardiovascular disease who presents with acute onset neurologic deficit most consistent with ischemic stroke.  1. Suspected stoke, outside TPA window, transferred from [place] for MRI but non-compatible ICD (called manufacturer)  - CTA head and neck tonight  - Stroke risk factor modification labs (lipids, HbA1c)  - PT/OT eval and treat tomorrow  - Have neuro weigh in tomorrow  Continue home medications.  FEN: Heart healthy  DVT ppx: Lovenox  Dispo: Admit patient to Inpatient with expected length of stay greater than 2 midnights.  Signed: Asencion Partridge, MD 06/05/2016, 7:24 PM  Pager: [phone]

## 2016-06-05 NOTE — ED Provider Notes (Signed)
CSN: [id number]     Arrival date & time 06/05/16  1432 History   First MD Initiated Contact with Patient 06/05/16 1447     Chief Complaint  Patient presents with  . Blurred Vision     (Consider location/radiation/quality/duration/timing/severity/associated sxs/prior Treatment) The history is provided by the patient.  Patient reports that at 8 AM this morning he noticed he was unable to look down. He subsequently then stumbling while he walks due to inability to see where he was going. Eyes any previous stroke but does endorse a history of four-vessel CABG. He denies any other cranial nerve deficits including trouble swallowing, trouble with other eye movements, trouble hearing, facial numbness, facial weakness or difficulties with balance. He denies any previous similar symptoms. He has not tried any specific therapies. No specific exacerbating or relieving factors. He first presented to [facility] for evaluation and was reportedly found to have a negative CT of the head there.  Past Medical History  Diagnosis Date  . Complete heart block ([place])   . Ischemic cardiomyopathy     s/pCABG, Graft patent 3/11; EF 30-35%  . PAF (paroxysmal atrial fibrillation) ([place])   . Obesity        . Sinus node dysfunction ([facility])   . HTN (hypertension)   . Impotence of organic origin   . Hyperparathyroidism   . Dual implantable cardiac defibrillator in situ     [place]  . OSA (obstructive sleep apnea)   . History of chicken pox   . Type II or unspecified type diabetes mellitus without mention of complication, not stated as uncontrolled   . HLD (hyperlipidemia)    Past Surgical History  Procedure Laterality Date  . Coronary artery bypass graft    . Tonsillectomy     Family History  Problem Relation Age of Onset  . Early death Father   . Stroke Father    Social History  Substance Use Topics  . Smoking status: Never Smoker   . Smokeless tobacco: Never Used  . Alcohol Use: Yes    Review of  Systems  Constitutional: Negative for fever, chills, diaphoresis and fatigue.  HENT: Negative for congestion.   Eyes: Positive for visual disturbance. Negative for photophobia.  Respiratory: Negative for cough, chest tightness and shortness of breath.   Cardiovascular: Negative for chest pain.  Gastrointestinal: Negative for nausea and abdominal pain.  Genitourinary: Negative for dysuria and flank pain.  Musculoskeletal: Negative for neck pain.  Skin: Negative for rash.  Neurological: Negative for weakness, numbness and headaches.  Psychiatric/Behavioral: Negative for confusion and agitation.      Allergies  Review of patient's allergies indicates no known allergies.  Home Medications   Prior to Admission medications   Medication Sig Start Date End Date Taking? Authorizing Provider  allopurinol (ZYLOPRIM) 100 MG tablet Take 200 mg by mouth daily.   Yes Historical Provider, MD  ANUCORT-HC 25 MG suppository Use as directed 12/11/15  Yes Historical Provider, MD  aspirin EC 81 MG tablet Take 1 tablet (81 mg total) by mouth daily. 11/29/11  Yes Deboraha Sprang, MD  atorvastatin (LIPITOR) 40 MG tablet Take 40 mg by mouth daily.     Yes Historical Provider, MD  cinacalcet (SENSIPAR) 30 MG tablet Take 1 tablet (30 mg total) by mouth daily with breakfast. 04/21/14  Yes Renato Shin, MD  colchicine 0.6 MG tablet Take 0.6 mg by mouth daily.   Yes Historical Provider, MD  Dutasteride-Tamsulosin HCl (JALYN) 0.5-0.4 MG CAPS Take 1 capsule  by mouth daily.     Yes Historical Provider, MD  furosemide (LASIX) 40 MG tablet Take 40 mg by mouth daily.   Yes Historical Provider, MD  Glucosamine HCl (GLUCOSAMINE PO) Take 1 tablet by mouth 2 (two) times daily.    Yes Historical Provider, MD  ibuprofen (ADVIL,MOTRIN) 200 MG tablet Take 800 mg by mouth every 8 (eight) hours as needed.   Yes Historical Provider, MD  levothyroxine (SYNTHROID, LEVOTHROID) 175 MCG tablet Take 175 mcg by mouth daily.     Yes Historical  Provider, MD  metoprolol (TOPROL-XL) 50 MG 24 hr tablet Take 50 mg by mouth daily.     Yes Historical Provider, MD  Multiple Vitamin (MULTIVITAMIN) tablet Take 1 tablet by mouth daily.     Yes Historical Provider, MD  NON FORMULARY Take 1 tablet by mouth 2 (two) times daily. Super Beta Prostate 600mg   1 cap by mouth once twice daily   Yes Historical Provider, MD  potassium chloride (K-DUR,KLOR-CON) 10 MEQ tablet Take 10 mEq by mouth daily.   Yes Historical Provider, MD  Tamsulosin HCl (FLOMAX) 0.4 MG CAPS Take 0.4 mg by mouth daily.   Yes Historical Provider, MD  valsartan-hydrochlorothiazide (DIOVAN-HCT) 160-25 MG per tablet Take 1 tablet by mouth daily.   Yes Historical Provider, MD  vitamin C (ASCORBIC ACID) 500 MG tablet Take 500 mg by mouth daily.     Yes Historical Provider, MD   BP 152/61 mmHg  Pulse 78  Temp(Src) 98.3 F (36.8 C)  Resp 18  SpO2 95% Physical Exam  Constitutional: He is oriented to person, place, and time. He appears well-developed and well-nourished. No distress.  HENT:  Head: Normocephalic and atraumatic.  Eyes: Conjunctivae are normal.  Cardiovascular: Normal rate and normal heart sounds.   No murmur heard. Pulmonary/Chest: Effort normal and breath sounds normal. No respiratory distress. He has no wheezes.  Pacemaker/ICD in left chest wall.  Abdominal: Soft. There is no tenderness.  Musculoskeletal: He exhibits no edema.  Neurological: He is alert and oriented to person, place, and time. A cranial nerve deficit (unable able to look down bilaterally.) is present.  Skin: Skin is warm. He is not diaphoretic.  Psychiatric: He has a normal mood and affect. His behavior is normal.  Nursing note and vitals reviewed.   ED Course  Procedures (including critical care time) Labs Review Labs Reviewed  CBC - Abnormal; Notable for the following:    MCV 100.2 (*)    All other components within normal limits  BASIC METABOLIC PANEL - Abnormal; Notable for the  following:    Glucose, Bld 115 (*)    All other components within normal limits  HEMOGLOBIN A1C  LIPID PANEL    Imaging Review Ct Angio Head W/cm &/or Wo Cm  06/05/2016  CLINICAL DATA:  82 year old male with visual changes. Concern for posterior stroke. Subsequent encounter. EXAM: CT ANGIOGRAPHY HEAD AND NECK TECHNIQUE: Multidetector CT imaging of the head and neck was performed using the standard protocol during bolus administration of intravenous contrast. Multiplanar CT image reconstructions and MIPs were obtained to evaluate the vascular anatomy. Carotid stenosis measurements (when applicable) are obtained utilizing NASCET criteria, using the distal internal carotid diameter as the denominator. CONTRAST:  140 cc Isovue 370. Initial 70 cc bolus unsuccessful secondary to patient's habitus and streak artifact. After discussion with Dr. Doree Albee and Dr. Ashok Cordia an additional 70 cc was injected. Patient will be hydrated. COMPARISON:  06/06/2015 and 02/27/2009 head CT. FINDINGS: CT HEAD Brain: No  intracranial hemorrhage. Prominent chronic microvascular changes without CT evidence of large acute infarct. No intracranial enhancing lesion. Global atrophy without hydrocephalus. Calvarium and skull base: No osseous abnormality. Incidentally noted small lipoma into right frontal region. Paranasal sinuses: Opacification ethmoid sinus air cells. Sphenoid sinus air cell opacification with air-fluid level which may indicate acute sinusitis. Polypoid opacification maxillary sinuses. Mucosal thickening frontal sinuses Orbits: Post lens replacement bilaterally. Post banding on the right. Mild exophthalmos. CTA NECK Aortic arch: Common origin innominate and left common carotid artery. Atherosclerotic changes aortic arch. Right carotid system: 78% diameter stenosis proximal right internal carotid artery. Calcified common carotid artery and right internal carotid artery which are ectatic extending posterior to the pharynx.  Left carotid system: Plaque left carotid bifurcation and proximal left internal carotid artery. 58% maximal diameter stenosis. Ectatic left common carotid artery and left internal carotid artery. Vertebral arteries:Mild narrowing origin of the vertebral arteries bilaterally. Skeleton: Degenerative changes throughout the cervical spine with various degrees spinal stenosis and foraminal narrowing. Other neck: No worrisome neck mass. Upper chest: Minimal pleural thickening. Post CABG. Pacemaker in place. CTA HEAD Anterior circulation: Anterior circulation without medium or large size vessel significant stenosis or occlusion. Calcification with mild narrowing cavernous segment internal carotid artery bilaterally. Fetal origin of the posterior cerebral artery bilaterally. Small caliber A1 segment right anterior cerebral artery may be congenitally hypoplastic versus atherosclerotic changes. Middle cerebral artery and A2 segment anterior cerebral artery mild moderate branch vessel narrowing and irregularity bilaterally. Posterior circulation: Distal aspect of vertebral arteries markedly narrowed bilaterally. Severe narrowing/ partial occlusion basilar artery. Poor delineation posterior inferior cerebral arteries and anterior inferior cerebral arteries with diminutive size superior cerebellar arteries. Mild narrowing proximal and mid aspect of the posterior cerebral artery bilaterally. Moderate narrowing posterior cerebral artery branches bilaterally. Venous sinuses: Patent. Anatomic variants: As above. Delayed phase: As above. IMPRESSION: CT HEAD No intracranial hemorrhage. Prominent chronic microvascular changes without CT evidence of large acute infarct. No intracranial enhancing lesion. Global atrophy without hydrocephalus. Opacification ethmoid sinus air cells. Sphenoid sinus air cell opacification with air-fluid level which may indicate acute sinusitis. Polypoid opacification maxillary sinuses. Mucosal thickening  frontal sinuses. CTA NECK 78% diameter stenosis proximal right internal carotid artery. Calcified right common carotid artery and right internal carotid artery which are ectatic extending posterior to the pharynx. Plaque left carotid bifurcation and proximal left internal carotid artery. 58% maximal diameter stenosis. Ectatic left common carotid artery and left internal carotid artery. Mild narrowing origin of the vertebral arteries bilaterally. CTA HEAD Distal aspect of vertebral arteries markedly narrowed bilaterally. Severe narrowing/ partial occlusion basilar artery. Poor delineation posterior inferior cerebral arteries and anterior inferior cerebral arteries with diminutive size superior cerebellar arteries. Posterior cerebral arteries supplied from anterior circulation. Mild narrowing proximal and mid aspect of the posterior cerebral artery bilaterally. Moderate narrowing posterior cerebral artery branches bilaterally. Anterior circulation without medium or large size vessel significant stenosis or occlusion. Calcification with mild narrowing cavernous segment internal carotid artery bilaterally. Small caliber A1 segment right anterior cerebral artery may be congenitally hypoplastic versus atherosclerotic changes. Middle cerebral artery and A2 segment anterior cerebral artery mild to moderate branch vessel narrowing and irregularity bilaterally. Electronically Signed   By: Genia Del M.D.   On: 06/05/2016 20:32   Ct Angio Neck W Or Wo Contrast  06/05/2016  CLINICAL DATA:  82 year old male with visual changes. Concern for posterior stroke. Subsequent encounter. EXAM: CT ANGIOGRAPHY HEAD AND NECK TECHNIQUE: Multidetector CT imaging of the head and neck was performed  using the standard protocol during bolus administration of intravenous contrast. Multiplanar CT image reconstructions and MIPs were obtained to evaluate the vascular anatomy. Carotid stenosis measurements (when applicable) are obtained  utilizing NASCET criteria, using the distal internal carotid diameter as the denominator. CONTRAST:  140 cc Isovue 370. Initial 70 cc bolus unsuccessful secondary to patient's habitus and streak artifact. After discussion with Dr. Doree Albee and Dr. Ashok Cordia an additional 70 cc was injected. Patient will be hydrated. COMPARISON:  06/06/2015 and 02/27/2009 head CT. FINDINGS: CT HEAD Brain: No intracranial hemorrhage. Prominent chronic microvascular changes without CT evidence of large acute infarct. No intracranial enhancing lesion. Global atrophy without hydrocephalus. Calvarium and skull base: No osseous abnormality. Incidentally noted small lipoma into right frontal region. Paranasal sinuses: Opacification ethmoid sinus air cells. Sphenoid sinus air cell opacification with air-fluid level which may indicate acute sinusitis. Polypoid opacification maxillary sinuses. Mucosal thickening frontal sinuses Orbits: Post lens replacement bilaterally. Post banding on the right. Mild exophthalmos. CTA NECK Aortic arch: Common origin innominate and left common carotid artery. Atherosclerotic changes aortic arch. Right carotid system: 78% diameter stenosis proximal right internal carotid artery. Calcified common carotid artery and right internal carotid artery which are ectatic extending posterior to the pharynx. Left carotid system: Plaque left carotid bifurcation and proximal left internal carotid artery. 58% maximal diameter stenosis. Ectatic left common carotid artery and left internal carotid artery. Vertebral arteries:Mild narrowing origin of the vertebral arteries bilaterally. Skeleton: Degenerative changes throughout the cervical spine with various degrees spinal stenosis and foraminal narrowing. Other neck: No worrisome neck mass. Upper chest: Minimal pleural thickening. Post CABG. Pacemaker in place. CTA HEAD Anterior circulation: Anterior circulation without medium or large size vessel significant stenosis or occlusion.  Calcification with mild narrowing cavernous segment internal carotid artery bilaterally. Fetal origin of the posterior cerebral artery bilaterally. Small caliber A1 segment right anterior cerebral artery may be congenitally hypoplastic versus atherosclerotic changes. Middle cerebral artery and A2 segment anterior cerebral artery mild moderate branch vessel narrowing and irregularity bilaterally. Posterior circulation: Distal aspect of vertebral arteries markedly narrowed bilaterally. Severe narrowing/ partial occlusion basilar artery. Poor delineation posterior inferior cerebral arteries and anterior inferior cerebral arteries with diminutive size superior cerebellar arteries. Mild narrowing proximal and mid aspect of the posterior cerebral artery bilaterally. Moderate narrowing posterior cerebral artery branches bilaterally. Venous sinuses: Patent. Anatomic variants: As above. Delayed phase: As above. IMPRESSION: CT HEAD No intracranial hemorrhage. Prominent chronic microvascular changes without CT evidence of large acute infarct. No intracranial enhancing lesion. Global atrophy without hydrocephalus. Opacification ethmoid sinus air cells. Sphenoid sinus air cell opacification with air-fluid level which may indicate acute sinusitis. Polypoid opacification maxillary sinuses. Mucosal thickening frontal sinuses. CTA NECK 78% diameter stenosis proximal right internal carotid artery. Calcified right common carotid artery and right internal carotid artery which are ectatic extending posterior to the pharynx. Plaque left carotid bifurcation and proximal left internal carotid artery. 58% maximal diameter stenosis. Ectatic left common carotid artery and left internal carotid artery. Mild narrowing origin of the vertebral arteries bilaterally. CTA HEAD Distal aspect of vertebral arteries markedly narrowed bilaterally. Severe narrowing/ partial occlusion basilar artery. Poor delineation posterior inferior cerebral arteries  and anterior inferior cerebral arteries with diminutive size superior cerebellar arteries. Posterior cerebral arteries supplied from anterior circulation. Mild narrowing proximal and mid aspect of the posterior cerebral artery bilaterally. Moderate narrowing posterior cerebral artery branches bilaterally. Anterior circulation without medium or large size vessel significant stenosis or occlusion. Calcification with mild narrowing cavernous segment internal carotid artery  bilaterally. Small caliber A1 segment right anterior cerebral artery may be congenitally hypoplastic versus atherosclerotic changes. Middle cerebral artery and A2 segment anterior cerebral artery mild to moderate branch vessel narrowing and irregularity bilaterally. Electronically Signed   By: Genia Del M.D.   On: 06/05/2016 20:32   I have personally reviewed and evaluated these images and lab results as part of my medical decision-making.   EKG Interpretation None      MDM   Final diagnoses:  Oculomotor nerve palsy, bilateral   Patient presents for oculomotor nerve palsy with inability to look down. He was seen in the emergency department by ophthalmology who did not find an intraocular source of his vision changes and recommended further stroke workup. Exam demonstrates an inability to look down with no further cranial nerve abnormalities. He received CTA of the head and neck and was admitted to the medicine service for further workup and evaluation of possible posterior stroke like symptoms.   Allie Bossier, MD 06/06/16 0127  Lajean Saver, MD 06/07/16 [phone]

## 2016-06-05 NOTE — ED Provider Notes (Signed)
MSE was initiated and I personally evaluated the patient and placed orders (if any) at  2:44 PM on June 05, 2016.  The patient appears stable so that the remainder of the MSE may be completed by another provider.  Sent from [facility] for stroke rule out. Reportedly had some ataxia and vision changes. Was going to be a medical admission but medicine requested stat MRI and ophthalmology consult. Reportedly has normal head CT. Patient however does have a pacemaker defibrillator and likely cannot get MRI. Discussed with Dr. Manuella Ghazi from Ophtho who will see the patient in the ER  Davonna Belling, MD 06/05/16 1445

## 2016-06-06 ENCOUNTER — Other Ambulatory Visit (HOSPITAL_COMMUNITY): Payer: Medicare Other

## 2016-06-06 ENCOUNTER — Encounter (HOSPITAL_COMMUNITY): Payer: Self-pay

## 2016-06-06 DIAGNOSIS — I639 Cerebral infarction, unspecified: Secondary | ICD-10-CM

## 2016-06-06 DIAGNOSIS — I6322 Cerebral infarction due to unspecified occlusion or stenosis of basilar arteries: Secondary | ICD-10-CM

## 2016-06-06 LAB — LIPID PANEL
CHOLESTEROL: 107 mg/dL (ref 0–200)
HDL: 28 mg/dL — ABNORMAL LOW (ref >40)
LDL CALC: 36 mg/dL (ref 0–99)
TRIGLYCERIDES: 214 mg/dL — AB (ref <150)
Total CHOL/HDL Ratio: 3.8 RATIO
VLDL: 43 mg/dL — ABNORMAL HIGH (ref 0–40)

## 2016-06-06 MED ORDER — CLOPIDOGREL BISULFATE 75 MG PO TABS
75.0000 mg | ORAL_TABLET | Freq: Every day | ORAL | Status: DC
  Administered 2016-06-06 – 2016-06-07 (×2): 75 mg via ORAL
  Filled 2016-06-06 (×2): qty 1

## 2016-06-06 MED ORDER — CLOPIDOGREL BISULFATE 75 MG PO TABS: 75.0000 mg | ORAL_TABLET | Freq: Every day | ORAL | Status: DC

## 2016-06-06 NOTE — Progress Notes (Signed)
  Date: 06/06/2016  Patient name: Caleb Lyons  Medical record number: [id number]  Date of birth: [birth year]   I have seen and evaluated Caleb Lyons and discussed their care with the Residency Team. In brief, patient is a 82 y/o male with PMH of complete heart block s/p ICD, ischemic CM (EF 30-35%), CAD s/p CABG, HTN, HLD, DM who p/w diplopia and inability to look down beginning 8 AM yesterday morning. Patient states he had no problems when he woke up yesterday but around 8 AM when he sat down to watch TV he was unable to suddenly look down. He was initially taken to [facility] ED and was transferred here for further w/u. He denies any focal weakness, no loss of sensation, no CP, no SOB, no abd pain,no n/v, no diaphoresis, no syncope. He states his symptoms are mildly improved today but still not resolved.   PMHx, Fam Hx, and/or Soc Hx : as per resident admit note  Filed Vitals:   06/06/16 0648 06/06/16 0814  BP:  144/49  Pulse:  71  Temp: 97.8 F (36.6 C) 97.5 F (36.4 C)  Resp:  16   Gen: AAO*3, NAD CVS: RRR, normal heart sounds Lungs: CTA b/l Abd: obese, non tender, BS + Ext: no edema Neuro: power 5/5 b/l UE. LE, sensation intact, intact lateral gaze, minimal inferior gaze and unable to move his eyes laterally once looking superiorly  Assessment and Plan: I have seen and evaluated the patient as outlined above. I agree with the formulated Assessment and Plan as detailed in the residents' note, with the following changes:   1. Likely CVA: - Patient with acute onset of inability to gaze inferiorly beginning yesterday. CTA is negative for any mass. MRI can not be done as his AICD is incompatible. He was seen by opthalmology who noted no obvious retinal tear/detatchment. Given his symptoms and acute nature of onset it seems likely he had an acute CVA. - Will get neuro f/u - He will also need PT/OT eval  - Will c/w asa, statin. Plavix added today by neuro - No further w/u for  now. Will await neuro recommendations      Aldine Contes, MD 7/17/20171:18 PM

## 2016-06-06 NOTE — Evaluation (Signed)
Physical Therapy Evaluation Patient Details Name: Caleb Lyons MRN: [id number] DOB: 1930/01/16 Today's Date: 06/06/2016   History of Present Illness  Patient is a 82 y/o male with hx of complete heart block, PAF, obesity, HTN, OSA, DM, HLD presents with vision changes. Cannot get MRI due to pacemaker. CTA head and neck-diameter stenosis proximal right ICA. Plaque left carotid bifurcation and proximal left ICA. 58% maximal diameter stenosis.Severe narrowing/ partial occlusion basilar artery  Clinical Impression  Patient presents with visual deficits and balance deficits s/p above impacting mobility. Has difficulty seeing out of lower visual fields bilaterally but quickly learning how to compensate. Discussed compensatory strategies. Informed pt that OT will further assess vision.Tolerated gait training with eye patch to diminish diplopia. Pt has support from wife at home. Will follow acutely to maximize independence and mobility prior to return home.     Follow Up Recommendations No PT follow up;Supervision for mobility/OOB    Equipment Recommendations  None recommended by PT    Recommendations for Other Services       Precautions / Restrictions Precautions Precautions: Fall Restrictions Other Position/Activity Restrictions: diplopia      Mobility  Bed Mobility Overal bed mobility: Needs Assistance Bed Mobility: Supine to Sit     Supine to sit: Min assist;HOB elevated     General bed mobility comments: Assist to elevate trunk and use of rail to get to EOB.  Transfers Overall transfer level: Needs assistance Equipment used: Rolling walker (2 wheeled) Transfers: Sit to/from Stand Sit to Stand: Min guard         General transfer comment: Min guard for safety. Stood from [employer], cues for hand placement. Transferred to chair post ambulation bout.  Ambulation/Gait Ambulation/Gait assistance: Min guard Ambulation Distance (Feet): 150 Feet Assistive device: Rolling walker  (2 wheeled) Gait Pattern/deviations: Step-through pattern;Decreased stride length   Gait velocity interpretation: <1.8 ft/sec, indicative of risk for recurrent falls General Gait Details: Slow, steady gait with pt running into obstacles in hallway x2  but able to self correct. Cues for gaze stabilization. Wearing eye patch during gait training.  Stairs            Wheelchair Mobility    Modified Rankin (Stroke Patients Only) Modified Rankin (Stroke Patients Only) Pre-Morbid Rankin Score: No symptoms Modified Rankin: Moderately severe disability     Balance Overall balance assessment: Needs assistance Sitting-balance support: Feet supported;No upper extremity supported Sitting balance-Leahy Scale: Good     Standing balance support: During functional activity Standing balance-Leahy Scale: Fair                               Pertinent Vitals/Pain Pain Assessment: No/denies pain    Home Living Family/patient expects to be discharged to:: Private residence Living Arrangements: Spouse/significant other Available Help at Discharge: Family;Available 24 hours/day Type of Home: Other(Comment) (townhome) Home Access: Stairs to enter Entrance Stairs-Rails: Right Entrance Stairs-Number of Steps: 2 Home Layout: One level Home Equipment: Walker - 2 wheels;Cane - single point      Prior Function Level of Independence: Independent with assistive device(s)         Comments: Uses [employer] for community ambulation. 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     Hand Dominance        Extremity/Trunk Assessment   Upper Extremity Assessment: Defer to OT evaluation           Lower Extremity Assessment: Overall WFL for tasks assessed  Communication   Communication: No difficulties  Cognition Arousal/Alertness: Awake/alert Behavior During Therapy: WFL for tasks assessed/performed Overall Cognitive Status: Within Functional Limits for tasks assessed                       General Comments General comments (skin integrity, edema, etc.): Able to track in all quadrants but loses vision in lower quadrants and upper quadrants with disconjugate gaze when looking vertically. Pt with no peripheral vision in lower quadrants. Discussed compensatory strategies. OT to further assess vision.    Exercises        Assessment/Plan    PT Assessment Patient needs continued PT services  PT Diagnosis Difficulty walking   PT Problem List Decreased balance;Decreased mobility;Decreased activity tolerance  PT Treatment Interventions Balance training;Gait training;Stair training;Functional mobility training;Therapeutic activities;Therapeutic exercise;Patient/family education   PT Goals (Current goals can be found in the Care Plan section) Acute Rehab PT Goals Patient Stated Goal: to go home PT Goal Formulation: With patient Time For Goal Achievement: 06/20/16 Potential to Achieve Goals: Fair    Frequency Min 3X/week   Barriers to discharge        Co-evaluation               End of Session Equipment Utilized During Treatment: Gait belt Activity Tolerance: Patient tolerated treatment well Patient left: in chair;with call bell/phone within reach;with chair alarm set;with family/visitor present Nurse Communication: Mobility status         Time: 1205-1233 PT Time Calculation (min) (ACUTE ONLY): 28 min   Charges:   PT Evaluation $PT Eval Moderate Complexity: 1 Procedure PT Treatments $Gait Training: 8-22 mins   PT G Codes:        Kameshia Madruga A Elfrieda Espino 06/06/2016, 1:16 PM Wray Kearns, [place], DPT [phone]

## 2016-06-06 NOTE — Progress Notes (Signed)
Subjective: Mr. Wolz EOM are somewhat improved today, able to depress gaze approx 15-30 degrees compared to 0 yesterday. He is also now able to elevate gaze about 45 degrees compared to 0, but develops disconjugate gaze when he attempts this. His CTA head and neck overnight was negative for mass, lesion, bleed, or abnormality other than bilateral carotid stenosis, bilateral vertebral artery narrowing, and severe basilar artery narrowing/near occlusion. Mr. Rudy acute onset neuro deficit that occurred yesterday while watching TV is most compatible with an acute ischemic stroke of a midbrain structure, although his non-MRI-compatible ICD prevents [place] from confirming the diagnosis radiologically. He will see neuro and PT/OT today.  Objective: Vital signs in last 24 hours: Filed Vitals:   06/06/16 0403 06/06/16 0603 06/06/16 0648 06/06/16 0814  BP: 147/57 151/62  144/49  Pulse: 65 72  71  Temp:   97.8 F (36.6 C) 97.5 F (36.4 C)  TempSrc:   Oral   Resp:    16  SpO2: 94% 98%  97%    Intake/Output Summary (Last 24 hours) at 06/06/16 1002 Last data filed at 06/06/16 0904  Gross per 24 hour  Intake    350 ml  Output   1100 ml  Net   -750 ml   Physical Exam HENT: Normocephalic, atraumatic, no audible carotid bruit Cardiovascular: Regular rate and rhythm, ICD in place Respiratory: Clear to auscultation bilaterally, normal work of breathing Abdomen: Obese, soft, non-tender, non-distended Skin: Warm, moist, intact Neuro: Alert and oriented, impaired EOM (minimal depression and some elevation but with disconjugate gaze/diplopia), visual acuity intact, cranial nerves otherwise intact, strength and sensation grossly intact throughout Psych: Positive affect  Labs / Imaging / Procedures: CBC Latest Ref Rng 06/05/2016 01/21/2010 12/15/2009  WBC 4.0 - 10.5 K/uL 7.8 8.4 8.0  Hemoglobin 13.0 - 17.0 g/dL 15.0 14.7 15.1  Hematocrit 39.0 - 52.0 % 44.4 42.8 45.9  Platelets 150 - 400 K/uL 181 192  214.0   BMP Latest Ref Rng 06/05/2016 04/09/2014 01/01/2014  Glucose 65 - 99 mg/dL 115(H) - -  BUN 6 - 20 mg/dL 17 - -  Creatinine 0.61 - 1.24 mg/dL 1.03 - -  Sodium 135 - 145 mmol/L 139 - -  Potassium 3.5 - 5.1 mmol/L 3.9 - -  Chloride 101 - 111 mmol/L 103 - -  CO2 22 - 32 mmol/L 28 - -  Calcium 8.9 - 10.3 mg/dL 9.9 9.9 11.2(H)   Lipid Panel     Component Value Date/Time   CHOL 107 06/06/2016 0444   TRIG 214* 06/06/2016 0444   HDL 28* 06/06/2016 0444   CHOLHDL 3.8 06/06/2016 0444   VLDL 43* 06/06/2016 0444   LDLCALC 36 06/06/2016 0444   HbA1c in process  Ct Angio Head and Neck W/cm &/or Wo Cm  06/05/2016  CLINICAL DATA:  82 year old male with visual changes. Concern for posterior stroke. Subsequent encounter. EXAM: CT ANGIOGRAPHY HEAD AND NECK TECHNIQUE: Multidetector CT imaging of the head and neck was performed using the standard protocol during bolus administration of intravenous contrast. Multiplanar CT image reconstructions and MIPs were obtained to evaluate the vascular anatomy. Carotid stenosis measurements (when applicable) are obtained utilizing NASCET criteria, using the distal internal carotid diameter as the denominator. CONTRAST:  140 cc Isovue 370. Initial 70 cc bolus unsuccessful secondary to patient's habitus and streak artifact. After discussion with Dr. Doree Albee and Dr. Ashok Cordia an additional 70 cc was injected. Patient will be hydrated. COMPARISON:  06/06/2015 and 02/27/2009 head CT. FINDINGS: CT HEAD Brain:  No intracranial hemorrhage. Prominent chronic microvascular changes without CT evidence of large acute infarct. No intracranial enhancing lesion. Global atrophy without hydrocephalus. Calvarium and skull base: No osseous abnormality. Incidentally noted small lipoma into right frontal region. Paranasal sinuses: Opacification ethmoid sinus air cells. Sphenoid sinus air cell opacification with air-fluid level which may indicate acute sinusitis. Polypoid opacification  maxillary sinuses. Mucosal thickening frontal sinuses Orbits: Post lens replacement bilaterally. Post banding on the right. Mild exophthalmos. CTA NECK Aortic arch: Common origin innominate and left common carotid artery. Atherosclerotic changes aortic arch. Right carotid system: 78% diameter stenosis proximal right internal carotid artery. Calcified common carotid artery and right internal carotid artery which are ectatic extending posterior to the pharynx. Left carotid system: Plaque left carotid bifurcation and proximal left internal carotid artery. 58% maximal diameter stenosis. Ectatic left common carotid artery and left internal carotid artery. Vertebral arteries:Mild narrowing origin of the vertebral arteries bilaterally. Skeleton: Degenerative changes throughout the cervical spine with various degrees spinal stenosis and foraminal narrowing. Other neck: No worrisome neck mass. Upper chest: Minimal pleural thickening. Post CABG. Pacemaker in place. CTA HEAD Anterior circulation: Anterior circulation without medium or large size vessel significant stenosis or occlusion. Calcification with mild narrowing cavernous segment internal carotid artery bilaterally. Fetal origin of the posterior cerebral artery bilaterally. Small caliber A1 segment right anterior cerebral artery may be congenitally hypoplastic versus atherosclerotic changes. Middle cerebral artery and A2 segment anterior cerebral artery mild moderate branch vessel narrowing and irregularity bilaterally. Posterior circulation: Distal aspect of vertebral arteries markedly narrowed bilaterally. Severe narrowing/ partial occlusion basilar artery. Poor delineation posterior inferior cerebral arteries and anterior inferior cerebral arteries with diminutive size superior cerebellar arteries. Mild narrowing proximal and mid aspect of the posterior cerebral artery bilaterally. Moderate narrowing posterior cerebral artery branches bilaterally. Venous sinuses:  Patent. Anatomic variants: As above. Delayed phase: As above. IMPRESSION: CT HEAD No intracranial hemorrhage. Prominent chronic microvascular changes without CT evidence of large acute infarct. No intracranial enhancing lesion. Global atrophy without hydrocephalus. Opacification ethmoid sinus air cells. Sphenoid sinus air cell opacification with air-fluid level which may indicate acute sinusitis. Polypoid opacification maxillary sinuses. Mucosal thickening frontal sinuses. CTA NECK 78% diameter stenosis proximal right internal carotid artery. Calcified right common carotid artery and right internal carotid artery which are ectatic extending posterior to the pharynx. Plaque left carotid bifurcation and proximal left internal carotid artery. 58% maximal diameter stenosis. Ectatic left common carotid artery and left internal carotid artery. Mild narrowing origin of the vertebral arteries bilaterally. CTA HEAD Distal aspect of vertebral arteries markedly narrowed bilaterally. Severe narrowing/ partial occlusion basilar artery. Poor delineation posterior inferior cerebral arteries and anterior inferior cerebral arteries with diminutive size superior cerebellar arteries. Posterior cerebral arteries supplied from anterior circulation. Mild narrowing proximal and mid aspect of the posterior cerebral artery bilaterally. Moderate narrowing posterior cerebral artery branches bilaterally. Anterior circulation without medium or large size vessel significant stenosis or occlusion. Calcification with mild narrowing cavernous segment internal carotid artery bilaterally. Small caliber A1 segment right anterior cerebral artery may be congenitally hypoplastic versus atherosclerotic changes. Middle cerebral artery and A2 segment anterior cerebral artery mild to moderate branch vessel narrowing and irregularity bilaterally. Electronically Signed   By: Genia Del M.D.   On: 06/05/2016 20:32   Assessment/Plan: Mr. Einstein is an 82  year old gentleman with extensive PMH CAD s/p CABG, ICD placement for complete heart block, morbid obesity, HTN, hyperparathyroidism, OSA, T2DM, HLD who presents with acute onset neurologic deficit most concerning for ischemic  stroke.  1. Suspected stoke, outside TPA window, transferred from [facility] for MRI but non-compatible ICD - CTA head and neck negative for mass, bleed, other gross intracranial abnormality, stenosis of carotids, narrowing of vertebrals and severe narrowing of basilar artery - Stroke risk factor modification, continue statin, on Aspirin, consider Plavix (?), HbA1c pending - PT/OT eval and treat, may require SNF - Neuro recs -   Continue home meds for chronic medical conditions  Dispo: Anticipated discharge in approximately 1-2 day(s).   LOS: 1 day   Asencion Partridge, MD 06/06/2016, 10:02 AM Pager: [phone]

## 2016-06-06 NOTE — Consult Note (Signed)
Requesting Physician: Dr. Eppie Gibson    Chief Complaint: bilateral opthlmoplegia  History obtained from:  Patient     HPI:                                                                                                                                         Caleb Lyons is an 82 y.o. male who has defibrillator implant and is unable to have MRI. HE states he awoke yesterday at 0700 and felt fine. He sat down to watch TV at 0800 and noted he could not see the very bottom of the TV. IF he scanned his vision he was able to see the whole TV. He also noted he needed his wife's assistance to walk to church as he could not look down to see the ground unless he scanned with his head. Patient was seen by ophthalmologist who recommended he come to hospital for possible VBI.   CTA head and neck showed 78% diameter stenosis proximal right internal carotid artery.  Plaque left carotid bifurcation and proximal left internal carotid artery. 58% maximal diameter stenosis.  Severe narrowing/ partial occlusion basilar artery.   Date last known well: 7.16.2017 Time last known well: Time: 08:00 tPA Given: No: out of window   Past Medical History  Diagnosis Date  . Complete heart block ([place])   . Ischemic cardiomyopathy     s/pCABG, Graft patent 3/11; EF 30-35%  . PAF (paroxysmal atrial fibrillation) ([place])   . Obesity        . Sinus node dysfunction ([facility])   . HTN (hypertension)   . Impotence of organic origin   . Hyperparathyroidism   . Dual implantable cardiac defibrillator in situ     [place]  . OSA (obstructive sleep apnea)   . History of chicken pox   . Type II or unspecified type diabetes mellitus without mention of complication, not stated as uncontrolled   . HLD (hyperlipidemia)     Past Surgical History  Procedure Laterality Date  . Coronary artery bypass graft    . Tonsillectomy      Family History  Problem Relation Age of Onset  . Early death Father   . Stroke Father    Social  History:  reports that he has never smoked. He has never used smokeless tobacco. He reports that he drinks alcohol. He reports that he does not use illicit drugs.  Allergies: No Known Allergies  Medications:  Scheduled: . allopurinol  200 mg Oral Daily  . aspirin EC  81 mg Oral Daily  . atorvastatin  40 mg Oral Daily  . cinacalcet  30 mg Oral Q breakfast  . dutasteride  0.5 mg Oral Daily   And  . tamsulosin  0.4 mg Oral Daily  . enoxaparin (LOVENOX) injection  40 mg Subcutaneous Q24H  . levothyroxine  175 mcg Oral QAC breakfast    ROS:                                                                                                                                       History obtained from the patient  General ROS: negative for - chills, fatigue, fever, night sweats, weight gain or weight loss Psychological ROS: negative for - behavioral disorder, hallucinations, memory difficulties, mood swings or suicidal ideation Ophthalmic ROS: negative for - blurry vision, double vision, eye pain or loss of vision ENT ROS: negative for - epistaxis, nasal discharge, oral lesions, sore throat, tinnitus or vertigo Allergy and Immunology ROS: negative for - hives or itchy/watery eyes Hematological and Lymphatic ROS: negative for - bleeding problems, bruising or swollen lymph nodes Endocrine ROS: negative for - galactorrhea, hair pattern changes, polydipsia/polyuria or temperature intolerance Respiratory ROS: negative for - cough, hemoptysis, shortness of breath or wheezing Cardiovascular ROS: negative for - chest pain, dyspnea on exertion, edema or irregular heartbeat Gastrointestinal ROS: negative for - abdominal pain, diarrhea, hematemesis, nausea/vomiting or stool incontinence Genito-Urinary ROS: negative for - dysuria, hematuria, incontinence or urinary  frequency/urgency Musculoskeletal ROS: negative for - joint swelling or muscular weakness Neurological ROS: as noted in HPI Dermatological ROS: negative for rash and skin lesion changes  Neurologic Examination:                                                                                                      Blood pressure 144/49, pulse 71, temperature 97.5 F (36.4 C), temperature source Oral, resp. rate 16, SpO2 97 %.  HEENT-  Normocephalic, no lesions, without obvious abnormality.  Normal external eye and conjunctiva.  Normal TM's bilaterally.  Normal auditory canals and external ears. Normal external nose, mucus membranes and septum.  Normal pharynx. Cardiovascular- S1, S2 normal, pulses palpable throughout   Lungs- chest clear, no wheezing, rales, normal symmetric air entry Abdomen- normal findings: bowel sounds normal Extremities- no edema Lymph-no adenopathy palpable Musculoskeletal-no joint tenderness, deformity or swelling Skin-warm and dry, no hyperpigmentation, vitiligo, or suspicious lesions  Neurological Examination Mental Status:  Alert, oriented, thought content appropriate.  Speech fluent without evidence of aphasia.  Able to follow 3 step commands without difficulty. Cranial Nerves: II:  Visual fields grossly normal, pupils equal, round, reactive to light and accommodation III,IV, VI: ptosis not present, extra-ocular motions shows intact lateral gazes, no inferior gaze and when looking superiorly his eyes will go up once and then he is unable to get his eyes to vertically deviate. Vertical diplopia when attempting to look up and down.  V,VII: smile symmetric, facial light touch sensation normal bilaterally VIII: hearing normal bilaterally IX,X: uvula rises symmetrically XI: bilateral shoulder shrug XII: midline tongue extension Motor: Right : Upper extremity   5/5    Left:     Upper extremity   5/5  Lower extremity   5/5     Lower extremity   5/5 Tone and bulk:normal  tone throughout; no atrophy noted Sensory: Pinprick and light touch intact throughout, bilaterally Deep Tendon Reflexes: 1+ and symmetric throughout UE right knee in brace and left knee has 2+ DTR. No AJ bilaterally Plantars: Right: downgoing   Left: downgoing Cerebellar: normal finger-to-nose,  Gait: not tested       Lab Results: Basic Metabolic Panel:  Recent Labs Lab 06/05/16 1712  NA 139  K 3.9  CL 103  CO2 28  GLUCOSE 115*  BUN 17  CREATININE 1.03  CALCIUM 9.9    Liver Function Tests: No results for input(s): AST, ALT, ALKPHOS, BILITOT, PROT, ALBUMIN in the last 168 hours. No results for input(s): LIPASE, AMYLASE in the last 168 hours. No results for input(s): AMMONIA in the last 168 hours.  CBC:  Recent Labs Lab 06/05/16 1712  WBC 7.8  HGB 15.0  HCT 44.4  MCV 100.2*  PLT 181    Cardiac Enzymes: No results for input(s): CKTOTAL, CKMB, CKMBINDEX, TROPONINI in the last 168 hours.  Lipid Panel:  Recent Labs Lab 06/06/16 0444  CHOL 107  TRIG 214*  HDL 28*  CHOLHDL 3.8  VLDL 43*  LDLCALC 36    CBG: No results for input(s): GLUCAP in the last 168 hours.  Microbiology: Results for orders placed or performed during the hospital encounter of 01/20/10  MRSA PCR Screening     Status: None   Collection Time: 01/20/10 11:56 PM  Result Value Ref Range Status   MRSA by PCR  NEGATIVE Final    NEGATIVE        The GeneXpert MRSA Assay (FDA approved for NASAL specimens only), is one component of a comprehensive MRSA colonization surveillance program. It is not intended to diagnose MRSA infection nor to guide or monitor treatment for MRSA infections.    Coagulation Studies: No results for input(s): LABPROT, INR in the last 72 hours.  Imaging: Ct Angio Head W/cm &/or Wo Cm  06/05/2016  CLINICAL DATA:  82 year old male with visual changes. Concern for posterior stroke. Subsequent encounter. EXAM: CT ANGIOGRAPHY HEAD AND NECK TECHNIQUE:  Multidetector CT imaging of the head and neck was performed using the standard protocol during bolus administration of intravenous contrast. Multiplanar CT image reconstructions and MIPs were obtained to evaluate the vascular anatomy. Carotid stenosis measurements (when applicable) are obtained utilizing NASCET criteria, using the distal internal carotid diameter as the denominator. CONTRAST:  140 cc Isovue 370. Initial 70 cc bolus unsuccessful secondary to patient's habitus and streak artifact. After discussion with Dr. Doree Albee and Dr. Ashok Cordia an additional 70 cc was injected. Patient will be hydrated. COMPARISON:  06/06/2015 and 02/27/2009 head CT. FINDINGS:  CT HEAD Brain: No intracranial hemorrhage. Prominent chronic microvascular changes without CT evidence of large acute infarct. No intracranial enhancing lesion. Global atrophy without hydrocephalus. Calvarium and skull base: No osseous abnormality. Incidentally noted small lipoma into right frontal region. Paranasal sinuses: Opacification ethmoid sinus air cells. Sphenoid sinus air cell opacification with air-fluid level which may indicate acute sinusitis. Polypoid opacification maxillary sinuses. Mucosal thickening frontal sinuses Orbits: Post lens replacement bilaterally. Post banding on the right. Mild exophthalmos. CTA NECK Aortic arch: Common origin innominate and left common carotid artery. Atherosclerotic changes aortic arch. Right carotid system: 78% diameter stenosis proximal right internal carotid artery. Calcified common carotid artery and right internal carotid artery which are ectatic extending posterior to the pharynx. Left carotid system: Plaque left carotid bifurcation and proximal left internal carotid artery. 58% maximal diameter stenosis. Ectatic left common carotid artery and left internal carotid artery. Vertebral arteries:Mild narrowing origin of the vertebral arteries bilaterally. Skeleton: Degenerative changes throughout the cervical  spine with various degrees spinal stenosis and foraminal narrowing. Other neck: No worrisome neck mass. Upper chest: Minimal pleural thickening. Post CABG. Pacemaker in place. CTA HEAD Anterior circulation: Anterior circulation without medium or large size vessel significant stenosis or occlusion. Calcification with mild narrowing cavernous segment internal carotid artery bilaterally. Fetal origin of the posterior cerebral artery bilaterally. Small caliber A1 segment right anterior cerebral artery may be congenitally hypoplastic versus atherosclerotic changes. Middle cerebral artery and A2 segment anterior cerebral artery mild moderate branch vessel narrowing and irregularity bilaterally. Posterior circulation: Distal aspect of vertebral arteries markedly narrowed bilaterally. Severe narrowing/ partial occlusion basilar artery. Poor delineation posterior inferior cerebral arteries and anterior inferior cerebral arteries with diminutive size superior cerebellar arteries. Mild narrowing proximal and mid aspect of the posterior cerebral artery bilaterally. Moderate narrowing posterior cerebral artery branches bilaterally. Venous sinuses: Patent. Anatomic variants: As above. Delayed phase: As above. IMPRESSION: CT HEAD No intracranial hemorrhage. Prominent chronic microvascular changes without CT evidence of large acute infarct. No intracranial enhancing lesion. Global atrophy without hydrocephalus. Opacification ethmoid sinus air cells. Sphenoid sinus air cell opacification with air-fluid level which may indicate acute sinusitis. Polypoid opacification maxillary sinuses. Mucosal thickening frontal sinuses. CTA NECK 78% diameter stenosis proximal right internal carotid artery. Calcified right common carotid artery and right internal carotid artery which are ectatic extending posterior to the pharynx. Plaque left carotid bifurcation and proximal left internal carotid artery. 58% maximal diameter stenosis. Ectatic left  common carotid artery and left internal carotid artery. Mild narrowing origin of the vertebral arteries bilaterally. CTA HEAD Distal aspect of vertebral arteries markedly narrowed bilaterally. Severe narrowing/ partial occlusion basilar artery. Poor delineation posterior inferior cerebral arteries and anterior inferior cerebral arteries with diminutive size superior cerebellar arteries. Posterior cerebral arteries supplied from anterior circulation. Mild narrowing proximal and mid aspect of the posterior cerebral artery bilaterally. Moderate narrowing posterior cerebral artery branches bilaterally. Anterior circulation without medium or large size vessel significant stenosis or occlusion. Calcification with mild narrowing cavernous segment internal carotid artery bilaterally. Small caliber A1 segment right anterior cerebral artery may be congenitally hypoplastic versus atherosclerotic changes. Middle cerebral artery and A2 segment anterior cerebral artery mild to moderate branch vessel narrowing and irregularity bilaterally. Electronically Signed   By: Genia Del M.D.   On: 06/05/2016 20:32   Ct Angio Neck W Or Wo Contrast  06/05/2016  CLINICAL DATA:  82 year old male with visual changes. Concern for posterior stroke. Subsequent encounter. EXAM: CT ANGIOGRAPHY HEAD AND NECK TECHNIQUE: Multidetector CT imaging of the head  and neck was performed using the standard protocol during bolus administration of intravenous contrast. Multiplanar CT image reconstructions and MIPs were obtained to evaluate the vascular anatomy. Carotid stenosis measurements (when applicable) are obtained utilizing NASCET criteria, using the distal internal carotid diameter as the denominator. CONTRAST:  140 cc Isovue 370. Initial 70 cc bolus unsuccessful secondary to patient's habitus and streak artifact. After discussion with Dr. Doree Albee and Dr. Ashok Cordia an additional 70 cc was injected. Patient will be hydrated. COMPARISON:  06/06/2015  and 02/27/2009 head CT. FINDINGS: CT HEAD Brain: No intracranial hemorrhage. Prominent chronic microvascular changes without CT evidence of large acute infarct. No intracranial enhancing lesion. Global atrophy without hydrocephalus. Calvarium and skull base: No osseous abnormality. Incidentally noted small lipoma into right frontal region. Paranasal sinuses: Opacification ethmoid sinus air cells. Sphenoid sinus air cell opacification with air-fluid level which may indicate acute sinusitis. Polypoid opacification maxillary sinuses. Mucosal thickening frontal sinuses Orbits: Post lens replacement bilaterally. Post banding on the right. Mild exophthalmos. CTA NECK Aortic arch: Common origin innominate and left common carotid artery. Atherosclerotic changes aortic arch. Right carotid system: 78% diameter stenosis proximal right internal carotid artery. Calcified common carotid artery and right internal carotid artery which are ectatic extending posterior to the pharynx. Left carotid system: Plaque left carotid bifurcation and proximal left internal carotid artery. 58% maximal diameter stenosis. Ectatic left common carotid artery and left internal carotid artery. Vertebral arteries:Mild narrowing origin of the vertebral arteries bilaterally. Skeleton: Degenerative changes throughout the cervical spine with various degrees spinal stenosis and foraminal narrowing. Other neck: No worrisome neck mass. Upper chest: Minimal pleural thickening. Post CABG. Pacemaker in place. CTA HEAD Anterior circulation: Anterior circulation without medium or large size vessel significant stenosis or occlusion. Calcification with mild narrowing cavernous segment internal carotid artery bilaterally. Fetal origin of the posterior cerebral artery bilaterally. Small caliber A1 segment right anterior cerebral artery may be congenitally hypoplastic versus atherosclerotic changes. Middle cerebral artery and A2 segment anterior cerebral artery mild  moderate branch vessel narrowing and irregularity bilaterally. Posterior circulation: Distal aspect of vertebral arteries markedly narrowed bilaterally. Severe narrowing/ partial occlusion basilar artery. Poor delineation posterior inferior cerebral arteries and anterior inferior cerebral arteries with diminutive size superior cerebellar arteries. Mild narrowing proximal and mid aspect of the posterior cerebral artery bilaterally. Moderate narrowing posterior cerebral artery branches bilaterally. Venous sinuses: Patent. Anatomic variants: As above. Delayed phase: As above. IMPRESSION: CT HEAD No intracranial hemorrhage. Prominent chronic microvascular changes without CT evidence of large acute infarct. No intracranial enhancing lesion. Global atrophy without hydrocephalus. Opacification ethmoid sinus air cells. Sphenoid sinus air cell opacification with air-fluid level which may indicate acute sinusitis. Polypoid opacification maxillary sinuses. Mucosal thickening frontal sinuses. CTA NECK 78% diameter stenosis proximal right internal carotid artery. Calcified right common carotid artery and right internal carotid artery which are ectatic extending posterior to the pharynx. Plaque left carotid bifurcation and proximal left internal carotid artery. 58% maximal diameter stenosis. Ectatic left common carotid artery and left internal carotid artery. Mild narrowing origin of the vertebral arteries bilaterally. CTA HEAD Distal aspect of vertebral arteries markedly narrowed bilaterally. Severe narrowing/ partial occlusion basilar artery. Poor delineation posterior inferior cerebral arteries and anterior inferior cerebral arteries with diminutive size superior cerebellar arteries. Posterior cerebral arteries supplied from anterior circulation. Mild narrowing proximal and mid aspect of the posterior cerebral artery bilaterally. Moderate narrowing posterior cerebral artery branches bilaterally. Anterior circulation without  medium or large size vessel significant stenosis or occlusion. Calcification with mild narrowing cavernous  segment internal carotid artery bilaterally. Small caliber A1 segment right anterior cerebral artery may be congenitally hypoplastic versus atherosclerotic changes. Middle cerebral artery and A2 segment anterior cerebral artery mild to moderate branch vessel narrowing and irregularity bilaterally. Electronically Signed   By: Genia Del M.D.   On: 06/05/2016 20:32       Assessment and plan discussed with with attending physician and they are in agreement.    Etta Quill PA-C [facility] [phone]  06/06/2016, 10:54 AM  LDL 36  Mr. Hovan has both vertical and downgaze palsies as well as decreased abduction on the right.    Assessment: 82 y.o. male with acute onset occulomotor palsies which, given the dismal posterior circulation and acuity of onset, I Suspect are related to small ischemic brainstem stroke. He appears stable compared to his initial complaints, but given the severe stenosis of the basilar, I would favor dual antiplatelet therapy at this time.   Stroke Risk Factors - atrial fibrillation, hyperlipidemia and hypertension  1. HgbA1c 2. Frequent neuro checks 3. Prophylactic therapy-Antiplatelet med: Aspirin 81mg  + plavix 75mg  daily.  4. Risk factor modification 5. Telemetry monitoring 6. PT consult, OT consult, Speech consult 7. please page stroke NP  Or  PA  Or MD  M-F from 8am -4 pm starting 7/18 as this patient will be followed by the stroke team at this point.   You can look them up on [email or web address]    Roland Rack, MD [facility] [phone]  If 7pm- 7am, please page neurology on call as listed in [place].

## 2016-06-06 NOTE — Evaluation (Signed)
Occupational Therapy Evaluation Patient Details Name: Caleb Lyons MRN: [id number] DOB: [birth year] Today's Date: 06/06/2016    History of Present Illness Patient is a 82 y/o male with hx of complete heart block, PAF, obesity, HTN, OSA, DM, HLD presents with vision changes. Cannot get MRI due to pacemaker. CTA head and neck-diameter stenosis proximal right ICA. Plaque left carotid bifurcation and proximal left ICA. 58% maximal diameter stenosis.Severe narrowing/ partial occlusion basilar artery   Clinical Impression   Pt admitted with above. He demonstrates the below listed deficits and will benefit from continued OT to maximize safety and independence with BADLs.  Pt presents to OT with occulomotor deficits and is suspicious for small field deficits Lt and Rt (consistently loses pen when tracking in same locations), but difficult to detect with confrontation testing.  He requires min guard assist to min A for ADLs.  Recommend follow OPOT at the neuro rehab center (needs OT familiar with neuro visual deficits, and follow up with ophthalmology and [facility] [address] field assessment.  Recommend NO driving.  Will follow acutely.    .      Follow Up Recommendations  Outpatient OT;Supervision - Intermittent    Equipment Recommendations  None recommended by OT    Recommendations for Other Services       Precautions / Restrictions Precautions Precautions: Fall Restrictions Other Position/Activity Restrictions: diplopia      Mobility Bed Mobility         Supine to sit: Min assist;HOB elevated        Transfers                      Balance                                            ADL                                         General ADL Comments: Pt is able to perform ADLs with min guard to min A      Vision Vision Assessment?: Yes Eye Alignment: Impaired (comment) Ocular Range of Motion: Restricted looking up;Restricted  looking down Tracking/Visual Pursuits: Decreased smoothness of vertical tracking;Decreased smoothness of eye movement to RIGHT superior field;Decreased smoothness of eye movement to LEFT superior field;Decreased smoothness of eye movement to RIGHT inferior field;Decreased smoothness of eye movement to LEFT inferior field Convergence: Impaired (comment) Visual Fields: Right visual field deficit;Left visual field deficit (questionable Lt deficit ) Diplopia Assessment: Disappears with one eye closed;Objects split on top of one another;Present all the time/all directions (Is able to fuse at times with Lt gaze ) Depth Perception: Undershoots;Overshoots Additional Comments: Rt superior field.  Unable to detect further field deficits with confrontation testing, however, he consistently "lost" pen when tracking it to the Lt in the central to slight inferior quadrant.  Discussed patching vs. partial occlusion.  Currently, pt does not have glasses available (wife will bring tomorrow).  Patch was ordered, with instruction for pt to alternate eyes every 2 hours.  Pt was instructed in ROM and fusion exercises.  Discussed no driving and need to follow up with ophthalmologist and neuro OT.  Also discussed safety issues with him due to loss of depth perception.   Perception  Praxis      Pertinent Vitals/Pain Pain Assessment: No/denies pain     Hand Dominance Right   Extremity/Trunk Assessment Upper Extremity Assessment Upper Extremity Assessment: Overall WFL for tasks assessed   Lower Extremity Assessment Lower Extremity Assessment: Defer to PT evaluation       Communication Communication Communication: No difficulties   Cognition Arousal/Alertness: Awake/alert Behavior During Therapy: WFL for tasks assessed/performed Overall Cognitive Status: Within Functional Limits for tasks assessed                     General Comments       Exercises       Shoulder Instructions      Home  Living Family/patient expects to be discharged to:: Private residence Living Arrangements: Spouse/significant other Available Help at Discharge: Family;Available 24 hours/day Type of Home: Other(Comment) (townhome) Home Access: Stairs to enter [employer] of Steps: 2 Entrance Stairs-Rails: Right Home Layout: One level     Bathroom Shower/Tub: Occupational psychologist: Handicapped height     Home Equipment: Environmental consultant - 2 wheels;Cane - single point;Shower seat - built in;Grab bars - toilet;Grab bars - tub/shower          Prior Functioning/Environment Level of Independence: Independent with assistive device(s)        Comments: Uses SPC for community ambulation. 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    OT Diagnosis: Disturbance of vision   OT Problem List: Impaired balance (sitting and/or standing);Obesity;Impaired vision/perception   OT Treatment/Interventions: Self-care/ADL training;Therapeutic activities;Visual/perceptual remediation/compensation;Patient/family education;Balance training    OT Goals(Current goals can be found in the care plan section) Acute Rehab OT Goals Patient Stated Goal: to go home OT Goal Formulation: With patient Time For Goal Achievement: 06/13/16 Potential to Achieve Goals: Good ADL Goals Additional ADL Goal #1: Pt will perform ADLs with supervision Additional ADL Goal #2: Pt will be independent with visual HEP   OT Frequency: Min 2X/week   Barriers to D/C:            Co-evaluation              End of Session    Activity Tolerance: Patient tolerated treatment well Patient left: in bed;with call bell/phone within reach;with family/visitor present   Time: 1409-1450 OT Time Calculation (min): 41 min Charges:  OT General Charges $OT Visit: 1 Procedure OT Evaluation $OT Eval Moderate Complexity: 1 Procedure OT Treatments $Self Care/Home Management : 23-37 mins G-Codes:    Caleb Lyons 2016/06/10, 5:22  PM

## 2016-06-06 NOTE — Care Management Note (Signed)
Case Management Note  Patient Details  Name: DETWAN WANT MRN: [id number] Date of Birth: Aug 14, 1930  Subjective/Objective:                 Independent patient admitted from home with spouse, vision changes r/o cva. Currently on ASA and Plavix. No Pt or DME rec after DC.   Action/Plan:  Anticipate DC to home with wife when medically clear.  Expected Discharge Date:                  Expected Discharge Plan:  Home/Self Care  In-House Referral:     Discharge planning Services  CM Consult  Post Acute Care Choice:  NA Choice offered to:     DME Arranged:    DME Agency:     HH Arranged:    [place] Agency:     Status of Service:  Completed, signed off  If discussed at [employer] of Stay Meetings, dates discussed:    Additional Comments:  Carles Collet, RN 06/06/2016, 4:13 PM

## 2016-06-07 ENCOUNTER — Inpatient Hospital Stay (HOSPITAL_COMMUNITY): Payer: Medicare Other

## 2016-06-07 ENCOUNTER — Other Ambulatory Visit: Payer: Self-pay | Admitting: Neurology

## 2016-06-07 ENCOUNTER — Other Ambulatory Visit: Payer: Self-pay | Admitting: Internal Medicine

## 2016-06-07 DIAGNOSIS — I6523 Occlusion and stenosis of bilateral carotid arteries: Secondary | ICD-10-CM

## 2016-06-07 DIAGNOSIS — I6302 Cerebral infarction due to thrombosis of basilar artery: Principal | ICD-10-CM

## 2016-06-07 DIAGNOSIS — I69398 Other sequelae of cerebral infarction: Secondary | ICD-10-CM

## 2016-06-07 DIAGNOSIS — I25709 Atherosclerosis of coronary artery bypass graft(s), unspecified, with unspecified angina pectoris: Secondary | ICD-10-CM

## 2016-06-07 DIAGNOSIS — I6529 Occlusion and stenosis of unspecified carotid artery: Secondary | ICD-10-CM | POA: Insufficient documentation

## 2016-06-07 DIAGNOSIS — H4903 Third [oculomotor] nerve palsy, bilateral: Secondary | ICD-10-CM

## 2016-06-07 DIAGNOSIS — I10 Essential (primary) hypertension: Secondary | ICD-10-CM

## 2016-06-07 DIAGNOSIS — E1159 Type 2 diabetes mellitus with other circulatory complications: Secondary | ICD-10-CM | POA: Insufficient documentation

## 2016-06-07 DIAGNOSIS — E785 Hyperlipidemia, unspecified: Secondary | ICD-10-CM

## 2016-06-07 DIAGNOSIS — G459 Transient cerebral ischemic attack, unspecified: Secondary | ICD-10-CM

## 2016-06-07 DIAGNOSIS — H5123 Internuclear ophthalmoplegia, bilateral: Secondary | ICD-10-CM

## 2016-06-07 DIAGNOSIS — G4733 Obstructive sleep apnea (adult) (pediatric): Secondary | ICD-10-CM

## 2016-06-07 LAB — HEMOGLOBIN A1C
HEMOGLOBIN A1C: 6.5 % — AB (ref 4.8–5.6)
MEAN PLASMA GLUCOSE: 140 mg/dL

## 2016-06-07 LAB — ECHOCARDIOGRAM COMPLETE
Height: 73 in
WEIGHTICAEL: 6903.04 [oz_av]

## 2016-06-07 MED ORDER — PERFLUTREN LIPID MICROSPHERE
1.0000 mL | INTRAVENOUS | Status: AC | PRN
  Administered 2016-06-07: 4 mL via INTRAVENOUS
  Filled 2016-06-07: qty 10

## 2016-06-07 NOTE — Progress Notes (Signed)
Physical Therapy Treatment Patient Details Name: Caleb Lyons MRN: [id number] DOB: 06/01/1930 Today's Date: 06/07/2016    History of Present Illness Patient is a 82 y/o male with hx of complete heart block, PAF, obesity, HTN, OSA, DM, HLD presents with vision changes. Cannot get MRI due to pacemaker. CTA head and neck-diameter stenosis proximal right ICA. Plaque left carotid bifurcation and proximal left ICA. 58% maximal diameter stenosis.Severe narrowing/ partial occlusion basilar artery    PT Comments    Patient progressing well towards PT goals. Reports vision improved in left eye but still reports difficulty with right eye reporting some "holes" in visual field and losing therapist in periphery. Pt better able to scan environment today to compensate for visual deficits. Discussed eye patch and safety at home. Tolerated gait training with Min guard assist for safety. DOE noted. Recommend use of RW at home for safety. Pt/wife agreeable. Will continue to follow.    Follow Up Recommendations  No PT follow up;Supervision for mobility/OOB     Equipment Recommendations  None recommended by PT    Recommendations for Other Services       Precautions / Restrictions Precautions Precautions: Fall Restrictions Other Position/Activity Restrictions: diplopia    Mobility  Bed Mobility Overal bed mobility: Needs Assistance Bed Mobility: Supine to Sit     Supine to sit: Min assist;HOB elevated     General bed mobility comments: Assist to elevate trunk and use of rail to get to EOB.  Transfers Overall transfer level: Needs assistance Equipment used: Rolling walker (2 wheeled) Transfers: Sit to/from Stand Sit to Stand: Min guard         General transfer comment: Min guard for safety with use of body momentum. Stood from [employer], transferred to chair post ambulation bout.  Ambulation/Gait Ambulation/Gait assistance: Min guard Ambulation Distance (Feet): 165 Feet Assistive  device: Rolling walker (2 wheeled) Gait Pattern/deviations: Step-through pattern;Decreased stride length   Gait velocity interpretation: <1.8 ft/sec, indicative of risk for recurrent falls General Gait Details: Slow, steady gait . Able to scan environment without cues, wearing eye patch to decrease diplopia. DOE. Had to stop and talk when answering questions.   Stairs            Wheelchair Mobility    Modified Rankin (Stroke Patients Only) Modified Rankin (Stroke Patients Only) Pre-Morbid Rankin Score: No symptoms Modified Rankin: Moderately severe disability     Balance Overall balance assessment: Needs assistance Sitting-balance support: Feet supported;No upper extremity supported Sitting balance-Leahy Scale: Good     Standing balance support: During functional activity Standing balance-Leahy Scale: Fair                      Cognition Arousal/Alertness: Awake/alert Behavior During Therapy: WFL for tasks assessed/performed Overall Cognitive Status: Within Functional Limits for tasks assessed                      Exercises      General Comments General comments (skin integrity, edema, etc.): Wife present during session. Reports able to see in upper/lower quadrants of left eye- improved from yesterday.      Pertinent Vitals/Pain Pain Assessment: No/denies pain    Home Living                      Prior Function            PT Goals (current goals can now be found in the care plan section) Progress  towards PT goals: Progressing toward goals    Frequency  Min 3X/week    PT Plan Current plan remains appropriate    Co-evaluation             End of Session Equipment Utilized During Treatment: Gait belt Activity Tolerance: Patient tolerated treatment well Patient left: in chair;with call bell/phone within reach;with chair alarm set;Other (comment) (OT in room)     Time: [id number] PT Time Calculation (min) (ACUTE ONLY): 20  min  Charges:  $Gait Training: 8-22 mins                    G Codes:      Tiarah Shisler A Erby Sanderson 06/07/2016, 12:08 PM Wray Kearns, [place], DPT [phone]

## 2016-06-07 NOTE — Progress Notes (Signed)
Echocardiogram 2D Echocardiogram has been performed with definity.  Aggie Cosier 06/07/2016, 10:34 AM

## 2016-06-07 NOTE — Progress Notes (Signed)
Pt ready for discharge. IV removed. Pt. Is alert and oriented. Pt is hemodynamically stable. AVS reviewed with pt. Capable of re verbalizing medication regimen. Discharge plan appropriate and in place.

## 2016-06-07 NOTE — Progress Notes (Signed)
STROKE TEAM PROGRESS NOTE   HISTORY OF PRESENT ILLNESS (per record) Caleb Lyons is an 82 y.o. male who has defibrillator implant and is unable to have MRI. He states he awoke yesterday at 0700 and felt fine. He sat down to watch TV at 0800 06/06/2016 (LKW) and noted he could not see the very bottom of the TV. IF he scanned his vision he was able to see the whole TV. He also noted he needed his wife's assistance to walk to church as he could not look down to see the ground unless he scanned with his head. Patient was seen by ophthalmologist who recommended he come to hospital for possible VBI.   CTA head and neck showed 78% diameter stenosis proximal right internal carotid artery. Plaque left carotid bifurcation and proximal left internal carotid artery. 58% maximal diameter stenosis. Severe narrowing/ partial occlusion basilar artery.  Patient was not administered IV t-PA secondary to delay in arrival. He was admitted for further evaluation and treatment.   SUBJECTIVE (INTERVAL HISTORY) His wife is at the bedside.  She states he has been discharged. Overall he feels his condition is stable. I discussed importance of keeping BP goal at 130-150 due to BA stenosis - not letting it goes too low.   OBJECTIVE Temp:  [97.9 F (36.6 C)-98.7 F (37.1 C)] 97.9 F (36.6 C) (07/18 0544) Pulse Rate:  [77-81] 77 (07/18 0544) Cardiac Rhythm:  [-] Normal sinus rhythm;Ventricular paced;Bundle branch block;Heart block (07/18 0824) Resp:  [16-20] 18 (07/18 0544) BP: (133-148)/(56-68) 133/64 mmHg (07/18 0544) SpO2:  [95 %-99 %] 99 % (07/18 0544) Weight:  [195.7 kg (431 lb 7 oz)] 195.7 kg (431 lb 7 oz) (07/18 0544)  CBC:  Recent Labs Lab 06/05/16 1712  WBC 7.8  HGB 15.0  HCT 44.4  MCV 100.2*  PLT 0000000    Basic Metabolic Panel:  Recent Labs Lab 06/05/16 1712  NA 139  K 3.9  CL 103  CO2 28  GLUCOSE 115*  BUN 17  CREATININE 1.03  CALCIUM 9.9    Lipid Panel:    Component Value Date/Time    CHOL 107 06/06/2016 0444   TRIG 214* 06/06/2016 0444   HDL 28* 06/06/2016 0444   CHOLHDL 3.8 06/06/2016 0444   VLDL 43* 06/06/2016 0444   LDLCALC 36 06/06/2016 0444   HgbA1c:  Lab Results  Component Value Date   HGBA1C 6.5* 06/06/2016   Urine Drug Screen: No results found for: LABOPIA, COCAINSCRNUR, LABBENZ, AMPHETMU, THCU, LABBARB    IMAGING I have personally reviewed the radiological images below and agree with the radiology interpretations.  CT HEAD  06/05/2016   No intracranial hemorrhage. Prominent chronic microvascular changes without CT evidence of large acute infarct. No intracranial enhancing lesion. Global atrophy without hydrocephalus. Opacification ethmoid sinus air cells. Sphenoid sinus air cell opacification with air-fluid level which may indicate acute sinusitis. Polypoid opacification maxillary sinuses. Mucosal thickening frontal sinuses.   CTA NECK  06/05/2016   78% diameter stenosis proximal right internal carotid artery. Calcified right common carotid artery and right internal carotid artery which are ectatic extending posterior to the pharynx. Plaque left carotid bifurcation and proximal left internal carotid artery. 58% maximal diameter stenosis. Ectatic left common carotid artery and left internal carotid artery. Mild narrowing origin of the vertebral arteries bilaterally.   CTA HEAD  06/05/2016   Distal aspect of vertebral arteries markedly narrowed bilaterally. Severe narrowing/ partial occlusion basilar artery. Poor delineation posterior inferior cerebral arteries and anterior inferior cerebral  arteries with diminutive size superior cerebellar arteries. Posterior cerebral arteries supplied from anterior circulation. Mild narrowing proximal and mid aspect of the posterior cerebral artery bilaterally. Moderate narrowing posterior cerebral artery branches bilaterally. Anterior circulation without medium or large size vessel significant stenosis or occlusion.  Calcification with mild narrowing cavernous segment internal carotid artery bilaterally. Small caliber A1 segment right anterior cerebral artery may be congenitally hypoplastic versus atherosclerotic changes. Middle cerebral artery and A2 segment anterior cerebral artery mild to moderate branch vessel narrowing and irregularity bilaterally. Electronically Signed     2D Echocardiogram  - Left ventricle: The cavity size was normal. There was mild concentric hypertrophy. Systolic function was normal. The estimated ejection fraction was in the range of 50% to 55%. There is akinesis of the distal septal and apical myocardium. There is akinesis of the apicalanterior myocardium. There was a reduced contribution of atrial contraction to ventricular filling, due to increased ventricular diastolic pressure or atrial contractile dysfunction. Features are consistent with a pseudonormal left ventricular filling pattern, with concomitant abnormal relaxation and increased filling pressure (grade 2 diastolic dysfunction). Doppler parameters are consistent with high ventricular filling pressure. - Ventricular septum: Septal motion showed paradox. These changes are consistent with right ventricular pacing. - Aortic valve: Moderate diffuse thickening and calcification, consistent with sclerosis. - Aorta: Aortic root dimension: 39 mm (ED). - Aortic root: The aortic root was mildly dilated. - Mitral valve: Calcified annulus. - Pulmonary arteries: PA peak pressure: 35 mm Hg (S). Impressions:   The right ventricular systolic pressure was increased consistent with mild pulmonary hypertension.   PHYSICAL EXAM  Temp:  [97.9 F (36.6 C)-98.7 F (37.1 C)] 98.1 F (36.7 C) (07/18 1035) Pulse Rate:  [77-90] 90 (07/18 1035) Resp:  [18-20] 18 (07/18 1035) BP: (133-159)/(64-69) 159/69 mmHg (07/18 1035) SpO2:  [93 %-99 %] 93 % (07/18 1035) Weight:  [431 lb 7 oz (195.7 kg)] 431 lb 7 oz (195.7 kg) (07/18 0544)  General - Well  nourished, well developed, in no apparent distress.  Ophthalmologic - Fundi not visualized due to small pupils.  Cardiovascular - Regular rate and rhythm.  Mental Status: Alert, oriented, thought content appropriate. Speech fluent without evidence of aphasia. Able to follow 3 step commands without difficulty. Cranial Nerves: II: Visual fields grossly normal, pupils equal, round, reactive to light and accommodation III,IV, VI: ptosis not present, extra-ocular motions shows intact lateral gazes, no inferior gaze and when looking superiorly his eyes will go up once and then he is unable to get his eyes to vertically deviate. Vertical diplopia when attempting to look up and down.  V,VII: smile symmetric, facial light touch sensation normal bilaterally VIII: hearing normal bilaterally IX,X: uvula rises symmetrically XI: bilateral shoulder shrug XII: midline tongue extension Motor: Right :Upper extremity 5/5Left: Upper extremity 5/5 Lower extremity 5/5Lower extremity 5/5 Tone and bulk:normal tone throughout; no atrophy noted Sensory: Pinprick and light touch intact throughout, bilaterally Deep Tendon Reflexes: 1+ and symmetric throughout UE right knee in brace and left knee has 2+ DTR. No AJ bilaterally Plantars: Right: downgoingLeft: downgoing Cerebellar: normal finger-to-nose,  Gait: not tested   ASSESSMENT/PLAN Mr. Caleb Lyons is a 82 y.o. male with history of complete heart block s/p ICD, ischemic cardiomyopathy s/p CABG (EF 30-35%), obesity, HTN, HLD, gout, hyperparathyroidism, OSA, and T2DM presenting with bilateral opthlmoplegia. He did not receive IV t-PA due to delay in arrival.   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 R ICA 78% stenosis. L ICA 58%. Basilar artery with severe stenosis/partial occlusion   2D Echo  EF 50-55%. No source of embolus   LDL 36  HgbA1c 6.5  Lovenox 40 mg sq daily for VTE prophylaxis  Diet heart healthy/carb modified Room service appropriate?: Yes; Fluid consistency:: Thin  Diet - low sodium heart healthy  aspirin 81 mg daily prior to admission, now on aspirin 81 mg daily and clopidogrel 75 mg daily. Recommend DAPT for 3 months and then plavix alone.  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  Any further stenosis may involving brainstem infarcts  Continue DAPT for now  BP goal 130-150 for better perfusion  B/L ICA stenosis  Asymptomatic at this time  Need close monitoring as outpt  Continue DAPT for now  Hypertension  Stable 140-150s  Permissive hypertension (OK if < 220/120) but gradually normalize in 5-7 days  Long-term BP goal 130-150 due to vascular stenosis  Avoid hypotension  Hyperlipidemia  Home meds:  lipitor 40, resumed in hospital  LDL 36, goal < 70  Continue statin at discharge  Diabetes  HgbA1c 6.5, goal < 7.0  Controlled  Other Stroke Risk Factors  Advanced age  ETOH use, advised to drink no more than 2 drink(s) a day  Morbid Obesity, Body mass index is 56.93 kg/(m^2)., recommend weight loss, diet and exercise as appropriate   Family hx stroke (father)  Coronary artery disease s/p CABG  Obstructive sleep apnea  Hx  ischemic cardiomyopathy   PAF listed in med hx, pt denies  Hospital day # 2  Neurology will sign off. Please call with questions. Pt will follow up with Dr. Erlinda Hong at [facility] in about 2 months. Thanks for the consult.  Rosalin Hawking, MD PhD Stroke  Neurology 06/07/2016 3:29 PM     To contact Stroke Continuity provider, please refer to [email or web address] After hours, contact General Neurology

## 2016-06-07 NOTE — Discharge Summary (Signed)
Name: Caleb Lyons MRN: [id number] DOB: [birth year] 82 y.o. PCP: Ernestene Kiel, MD  Date of Admission: 06/05/2016  2:32 PM Date of Discharge: 06/07/2016 Attending Physician: No att. providers found  Discharge Diagnosis: 1. Cerebrovascular accident causing bilateral ophthalmoplegia  Active Problems:   Ophthalmoplegia of both eyes   Oculomotor nerve palsy   Cerebrovascular accident (CVA) due to thrombosis of basilar artery ([place])   Carotid stenosis   Essential hypertension   Type 2 diabetes mellitus with circulatory disorder (HCC)   Coronary artery disease involving coronary bypass graft of native heart with unspecified angina pectoris   Discharge Medications:   Medication List    STOP taking these medications        tamsulosin 0.4 MG Caps capsule  Commonly known as:  FLOMAX      TAKE these medications        allopurinol 100 MG tablet  Commonly known as:  ZYLOPRIM  Take 200 mg by mouth daily.     ANUCORT-HC 25 MG suppository  Generic drug:  hydrocortisone  Use as directed     aspirin EC 81 MG tablet  Take 1 tablet (81 mg total) by mouth daily.     atorvastatin 40 MG tablet  Commonly known as:  LIPITOR  Take 40 mg by mouth daily.     cinacalcet 30 MG tablet  Commonly known as:  SENSIPAR  Take 1 tablet (30 mg total) by mouth daily with breakfast.     clopidogrel 75 MG tablet  Commonly known as:  PLAVIX  Take 1 tablet (75 mg total) by mouth daily.     colchicine 0.6 MG tablet  Take 0.6 mg by mouth daily.     furosemide 40 MG tablet  Commonly known as:  LASIX  Take 40 mg by mouth daily.     GLUCOSAMINE PO  Take 1 tablet by mouth 2 (two) times daily.     ibuprofen 200 MG tablet  Commonly known as:  ADVIL,MOTRIN  Take 800 mg by mouth every 8 (eight) hours as needed.     JALYN 0.5-0.4 MG Caps  Generic drug:  Dutasteride-Tamsulosin HCl  Take 1 capsule by mouth daily.     levothyroxine 175 MCG tablet  Commonly known as:  SYNTHROID, LEVOTHROID  Take  175 mcg by mouth daily.     metoprolol succinate 50 MG 24 hr tablet  Commonly known as:  TOPROL-XL  Take 50 mg by mouth daily.     multivitamin tablet  Take 1 tablet by mouth daily.     NON FORMULARY  Take 1 tablet by mouth 2 (two) times daily. Super Beta Prostate 600mg   1 cap by mouth once twice daily     potassium chloride 10 MEQ tablet  Commonly known as:  K-DUR,KLOR-CON  Take 10 mEq by mouth daily.     valsartan-hydrochlorothiazide 160-25 MG tablet  Commonly known as:  DIOVAN-HCT  Take 1 tablet by mouth daily.     vitamin C 500 MG tablet  Commonly known as:  ASCORBIC ACID  Take 500 mg by mouth daily.        Disposition and follow-up:   Mr.Caleb Lyons was discharged from [facility] in Stable condition.  At the hospital follow up visit please address:  1.  Cerebrovascular accident causing bilateral ophthalmoplegia, namely impairs ability to depress and elevate gaze in either eye to a variable degree (OD worse than OS), visual acuity intact and no other deficits, started on Plavix for DAPT  for 3 months, then Plavix alone, plan for outpatient OT with neuro rehab  - Pre-diabetes vs diabetes - HbA1c 6.5 borderline  2.  Labs / imaging needed at time of follow-up: CBC, lipid panel, HbA1c  3.  Pending labs/ test needing follow-up: None  Follow-up Appointments:     Follow-up Information    Call [facility].   Specialty:  Rehabilitation   Why:  outpatient  occupational therapy   Contact information:   [address] [place] [id number] [place] [phone]      Follow up with [facility], MD. Schedule an appointment as soon as possible for a visit in 2 weeks.   Specialty:  Internal Medicine   Contact information:   [address] [place] [address] [phone]       Follow up with Xu,Jindong, MD. Go in 2 months.   Specialty:  Neurology   Why:  stroke clinic   Contact  information:   [address] [place] [address] [phone]       Hospital Course by problem list:   1. Cerebrovascular accident causing bilateral ophthalmoplegia - on 7/15 presented as a transfer from [facility] ED for MRI/imaging, CT head negative and outside TPA window on initial presentation. Symptoms began when he acutely developed inability to look down around 8:15AM while watching TV, went to church and then opthalmologist who sent him to ED. Upon arrival in our ED was discovered to have non-MRI-compatible ICD, CTA head and neck showed no vessel cut-offs or masses/bleed/etc, only bilateral carotid stenoses and severe narrowing/partial occlusion of basilar artery, patient was started on Plavix and seen by PT/OT, who recommended outpatient neuro rehab OT. TTE on 7/18 was negative for embolic source, stable for discharge on 7/18.  Discharge Vitals:   BP 159/69 mmHg  Pulse 90  Temp(Src) 98.1 F (36.7 C) (Oral)  Resp 18  Ht 6\' 1"  (1.854 m)  Wt 195.7 kg (431 lb 7 oz)  BMI 56.93 kg/m2  SpO2 93%  Pertinent Labs, Studies, and Procedures:   Lipid Panel     Component Value Date/Time   CHOL 107 06/06/2016 0444   TRIG 214* 06/06/2016 0444   HDL 28* 06/06/2016 0444   CHOLHDL 3.8 06/06/2016 0444   VLDL 43* 06/06/2016 0444   LDLCALC 36 06/06/2016 0444     Ref Range 1d ago    Hgb A1c MFr Bld 4.8 - 5.6 % 6.5 (H)   Comments: (NOTE)      Pre-diabetes: 5.7 - 6.4      Diabetes: >6.4      Glycemic control for adults with diabetes: <7.0     Mean Plasma Glucose mg/dL 140        Ct Angio Head and Neck W/cm &/or Wo Cm  06/05/2016 IMPRESSION: CT HEAD No intracranial hemorrhage. Prominent chronic microvascular changes without CT evidence of large acute infarct. No intracranial enhancing lesion. Global atrophy without hydrocephalus. Opacification ethmoid sinus air cells. Sphenoid sinus air cell opacification with air-fluid level which may indicate acute  sinusitis. Polypoid opacification maxillary sinuses. Mucosal thickening frontal sinuses. CTA NECK 78% diameter stenosis proximal right internal carotid artery. Calcified right common carotid artery and right internal carotid artery which are ectatic extending posterior to the pharynx. Plaque left carotid bifurcation and proximal left internal carotid artery. 58% maximal diameter stenosis. Ectatic left common carotid artery and left internal carotid artery. Mild narrowing origin of the vertebral arteries bilaterally. CTA HEAD Distal aspect of vertebral arteries markedly  narrowed bilaterally. Severe narrowing/ partial occlusion basilar artery. Poor delineation posterior inferior cerebral arteries and anterior inferior cerebral arteries with diminutive size superior cerebellar arteries. Posterior cerebral arteries supplied from anterior circulation. Mild narrowing proximal and mid aspect of the posterior cerebral artery bilaterally. Moderate narrowing posterior cerebral artery branches bilaterally. Anterior circulation without medium or large size vessel significant stenosis or occlusion. Calcification with mild narrowing cavernous segment internal carotid artery bilaterally. Small caliber A1 segment right anterior cerebral artery may be congenitally hypoplastic versus atherosclerotic changes. Middle cerebral artery and A2 segment anterior cerebral artery mild to moderate branch vessel narrowing and irregularity bilaterally. Electronically Signed   By: Genia Del M.D.   On: 06/05/2016 20:32   TTE - 06/07/2016  Study Conclusions - Left ventricle: The cavity size was normal. There was mild  concentric hypertrophy. Systolic function was normal. The  estimated ejection fraction was in the range of 50% to 55%. There  is akinesis of the distal septal and apical myocardium. There is  akinesis of the apicalanterior myocardium. There was a reduced  contribution of atrial contraction to ventricular filling, due  to  increased ventricular diastolic pressure or atrial contractile  dysfunction. Features are consistent with a pseudonormal left  ventricular filling pattern, with concomitant abnormal relaxation  and increased filling pressure (grade 2 diastolic dysfunction).  Doppler parameters are consistent with high ventricular filling  pressure. - Ventricular septum: Septal motion showed paradox. These changes  are consistent with right ventricular pacing. - Aortic valve: Moderate diffuse thickening and calcification,  consistent with sclerosis. - Aorta: Aortic root dimension: 39 mm (ED). - Aortic root: The aortic root was mildly dilated. - Mitral valve: Calcified annulus. - Pulmonary arteries: PA peak pressure: 35 mm Hg (S).  Impressions: - The right ventricular systolic pressure was increased consistent  with mild pulmonary hypertension.  Discharge Instructions: Discharge Instructions    Ambulatory referral to Occupational Therapy    Complete by:  As directed      Call MD for:  difficulty breathing, headache or visual disturbances    Complete by:  As directed      Call MD for:  extreme fatigue    Complete by:  As directed      Call MD for:  persistant dizziness or light-headedness    Complete by:  As directed      Call MD for:  persistant nausea and vomiting    Complete by:  As directed      Diet - low sodium heart healthy    Complete by:  As directed      Discharge instructions    Complete by:  As directed   Please take your medications as prescribed and attend any hospital follow up appointments. We have added Plavix 75 mg daily to your medication list - this should help to prevent stroke in the future but slightly increases your bleeding risk. You can prevent your risk of stroke even more by continuing to keep your blood pressure and cholesterol under control.  Despite all such efforts, you are still at increased risk for another stroke given your history of heart disease  and atherosclerosis. In the future, if you suddenly develop new neurologic symptoms (weakness, numbness, inability to talk) visit the ER immediately, as medication to potentially reverse the stroke must be given within 3 to 4.5 hours of symptom onset, the earlier the better. Don't hesitate to call us with any questions.     Increase activity slowly    Complete by:  As directed            Signed: Asencion Partridge, MD 06/07/2016, 5:27 PM   Pager: [phone]

## 2016-06-07 NOTE — Progress Notes (Signed)
  Date: 06/07/2016  Patient name: Caleb Lyons  Medical record number: [id number]  Date of birth: January 26, 1930   Patient seen and examined. Case d/w residents in detail. I agree with findings and plan as documented in Dr. Durenda Age note.  Patient noted to have improvement in EOM of left eye with improved inferior and superior gaze. He is also noted to have mild improvement in inferior and superior gaze of right eye. Patient notes diplopia on attempted inferior and superior gaze when both eyes are uncovered. Will c/w eye patch for now as recommended by OT. Will need f/u with OT as outpatient. C/w asa, statin and plavix for likely CVA per neuro. No further w/u for now. Patient is stable for d/c home today.     Aldine Contes, MD 06/07/2016, 1:56 PM

## 2016-06-07 NOTE — Progress Notes (Signed)
Subjective: Mr. Glassco is feeling well this morning and is now equipped with an eye-patch over his right eye, which he alternates every 2 hours and says greatly improves his vision. His EOM continue to improve in his left eye, near-full depression/elevation. Minimal improvement with his right eye EOM. With both eyes uncovered he develops dysconjugate gaze with elevation OU and depression OU is limited to that of his bad/right eye. He is greatly looking forward to going home today.   OT saw him yesterday evening and recommended outpatient OT at the neuro rehab center, along with ophthal f/up  Objective: Vital signs in last 24 hours: Filed Vitals:   06/06/16 0814 06/06/16 1355 06/06/16 2131 06/07/16 0544  BP: 144/49 144/56 148/68 133/64  Pulse: 71 81 77 77  Temp: 97.5 F (36.4 C) 98.2 F (36.8 C) 98.7 F (37.1 C) 97.9 F (36.6 C)  TempSrc:  Oral Oral Oral  Resp: 16 16 20 18   Height:    6\' 1"  (1.854 m)  Weight:    195.7 kg (431 lb 7 oz)  SpO2: 97% 95% 96% 99%    Intake/Output Summary (Last 24 hours) at 06/07/16 0850 Last data filed at 06/07/16 0550  Gross per 24 hour  Intake    600 ml  Output   1275 ml  Net   -675 ml   Physical Exam HENT: Normocephalic, atraumatic, no audible carotid bruit Cardiovascular: Regular rate and rhythm, ICD in place Respiratory: Clear to auscultation bilaterally, normal work of breathing Abdomen: Obese, soft, non-tender, non-distended Skin: Warm, moist, intact Neuro: Alert and oriented, impaired EOM (minimal depression and some elevation but with disconjugate gaze/diplopia), visual acuity intact, cranial nerves otherwise intact, strength and sensation grossly intact throughout Psych: Positive affect  Labs / Imaging / Procedures: CBC Latest Ref Rng 06/05/2016 01/21/2010 12/15/2009  WBC 4.0 - 10.5 K/uL 7.8 8.4 8.0  Hemoglobin 13.0 - 17.0 g/dL 15.0 14.7 15.1  Hematocrit 39.0 - 52.0 % 44.4 42.8 45.9  Platelets 150 - 400 K/uL 181 192 214.0   BMP Latest  Ref Rng 06/05/2016 04/09/2014 01/01/2014  Glucose 65 - 99 mg/dL 115(H) - -  BUN 6 - 20 mg/dL 17 - -  Creatinine 0.61 - 1.24 mg/dL 1.03 - -  Sodium 135 - 145 mmol/L 139 - -  Potassium 3.5 - 5.1 mmol/L 3.9 - -  Chloride 101 - 111 mmol/L 103 - -  CO2 22 - 32 mmol/L 28 - -  Calcium 8.9 - 10.3 mg/dL 9.9 9.9 11.2(H)   Lipid Panel     Component Value Date/Time   CHOL 107 06/06/2016 0444   TRIG 214* 06/06/2016 0444   HDL 28* 06/06/2016 0444   CHOLHDL 3.8 06/06/2016 0444   VLDL 43* 06/06/2016 0444   LDLCALC 36 06/06/2016 0444   HbA1c 6.5  Assessment/Plan: Mr. Nortz is an 82 year old gentleman with extensive PMH CAD s/p CABG, ICD placement for complete heart block, morbid obesity, HTN, hyperparathyroidism, OSA, T2DM, HLD who presents with acute onset neurologic deficit most concerning for ischemic stroke.  1. Suspected stoke, outside TPA window, transferred from [facility] for MRI but non-compatible ICD - CTA head and neck negative for mass, bleed, other gross intracranial abnormality, stenosis of carotids, narrowing of vertebrals and severe narrowing of basilar artery - Stroke risk factor modification, continue statin, on Aspirin, HbA1c 6.5  - Started Plavix 75 mg QD - Outpatient OT  - Ophtho follow up  Continue home meds for chronic medical conditions  Dispo: Anticipated discharge today.  LOS: 2 days   Asencion Partridge, MD 06/07/2016, 8:50 AM Pager: [phone]

## 2016-06-07 NOTE — Progress Notes (Signed)
Occupational Therapy Treatment Patient Details Name: Caleb Lyons MRN: [id number] DOB: 29-Dec-1929 Today's Date: 06/07/2016    History of present illness Patient is a 82 y/o male with hx of complete heart block, PAF, obesity, HTN, OSA, DM, HLD presents with vision changes. Cannot get MRI due to pacemaker. CTA head and neck-diameter stenosis proximal right ICA. Plaque left carotid bifurcation and proximal left ICA. 58% maximal diameter stenosis.Severe narrowing/ partial occlusion basilar artery   OT comments  Pt demonstrates significantly improved EOMs today, and denies diplopia.  Able to read large print.  He is independent with visual exercises.  All education completed.  He is eager to return home.   Follow Up Recommendations  Outpatient OT;Supervision - Intermittent    Equipment Recommendations  None recommended by OT    Recommendations for Other Services      Precautions / Restrictions Precautions Precautions: Fall Restrictions Other Position/Activity Restrictions: diplopia       Mobility                   ADL                                                Vision                 Additional Comments: Pt reports he feels his vision is improved.  He demonstrates independence with occulomotor ROM exercises provided to him yesterday, and reports he has been performing these exercises diligently.  He also has patch on eye, and has been rotating between eyes q2hrs.  Patch removed, and pt reports no diplopia in any quadrant at this time.  With glasses on, he is able to read large print, but unable to read small print at this time.  Instructed pt to keep patch off, and only where when he fatigues and diplopia present, and to continue to perform exercises.  He demonstrates full EOMs both eyes today, but with decreased control with inferior and superior gaze both eyes.  He appears to have a homonomous Rt central field deficit.    Instructed he and  wife to follow up with ophthalmologist at discharge and attempt to see ophthalmologist prior to seeing OT and to have Humphrey automated perimetry field test performed.  Also instructed him no driving until cleared by MD.  Discussed implications of visual field deficit on function - it does not appear to be impacting him greatly.  He verbalizes understanding of all.    Perception     Praxis      Cognition   Behavior During Therapy: WFL for tasks assessed/performed Overall Cognitive Status: Within Functional Limits for tasks assessed                       Extremity/Trunk Assessment               Exercises     Shoulder Instructions       General Comments      Pertinent Vitals/ Pain       Pain Assessment: No/denies pain  Home Living                                          Prior Functioning/Environment  Frequency Min 2X/week     Progress Toward Goals  OT Goals(current goals can now be found in the care plan section)     ADL Goals Additional ADL Goal #1: Pt will perform ADLs with supervision Additional ADL Goal #2: Pt will be independent with visual HEP   Plan Discharge plan remains appropriate    Co-evaluation                 End of Session     Activity Tolerance Patient tolerated treatment well   Patient Left in chair;with call bell/phone within reach;with family/visitor present   Nurse Communication          Time: [id number] OT Time Calculation (min): 36 min  Charges: OT General Charges $OT Visit: 1 Procedure OT Treatments $Therapeutic Activity: 23-37 mins   [facility], OTR/L [id number]  Lucille Passy M 06/07/2016, 12:39 PM

## 2016-06-10 ENCOUNTER — Telehealth: Payer: Self-pay | Admitting: Internal Medicine

## 2016-06-10 NOTE — Telephone Encounter (Signed)
Called Mr. Kille - he has an appointment with his ophthalmologist next Monday and then plans to follow up with OT afterward. I advised him that he may not need to be on Lasix AND Diovan - and to discuss potentially discontinuing one with his PCP.

## 2016-06-13 DIAGNOSIS — H53411 Scotoma involving central area, right eye: Secondary | ICD-10-CM | POA: Diagnosis not present

## 2016-06-16 DIAGNOSIS — E785 Hyperlipidemia, unspecified: Secondary | ICD-10-CM | POA: Diagnosis not present

## 2016-06-16 DIAGNOSIS — I10 Essential (primary) hypertension: Secondary | ICD-10-CM | POA: Diagnosis not present

## 2016-06-16 DIAGNOSIS — H4943 Progressive external ophthalmoplegia, bilateral: Secondary | ICD-10-CM | POA: Diagnosis not present

## 2016-06-22 ENCOUNTER — Ambulatory Visit (INDEPENDENT_AMBULATORY_CARE_PROVIDER_SITE_OTHER): Payer: Medicare Other | Admitting: *Deleted

## 2016-06-22 DIAGNOSIS — I255 Ischemic cardiomyopathy: Secondary | ICD-10-CM

## 2016-06-22 NOTE — Progress Notes (Signed)
Remote ICD transmission.   

## 2016-06-29 ENCOUNTER — Encounter: Payer: Self-pay | Admitting: Cardiology

## 2016-07-04 ENCOUNTER — Telehealth: Payer: Self-pay | Admitting: Neurology

## 2016-07-04 NOTE — Telephone Encounter (Signed)
Pt called, c/a his appt. He said it was determined he did not have a stroke. He did not say by what provider.

## 2016-07-12 LAB — CUP PACEART REMOTE DEVICE CHECK
Battery Remaining Percentage: 32 %
Brady Statistic AP VP Percent: 12 %
Brady Statistic AP VS Percent: 1 %
Brady Statistic AS VP Percent: 86 %
Brady Statistic AS VS Percent: 1.4 %
Date Time Interrogation Session: 20170802133139
HIGH POWER IMPEDANCE MEASURED VALUE: 49 Ohm
Implantable Lead Implant Date: 20020930
Implantable Lead Implant Date: 20020930
Implantable Lead Implant Date: 20110303
Implantable Lead Location: 753858
Implantable Lead Location: 753860
Lead Channel Impedance Value: 400 Ohm
Lead Channel Pacing Threshold Amplitude: 0.5 V
Lead Channel Pacing Threshold Pulse Width: 0.5 ms
Lead Channel Sensing Intrinsic Amplitude: 4.2 mV
Lead Channel Setting Pacing Pulse Width: 0.5 ms
MDC IDC LEAD LOCATION: 753859
MDC IDC LEAD MODEL: 7121
MDC IDC MSMT BATTERY REMAINING LONGEVITY: 34 mo
MDC IDC MSMT BATTERY VOLTAGE: 2.86 V
MDC IDC MSMT LEADCHNL RA PACING THRESHOLD AMPLITUDE: 0.75 V
MDC IDC MSMT LEADCHNL RV IMPEDANCE VALUE: 390 Ohm
MDC IDC MSMT LEADCHNL RV PACING THRESHOLD PULSEWIDTH: 0.5 ms
MDC IDC MSMT LEADCHNL RV SENSING INTR AMPL: 11.2 mV
MDC IDC PG SERIAL: 600199
MDC IDC SET LEADCHNL RA PACING AMPLITUDE: 1.75 V
MDC IDC SET LEADCHNL RV PACING AMPLITUDE: 0.75 V
MDC IDC SET LEADCHNL RV SENSING SENSITIVITY: 2 mV
MDC IDC STAT BRADY RA PERCENT PACED: 10 %
MDC IDC STAT BRADY RV PERCENT PACED: 97 %

## 2016-07-18 DIAGNOSIS — H53411 Scotoma involving central area, right eye: Secondary | ICD-10-CM | POA: Diagnosis not present

## 2016-07-19 ENCOUNTER — Other Ambulatory Visit: Payer: Self-pay

## 2016-08-09 ENCOUNTER — Ambulatory Visit: Payer: TRICARE For Life (TFL) | Admitting: Neurology

## 2016-09-21 ENCOUNTER — Ambulatory Visit (INDEPENDENT_AMBULATORY_CARE_PROVIDER_SITE_OTHER): Payer: Medicare Other | Admitting: *Deleted

## 2016-09-21 DIAGNOSIS — I255 Ischemic cardiomyopathy: Secondary | ICD-10-CM

## 2016-09-21 NOTE — Progress Notes (Signed)
Remote ICD transmission.   

## 2016-09-28 ENCOUNTER — Encounter: Payer: Self-pay | Admitting: Cardiology

## 2016-10-05 DIAGNOSIS — M7989 Other specified soft tissue disorders: Secondary | ICD-10-CM | POA: Diagnosis not present

## 2016-10-05 DIAGNOSIS — M109 Gout, unspecified: Secondary | ICD-10-CM | POA: Diagnosis not present

## 2016-10-05 DIAGNOSIS — E039 Hypothyroidism, unspecified: Secondary | ICD-10-CM | POA: Diagnosis not present

## 2016-10-05 DIAGNOSIS — N529 Male erectile dysfunction, unspecified: Secondary | ICD-10-CM | POA: Diagnosis not present

## 2016-10-05 DIAGNOSIS — I10 Essential (primary) hypertension: Secondary | ICD-10-CM | POA: Diagnosis not present

## 2016-10-05 DIAGNOSIS — Z79899 Other long term (current) drug therapy: Secondary | ICD-10-CM | POA: Diagnosis not present

## 2016-10-05 DIAGNOSIS — L989 Disorder of the skin and subcutaneous tissue, unspecified: Secondary | ICD-10-CM | POA: Diagnosis not present

## 2016-10-05 DIAGNOSIS — M79604 Pain in right leg: Secondary | ICD-10-CM | POA: Diagnosis not present

## 2016-10-05 DIAGNOSIS — E785 Hyperlipidemia, unspecified: Secondary | ICD-10-CM | POA: Diagnosis not present

## 2016-10-17 DIAGNOSIS — H5021 Vertical strabismus, right eye: Secondary | ICD-10-CM | POA: Diagnosis not present

## 2016-11-02 LAB — CUP PACEART REMOTE DEVICE CHECK
Brady Statistic AP VS Percent: 1 %
Brady Statistic AS VP Percent: 87 %
Brady Statistic RA Percent Paced: 9.3 %
Brady Statistic RV Percent Paced: 97 %
Date Time Interrogation Session: 20171101101814
HighPow Impedance: 48 Ohm
Implantable Lead Implant Date: 20110303
Implantable Lead Location: 753859
Implantable Lead Model: 7121
Lead Channel Impedance Value: 390 Ohm
Lead Channel Pacing Threshold Pulse Width: 0.5 ms
Lead Channel Sensing Intrinsic Amplitude: 10.1 mV
Lead Channel Sensing Intrinsic Amplitude: 4.4 mV
Lead Channel Setting Pacing Amplitude: 0.75 V
Lead Channel Setting Pacing Amplitude: 1.75 V
MDC IDC LEAD IMPLANT DT: 20020930
MDC IDC LEAD IMPLANT DT: 20020930
MDC IDC LEAD LOCATION: 753858
MDC IDC LEAD LOCATION: 753860
MDC IDC MSMT BATTERY REMAINING LONGEVITY: 31 mo
MDC IDC MSMT BATTERY REMAINING PERCENTAGE: 30 %
MDC IDC MSMT BATTERY VOLTAGE: 2.84 V
MDC IDC MSMT LEADCHNL RA IMPEDANCE VALUE: 400 Ohm
MDC IDC MSMT LEADCHNL RA PACING THRESHOLD AMPLITUDE: 0.75 V
MDC IDC MSMT LEADCHNL RA PACING THRESHOLD PULSEWIDTH: 0.5 ms
MDC IDC MSMT LEADCHNL RV PACING THRESHOLD AMPLITUDE: 0.5 V
MDC IDC PG IMPLANT DT: 20110303
MDC IDC PG SERIAL: 600199
MDC IDC SET LEADCHNL RV PACING PULSEWIDTH: 0.5 ms
MDC IDC SET LEADCHNL RV SENSING SENSITIVITY: 2 mV
MDC IDC STAT BRADY AP VP PERCENT: 11 %
MDC IDC STAT BRADY AS VS PERCENT: 1.4 %

## 2016-11-08 DIAGNOSIS — L918 Other hypertrophic disorders of the skin: Secondary | ICD-10-CM | POA: Diagnosis not present

## 2016-11-08 DIAGNOSIS — D485 Neoplasm of uncertain behavior of skin: Secondary | ICD-10-CM | POA: Diagnosis not present

## 2016-11-08 DIAGNOSIS — D044 Carcinoma in situ of skin of scalp and neck: Secondary | ICD-10-CM | POA: Diagnosis not present

## 2016-11-08 DIAGNOSIS — L570 Actinic keratosis: Secondary | ICD-10-CM | POA: Diagnosis not present

## 2017-01-02 ENCOUNTER — Encounter: Payer: Self-pay | Admitting: Internal Medicine

## 2017-01-04 DIAGNOSIS — Z125 Encounter for screening for malignant neoplasm of prostate: Secondary | ICD-10-CM | POA: Diagnosis not present

## 2017-01-04 DIAGNOSIS — N401 Enlarged prostate with lower urinary tract symptoms: Secondary | ICD-10-CM | POA: Diagnosis not present

## 2017-01-06 ENCOUNTER — Encounter: Payer: Medicare Other | Admitting: Internal Medicine

## 2017-01-12 ENCOUNTER — Encounter: Payer: Self-pay | Admitting: Internal Medicine

## 2017-01-12 ENCOUNTER — Ambulatory Visit (INDEPENDENT_AMBULATORY_CARE_PROVIDER_SITE_OTHER): Payer: Medicare Other | Admitting: Internal Medicine

## 2017-01-12 ENCOUNTER — Encounter (INDEPENDENT_AMBULATORY_CARE_PROVIDER_SITE_OTHER): Payer: Self-pay

## 2017-01-12 VITALS — BP 118/74 | HR 82 | Ht 73.0 in | Wt >= 6400 oz

## 2017-01-12 DIAGNOSIS — Z95810 Presence of automatic (implantable) cardiac defibrillator: Secondary | ICD-10-CM

## 2017-01-12 DIAGNOSIS — I255 Ischemic cardiomyopathy: Secondary | ICD-10-CM | POA: Diagnosis not present

## 2017-01-12 DIAGNOSIS — I442 Atrioventricular block, complete: Secondary | ICD-10-CM

## 2017-01-12 DIAGNOSIS — I2589 Other forms of chronic ischemic heart disease: Secondary | ICD-10-CM | POA: Diagnosis not present

## 2017-01-12 DIAGNOSIS — I5032 Chronic diastolic (congestive) heart failure: Secondary | ICD-10-CM | POA: Diagnosis not present

## 2017-01-12 LAB — CUP PACEART INCLINIC DEVICE CHECK
HIGH POWER IMPEDANCE MEASURED VALUE: 49.2041
Implantable Lead Implant Date: 20020930
Implantable Lead Implant Date: 20110303
Implantable Lead Location: 753860
Implantable Lead Model: 7121
Implantable Pulse Generator Implant Date: 20110303
Lead Channel Impedance Value: 400 Ohm
Lead Channel Pacing Threshold Amplitude: 0.625 V
Lead Channel Pacing Threshold Pulse Width: 0.5 ms
Lead Channel Pacing Threshold Pulse Width: 0.5 ms
Lead Channel Setting Pacing Amplitude: 1.875
Lead Channel Setting Sensing Sensitivity: 2 mV
MDC IDC LEAD IMPLANT DT: 20020930
MDC IDC LEAD LOCATION: 753858
MDC IDC LEAD LOCATION: 753859
MDC IDC MSMT LEADCHNL RA IMPEDANCE VALUE: 387.5 Ohm
MDC IDC MSMT LEADCHNL RA PACING THRESHOLD AMPLITUDE: 0.875 V
MDC IDC MSMT LEADCHNL RA SENSING INTR AMPL: 4.6 mV
MDC IDC MSMT LEADCHNL RV SENSING INTR AMPL: 9.4 mV
MDC IDC PG SERIAL: 600199
MDC IDC SESS DTM: 20180222145200
MDC IDC SET LEADCHNL RV PACING AMPLITUDE: 0.875
MDC IDC SET LEADCHNL RV PACING PULSEWIDTH: 0.5 ms
MDC IDC STAT BRADY RA PERCENT PACED: 9.8 %
MDC IDC STAT BRADY RV PERCENT PACED: 98 %

## 2017-01-12 NOTE — Progress Notes (Signed)
Patient Care Team: Ernestene Kiel, MD as PCP - General (Internal Medicine) Paralee Cancel, MD (Orthopedic Surgery)   HPI  Caleb Lyons is a 83 y.o. male seen in followup for complete heart block and has  ischemic heart disease with prior bypass grafting . He presented 2011 with acute failure to pace via his pacemaker and underwent defibrillator generator upgrade to an ICD. It was elected because he had a class II symptoms to implant just  dual-chamber device.   DOE is acceptable  Edema is stable . He sleeps with CPAP   Cardiac catheterization January 2011 demonstrated patency of great with intercurrent deterioration of LVEF with an estimation about 30-35%.  Echocardiogram however 2011 demonstrated ejection fraction was normal  Myoview scan 3/15 however confirm ejection fraction of 35%. It demonstrated a large inferior wall and apical scar without ischemia. DATE TEST    2011 TTE EF normal   1/11    Cath   EF 30-35 %   3/15/     TTE   EF 35 % Inferior  And apical scar  7/17 TTE EF 50-55    He says blood work, thyroid, potassium, renal function were all checked last month by his PCP and were normal.   He continues to gain weight Although the last year he gained only 8  pounds a fast decreased compared to the 30 pounds the year before.  Functional status is stable and limited. He walks with a cane to his credit.  No Chest pain or edema.      Past Medical History:  Diagnosis Date  . Complete heart block ([place])   . Dual implantable cardiac defibrillator in situ    [employer]  . History of chicken pox   . HLD (hyperlipidemia)   . HTN (hypertension)   . Hyperparathyroidism   . Impotence of organic origin   . Ischemic cardiomyopathy    s/pCABG, Graft patent 3/11; EF 30-35%  . Obesity       . OSA (obstructive sleep apnea)   . PAF (paroxysmal atrial fibrillation) ([place])   . Sinus node dysfunction ([facility])   . Type II or unspecified type diabetes mellitus without mention of  complication, not stated as uncontrolled     Past Surgical History:  Procedure Laterality Date  . CORONARY ARTERY BYPASS GRAFT    . TONSILLECTOMY      Current Outpatient Prescriptions  Medication Sig Dispense Refill  . allopurinol (ZYLOPRIM) 100 MG tablet Take 200 mg by mouth daily.    . ANUCORT-HC 25 MG suppository Use as directed    . aspirin EC 81 MG tablet Take 1 tablet (81 mg total) by mouth daily. 150 tablet 2  . atorvastatin (LIPITOR) 40 MG tablet Take 40 mg by mouth daily.      . cinacalcet (SENSIPAR) 30 MG tablet Take 1 tablet (30 mg total) by mouth daily with breakfast. 90 tablet 0  . clopidogrel (PLAVIX) 75 MG tablet Take 1 tablet (75 mg total) by mouth daily. 90 tablet 3  . colchicine 0.6 MG tablet Take 0.6 mg by mouth daily.    . Dutasteride-Tamsulosin HCl (JALYN) 0.5-0.4 MG CAPS Take 1 capsule by mouth daily.      . furosemide (LASIX) 40 MG tablet Take 40 mg by mouth daily.    . Glucosamine HCl (GLUCOSAMINE PO) Take 1 tablet by mouth 2 (two) times daily.     Marland Kitchen ibuprofen (ADVIL,MOTRIN) 200 MG tablet Take 800 mg by mouth every  8 (eight) hours as needed.    Marland Kitchen levothyroxine (SYNTHROID, LEVOTHROID) 175 MCG tablet Take 175 mcg by mouth daily.      . metoprolol (TOPROL-XL) 50 MG 24 hr tablet Take 50 mg by mouth daily.      . Multiple Vitamin (MULTIVITAMIN) tablet Take 1 tablet by mouth daily.      . NON FORMULARY Take 1 tablet by mouth 2 (two) times daily. Super Beta Prostate 600mg   1 cap by mouth once twice daily    . potassium chloride (K-DUR,KLOR-CON) 10 MEQ tablet Take 10 mEq by mouth daily.    . valsartan-hydrochlorothiazide (DIOVAN-HCT) 160-25 MG per tablet Take 1 tablet by mouth daily.    . vitamin C (ASCORBIC ACID) 500 MG tablet Take 500 mg by mouth daily.       No current facility-administered medications for this visit.     No Known Allergies  Review of Systems negative except from HPI and PMH  Physical Exam BP 118/74   Pulse 82   Ht 6\' 1"  (1.854 m)   Wt  (!) 437 lb (198.2 kg)   SpO2 96%   BMI 57.66 kg/m  Well developed and morbidly obese in no acute distress HENT normal E scleral and icterus clear Neck Supple JVP flat; carotids brisk and full Clear to ausculation Device pocket well healed; without hematoma or erythema.  There is no tethering regular rate and rhythm, no murmurs gallops or rub Soft with active bowel sounds No clubbing cyanosis tr Edema Alert and oriented, grossly normal motor and sensory function Skin Warm and Dry  ECG demonstrates sinus rhythm at 80 with P-synchronous/ AV  pacing   Assessment and  Plan  Ischemic cardiomyopathy  S/p CABG  Complete heart block  Hypertension  Implantable defibrillator-[employer] The patient's device was interrogated.  The information was reviewed. No changes were made in the programming.     Congestive heart failure-chronic-systolic/diastolic  Morbidly obese  Without symptoms of ischemia  Relatively Euvolemic continue current meds  BP well controlled  No intercurrent syncope  Weight loss was again encouraged  Will use a cell adaptor

## 2017-01-12 NOTE — Patient Instructions (Signed)
Medication Instructions: - Your physician recommends that you continue on your current medications as directed. Please refer to the Current Medication list given to you today.  Labwork: - none ordered  Procedures/Testing: - none ordered  Follow-Up: - Remote monitoring is used to monitor your Pacemaker of ICD from home. This monitoring reduces the number of office visits required to check your device to one time per year. It allows [place] to keep an eye on the functioning of your device to ensure it is working properly. You are scheduled for a device check from home on 04/13/17. You may send your transmission at any time that day. If you have a wireless device, the transmission will be sent automatically. After your physician reviews your transmission, you will receive a postcard with your next transmission date.  - Your physician wants you to follow-up in: 1 year with Dr. Caryl Comes. You will receive a reminder letter in the mail two months in advance. If you don't receive a letter, please call our office to schedule the follow-up appointment.  Any Additional Special Instructions Will Be Listed Below (If Applicable).     If you need a refill on your cardiac medications before your next appointment, please call your pharmacy.

## 2017-04-04 DIAGNOSIS — E785 Hyperlipidemia, unspecified: Secondary | ICD-10-CM | POA: Diagnosis not present

## 2017-04-04 DIAGNOSIS — Z79899 Other long term (current) drug therapy: Secondary | ICD-10-CM | POA: Diagnosis not present

## 2017-04-04 DIAGNOSIS — K219 Gastro-esophageal reflux disease without esophagitis: Secondary | ICD-10-CM | POA: Diagnosis not present

## 2017-04-04 DIAGNOSIS — I10 Essential (primary) hypertension: Secondary | ICD-10-CM | POA: Diagnosis not present

## 2017-04-04 DIAGNOSIS — M109 Gout, unspecified: Secondary | ICD-10-CM | POA: Diagnosis not present

## 2017-04-04 DIAGNOSIS — E039 Hypothyroidism, unspecified: Secondary | ICD-10-CM | POA: Diagnosis not present

## 2017-04-04 DIAGNOSIS — M150 Primary generalized (osteo)arthritis: Secondary | ICD-10-CM | POA: Diagnosis not present

## 2017-04-13 ENCOUNTER — Ambulatory Visit (INDEPENDENT_AMBULATORY_CARE_PROVIDER_SITE_OTHER): Payer: Medicare Other | Admitting: *Deleted

## 2017-04-13 DIAGNOSIS — I255 Ischemic cardiomyopathy: Secondary | ICD-10-CM

## 2017-04-13 NOTE — Progress Notes (Signed)
Remote ICD transmission.   

## 2017-04-14 ENCOUNTER — Encounter: Payer: Self-pay | Admitting: Cardiology

## 2017-04-18 LAB — CUP PACEART REMOTE DEVICE CHECK
Battery Remaining Percentage: 25 %
Brady Statistic AP VP Percent: 20 %
Brady Statistic AS VP Percent: 77 %
Brady Statistic AS VS Percent: 1.7 %
Brady Statistic RA Percent Paced: 19 %
Brady Statistic RV Percent Paced: 97 %
Date Time Interrogation Session: 20180524092608
HighPow Impedance: 48 Ohm
Implantable Lead Implant Date: 20020930
Implantable Lead Location: 753858
Implantable Lead Location: 753859
Implantable Lead Location: 753860
Implantable Lead Model: 7121
Implantable Pulse Generator Implant Date: 20110303
Lead Channel Impedance Value: 390 Ohm
Lead Channel Pacing Threshold Amplitude: 0.625 V
Lead Channel Pacing Threshold Pulse Width: 0.5 ms
Lead Channel Pacing Threshold Pulse Width: 0.5 ms
Lead Channel Sensing Intrinsic Amplitude: 0.9 mV
Lead Channel Setting Pacing Amplitude: 0.875
Lead Channel Setting Pacing Amplitude: 1.75 V
Lead Channel Setting Pacing Pulse Width: 0.5 ms
MDC IDC LEAD IMPLANT DT: 20020930
MDC IDC LEAD IMPLANT DT: 20110303
MDC IDC MSMT BATTERY REMAINING LONGEVITY: 26 mo
MDC IDC MSMT BATTERY VOLTAGE: 2.81 V
MDC IDC MSMT LEADCHNL RA PACING THRESHOLD AMPLITUDE: 0.75 V
MDC IDC MSMT LEADCHNL RA SENSING INTR AMPL: 4.7 mV
MDC IDC MSMT LEADCHNL RV IMPEDANCE VALUE: 390 Ohm
MDC IDC SET LEADCHNL RV SENSING SENSITIVITY: 2 mV
MDC IDC STAT BRADY AP VS PERCENT: 1 %
Pulse Gen Serial Number: 600199

## 2017-04-24 DIAGNOSIS — R748 Abnormal levels of other serum enzymes: Secondary | ICD-10-CM | POA: Diagnosis not present

## 2017-04-24 DIAGNOSIS — K8020 Calculus of gallbladder without cholecystitis without obstruction: Secondary | ICD-10-CM | POA: Diagnosis not present

## 2017-05-29 DIAGNOSIS — Z79899 Other long term (current) drug therapy: Secondary | ICD-10-CM | POA: Diagnosis not present

## 2017-05-29 DIAGNOSIS — M109 Gout, unspecified: Secondary | ICD-10-CM | POA: Diagnosis not present

## 2017-06-21 DIAGNOSIS — N401 Enlarged prostate with lower urinary tract symptoms: Secondary | ICD-10-CM | POA: Diagnosis not present

## 2017-06-21 DIAGNOSIS — R351 Nocturia: Secondary | ICD-10-CM | POA: Diagnosis not present

## 2017-07-08 IMAGING — CT CT ANGIO NECK
2 of 12 series · 7 of 33 positions shown · IV contrast (isovue)
Comparison: 06/06/2015 and 02/27/2009 head CT.

CLINICAL DATA: 85-year-old male with visual changes. Concern for
posterior stroke. Subsequent encounter.

EXAM:
CT ANGIOGRAPHY HEAD AND NECK
TECHNIQUE: Multidetector CT imaging of the head and neck was performed using
the standard protocol during bolus administration of intravenous
contrast. Multiplanar CT image reconstructions and MIPs were
obtained to evaluate the vascular anatomy. Carotid stenosis
measurements (when applicable) are obtained utilizing NASCET
criteria, using the distal internal carotid diameter as the
denominator.
CONTRAST:  140 cc Isovue 370. Initial 70 cc bolus unsuccessful
secondary to patient's habitus and streak artifact. After discussion
with Dr. Hisato and Dr. Graziano an additional 70 cc was injected.
Patient will be hydrated.

[Series 12: cta neck · axial · 0.47mm/px · z∈[+19,+359]mm · 3 of 171 slices shown]
[im 1/171  soft-tissue]
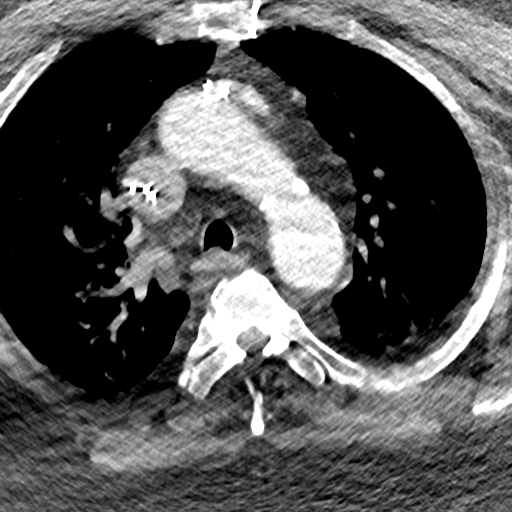
[im 86/171  bone]
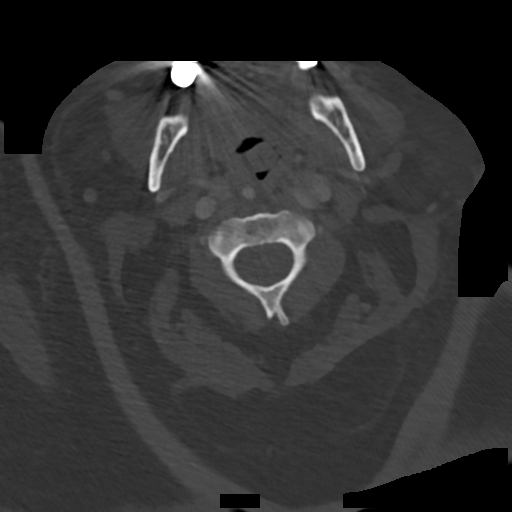
[im 171/171  soft-tissue]
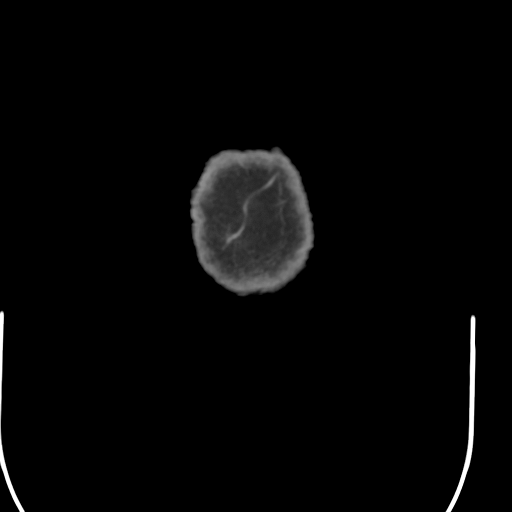

[Series 14: cta neck axial · axial · 0.47mm/px · z∈[+86,+290]mm · 4 of 340 slices shown]
[im 68/340  soft-tissue]
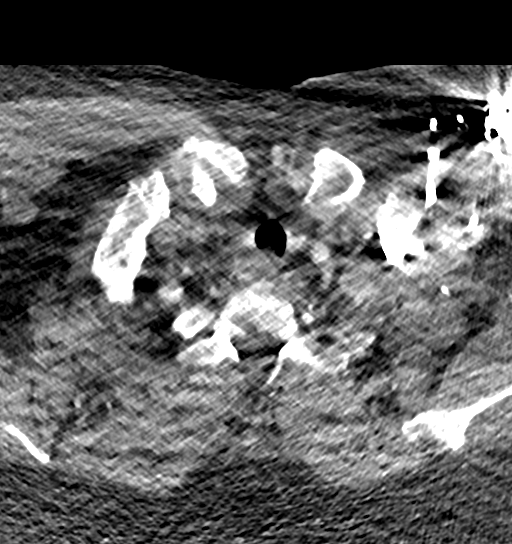
[im 136/340  soft-tissue]
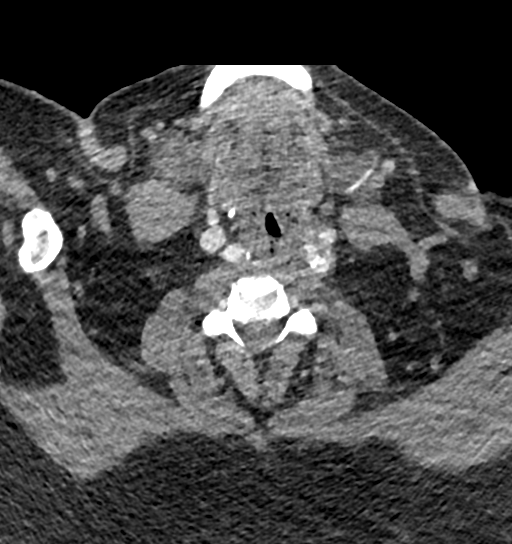
[im 204/340  soft-tissue]
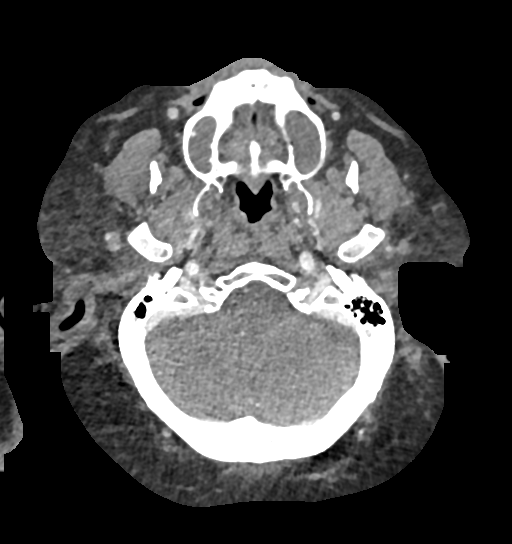
[im 272/340  soft-tissue]
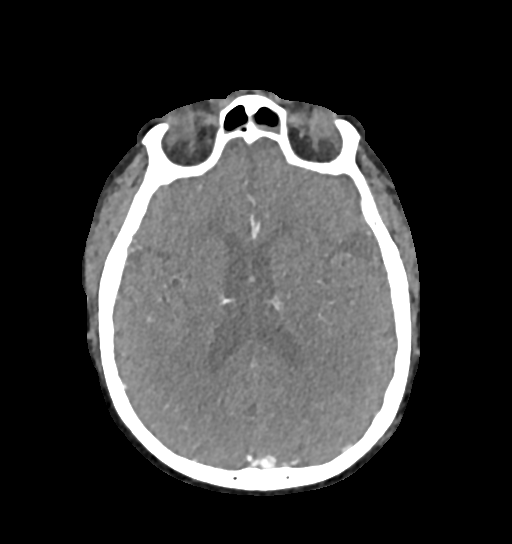

[7 of 33 positions shown; findings below may reference images not displayed]

FINDINGS: CT HEAD

Brain: No intracranial hemorrhage. Prominent chronic microvascular
changes without CT evidence of large acute infarct. No intracranial
enhancing lesion. Global atrophy without hydrocephalus.

Calvarium and skull base: No osseous abnormality. Incidentally noted
small lipoma into right frontal region.

Paranasal sinuses: Opacification ethmoid sinus air cells. Sphenoid
sinus air cell opacification with air-fluid level which may indicate
acute sinusitis. Polypoid opacification maxillary sinuses. Mucosal
thickening frontal sinuses

Orbits: Post lens replacement bilaterally. Post banding on the
right. Mild exophthalmos.

CTA NECK

Aortic arch: Common origin innominate and left common carotid
artery. Atherosclerotic changes aortic arch.

Right carotid system: 78% diameter stenosis proximal right internal
carotid artery. Calcified common carotid artery and right internal
carotid artery which are ectatic extending posterior to the pharynx.

Left carotid system: Plaque left carotid bifurcation and proximal
left internal carotid artery. 58% maximal diameter stenosis. Ectatic
left common carotid artery and left internal carotid artery.

Vertebral arteries:Mild narrowing origin of the vertebral arteries
bilaterally.

Skeleton: Degenerative changes throughout the cervical spine with
various degrees spinal stenosis and foraminal narrowing.

Other neck: No worrisome neck mass.

Upper chest: Minimal pleural thickening. Post CABG. Pacemaker in
place.

CTA HEAD

Anterior circulation: Anterior circulation without medium or large
size vessel significant stenosis or occlusion. Calcification with
mild narrowing cavernous segment internal carotid artery
bilaterally. Fetal origin of the posterior cerebral artery
bilaterally. Small caliber A1 segment right anterior cerebral artery
may be congenitally hypoplastic versus atherosclerotic changes.
Middle cerebral artery and A2 segment anterior cerebral artery mild
moderate branch vessel narrowing and irregularity bilaterally.

Posterior circulation: Distal aspect of vertebral arteries markedly
narrowed bilaterally. Severe narrowing/ partial occlusion basilar
artery. Poor delineation posterior inferior cerebral arteries and
anterior inferior cerebral arteries with diminutive size superior
cerebellar arteries. Mild narrowing proximal and mid aspect of the
posterior cerebral artery bilaterally. Moderate narrowing posterior
cerebral artery branches bilaterally.

Venous sinuses: Patent.

Anatomic variants: As above.

Delayed phase: As above.
IMPRESSION: CT HEAD

No intracranial hemorrhage.

Prominent chronic microvascular changes without CT evidence of large
acute infarct.

No intracranial enhancing lesion.

Global atrophy without hydrocephalus.

Opacification ethmoid sinus air cells. Sphenoid sinus air cell
opacification with air-fluid level which may indicate acute
sinusitis. Polypoid opacification maxillary sinuses. Mucosal
thickening frontal sinuses.

CTA NECK

78% diameter stenosis proximal right internal carotid artery.

Calcified right common carotid artery and right internal carotid
artery which are ectatic extending posterior to the pharynx.

Plaque left carotid bifurcation and proximal left internal carotid
artery. 58% maximal diameter stenosis.

Ectatic left common carotid artery and left internal carotid artery.

Mild narrowing origin of the vertebral arteries bilaterally.

CTA HEAD

Distal aspect of vertebral arteries markedly narrowed bilaterally.

Severe narrowing/ partial occlusion basilar artery.

Poor delineation posterior inferior cerebral arteries and anterior
inferior cerebral arteries with diminutive size superior cerebellar
arteries.

Posterior cerebral arteries supplied from anterior circulation. Mild
narrowing proximal and mid aspect of the posterior cerebral artery
bilaterally. Moderate narrowing posterior cerebral artery branches
bilaterally.

Anterior circulation without medium or large size vessel significant
stenosis or occlusion.

Calcification with mild narrowing cavernous segment internal carotid
artery bilaterally.

Small caliber A1 segment right anterior cerebral artery may be
congenitally hypoplastic versus atherosclerotic changes.

Middle cerebral artery and A2 segment anterior cerebral artery mild
to moderate branch vessel narrowing and irregularity bilaterally.

## 2017-07-13 ENCOUNTER — Ambulatory Visit (INDEPENDENT_AMBULATORY_CARE_PROVIDER_SITE_OTHER): Payer: Medicare Other | Admitting: *Deleted

## 2017-07-13 DIAGNOSIS — I255 Ischemic cardiomyopathy: Secondary | ICD-10-CM

## 2017-07-13 NOTE — Progress Notes (Signed)
Remote ICD transmission.   

## 2017-07-17 DIAGNOSIS — H26492 Other secondary cataract, left eye: Secondary | ICD-10-CM | POA: Diagnosis not present

## 2017-07-19 LAB — CUP PACEART REMOTE DEVICE CHECK
Battery Remaining Percentage: 22 %
Battery Voltage: 2.78 V
Brady Statistic AP VP Percent: 20 %
Brady Statistic AS VP Percent: 77 %
Brady Statistic AS VS Percent: 1.9 %
Brady Statistic RA Percent Paced: 19 %
HIGH POWER IMPEDANCE MEASURED VALUE: 50 Ohm
Implantable Lead Implant Date: 20020930
Implantable Lead Location: 753858
Implantable Lead Location: 753859
Implantable Lead Location: 753860
Implantable Pulse Generator Implant Date: 20110303
Lead Channel Impedance Value: 360 Ohm
Lead Channel Pacing Threshold Amplitude: 0.625 V
Lead Channel Pacing Threshold Pulse Width: 0.5 ms
Lead Channel Pacing Threshold Pulse Width: 0.5 ms
Lead Channel Setting Pacing Amplitude: 0.875
Lead Channel Setting Pacing Pulse Width: 0.5 ms
Lead Channel Setting Sensing Sensitivity: 2 mV
MDC IDC LEAD IMPLANT DT: 20020930
MDC IDC LEAD IMPLANT DT: 20110303
MDC IDC MSMT BATTERY REMAINING LONGEVITY: 23 mo
MDC IDC MSMT LEADCHNL RA IMPEDANCE VALUE: 380 Ohm
MDC IDC MSMT LEADCHNL RA PACING THRESHOLD AMPLITUDE: 0.625 V
MDC IDC MSMT LEADCHNL RA SENSING INTR AMPL: 3.8 mV
MDC IDC MSMT LEADCHNL RV SENSING INTR AMPL: 8.8 mV
MDC IDC PG SERIAL: 600199
MDC IDC SESS DTM: 20180823075508
MDC IDC SET LEADCHNL RA PACING AMPLITUDE: 1.625
MDC IDC STAT BRADY AP VS PERCENT: 1 %
MDC IDC STAT BRADY RV PERCENT PACED: 97 %

## 2017-07-21 ENCOUNTER — Encounter: Payer: Self-pay | Admitting: Cardiology

## 2017-08-28 DIAGNOSIS — L820 Inflamed seborrheic keratosis: Secondary | ICD-10-CM | POA: Diagnosis not present

## 2017-10-05 DIAGNOSIS — Z6841 Body Mass Index (BMI) 40.0 and over, adult: Secondary | ICD-10-CM | POA: Diagnosis not present

## 2017-10-05 DIAGNOSIS — E785 Hyperlipidemia, unspecified: Secondary | ICD-10-CM | POA: Diagnosis not present

## 2017-10-05 DIAGNOSIS — Z79899 Other long term (current) drug therapy: Secondary | ICD-10-CM | POA: Diagnosis not present

## 2017-10-05 DIAGNOSIS — R7301 Impaired fasting glucose: Secondary | ICD-10-CM | POA: Diagnosis not present

## 2017-10-05 DIAGNOSIS — E039 Hypothyroidism, unspecified: Secondary | ICD-10-CM | POA: Diagnosis not present

## 2017-10-05 DIAGNOSIS — M150 Primary generalized (osteo)arthritis: Secondary | ICD-10-CM | POA: Diagnosis not present

## 2017-10-05 DIAGNOSIS — I10 Essential (primary) hypertension: Secondary | ICD-10-CM | POA: Diagnosis not present

## 2017-10-05 DIAGNOSIS — M109 Gout, unspecified: Secondary | ICD-10-CM | POA: Diagnosis not present

## 2017-10-17 ENCOUNTER — Ambulatory Visit (INDEPENDENT_AMBULATORY_CARE_PROVIDER_SITE_OTHER): Payer: Medicare Other | Admitting: *Deleted

## 2017-10-17 DIAGNOSIS — I255 Ischemic cardiomyopathy: Secondary | ICD-10-CM

## 2017-10-18 NOTE — Progress Notes (Signed)
Remote ICD transmission.   

## 2017-10-20 ENCOUNTER — Encounter: Payer: Self-pay | Admitting: Cardiology

## 2017-10-29 LAB — CUP PACEART REMOTE DEVICE CHECK
Battery Remaining Longevity: 19 mo
Battery Remaining Percentage: 19 %
Brady Statistic RA Percent Paced: 20 %
Brady Statistic RV Percent Paced: 97 %
HighPow Impedance: 46 Ohm
Implantable Lead Implant Date: 20020930
Implantable Lead Implant Date: 20020930
Implantable Lead Location: 753858
Implantable Lead Location: 753860
Implantable Lead Model: 7121
Implantable Pulse Generator Implant Date: 20110303
Lead Channel Impedance Value: 390 Ohm
Lead Channel Pacing Threshold Amplitude: 0.625 V
Lead Channel Pacing Threshold Amplitude: 1 V
Lead Channel Pacing Threshold Pulse Width: 0.5 ms
Lead Channel Sensing Intrinsic Amplitude: 10.1 mV
Lead Channel Setting Pacing Pulse Width: 0.5 ms
Lead Channel Setting Sensing Sensitivity: 2 mV
MDC IDC LEAD IMPLANT DT: 20110303
MDC IDC LEAD LOCATION: 753859
MDC IDC MSMT BATTERY VOLTAGE: 2.77 V
MDC IDC MSMT LEADCHNL RA IMPEDANCE VALUE: 380 Ohm
MDC IDC MSMT LEADCHNL RA SENSING INTR AMPL: 3.9 mV
MDC IDC MSMT LEADCHNL RV PACING THRESHOLD PULSEWIDTH: 0.5 ms
MDC IDC PG SERIAL: 600199
MDC IDC SESS DTM: 20181127144935
MDC IDC SET LEADCHNL RA PACING AMPLITUDE: 2 V
MDC IDC SET LEADCHNL RV PACING AMPLITUDE: 0.875
MDC IDC STAT BRADY AP VP PERCENT: 21 %
MDC IDC STAT BRADY AP VS PERCENT: 1 %
MDC IDC STAT BRADY AS VP PERCENT: 76 %
MDC IDC STAT BRADY AS VS PERCENT: 1.9 %

## 2017-11-09 DIAGNOSIS — I10 Essential (primary) hypertension: Secondary | ICD-10-CM | POA: Diagnosis not present

## 2017-11-09 DIAGNOSIS — Z79899 Other long term (current) drug therapy: Secondary | ICD-10-CM | POA: Diagnosis not present

## 2017-12-11 DIAGNOSIS — I10 Essential (primary) hypertension: Secondary | ICD-10-CM | POA: Diagnosis not present

## 2018-01-08 DIAGNOSIS — N401 Enlarged prostate with lower urinary tract symptoms: Secondary | ICD-10-CM | POA: Diagnosis not present

## 2018-01-08 DIAGNOSIS — R351 Nocturia: Secondary | ICD-10-CM | POA: Diagnosis not present

## 2018-01-08 DIAGNOSIS — Z125 Encounter for screening for malignant neoplasm of prostate: Secondary | ICD-10-CM | POA: Diagnosis not present

## 2018-01-10 ENCOUNTER — Encounter: Payer: Self-pay | Admitting: Internal Medicine

## 2018-01-16 ENCOUNTER — Ambulatory Visit (INDEPENDENT_AMBULATORY_CARE_PROVIDER_SITE_OTHER): Payer: Medicare Other | Admitting: *Deleted

## 2018-01-16 DIAGNOSIS — I255 Ischemic cardiomyopathy: Secondary | ICD-10-CM

## 2018-01-16 NOTE — Progress Notes (Signed)
Remote ICD transmission.   

## 2018-01-17 ENCOUNTER — Ambulatory Visit (INDEPENDENT_AMBULATORY_CARE_PROVIDER_SITE_OTHER): Payer: Medicare Other | Admitting: Internal Medicine

## 2018-01-17 ENCOUNTER — Encounter: Payer: Self-pay | Admitting: Internal Medicine

## 2018-01-17 VITALS — BP 120/72 | HR 81 | Ht 73.0 in | Wt >= 6400 oz

## 2018-01-17 DIAGNOSIS — I5032 Chronic diastolic (congestive) heart failure: Secondary | ICD-10-CM

## 2018-01-17 DIAGNOSIS — I442 Atrioventricular block, complete: Secondary | ICD-10-CM | POA: Diagnosis not present

## 2018-01-17 DIAGNOSIS — I255 Ischemic cardiomyopathy: Secondary | ICD-10-CM | POA: Diagnosis not present

## 2018-01-17 DIAGNOSIS — Z95810 Presence of automatic (implantable) cardiac defibrillator: Secondary | ICD-10-CM

## 2018-01-17 NOTE — Patient Instructions (Addendum)
Medication Instructions:  Your physician recommends that you continue on your current medications as directed. Please refer to the Current Medication list given to you today.  Labwork: None ordered.  Testing/Procedures: None ordered.  Follow-Up: Your physician recommends that you schedule a follow-up appointment in:  One year Year with Dr Caryl Comes, preferably in [place], [place]   Remote monitoring is used to monitor your ICD from home. This monitoring reduces the number of office visits required to check your device to one time per year. It allows [place] to keep an eye on the functioning of your device to ensure it is working properly. You are scheduled for a device check from home on 04/17/2018. You may send your transmission at any time that day. If you have a wireless device, the transmission will be sent automatically. After your physician reviews your transmission, you will receive a postcard with your next transmission date.    Any Other Special Instructions Will Be Listed Below (If Applicable).     If you need a refill on your cardiac medications before your next appointment, please call your pharmacy.

## 2018-01-17 NOTE — Progress Notes (Signed)
Patient Care Team: Ernestene Kiel, MD as PCP - General (Internal Medicine) Paralee Cancel, MD (Orthopedic Surgery)   HPI  Caleb Lyons is a 84 y.o. male seen in followup for complete heart block and has  ischemic heart disease with prior bypass grafting . He presented 2011 with acute failure to pace via his pacemaker and underwent defibrillator generator upgrade to an ICD. It was elected because he had a class II symptoms to implant just  dual-chamber device.   DOE is acceptable  Edema is stable . He sleeps with CPAP   Cardiac catheterization January 2011 demonstrated patency of great with intercurrent deterioration of LVEF with an estimation about 30-35%.  Echocardiogram however 2011 demonstrated ejection fraction was normal  Myoview scan 3/15 however confirm ejection fraction of 35%. It demonstrated a large inferior wall and apical scar without ischemia. DATE TEST    2011 TTE EF normal   1/11    Cath   EF 30-35 %   3/15/     TTE   EF 35 % Inferior  And apical scar  7/17 TTE EF 50-55    No chest pain.  Chronic dyspnea.  Now walking with a walker.  Mild edema.  He and his wife just sold their house and is downsized.  He regrets not having a workshop.  He tells me that he played football in college and actually tried out for the [employer].  His next door neighbor was [name] and he dated his sister     Past Medical History:  Diagnosis Date  . Complete heart block ([place])   . Dual implantable cardiac defibrillator in situ    [employer]  . History of chicken pox   . HLD (hyperlipidemia)   . HTN (hypertension)   . Hyperparathyroidism   . Impotence of organic origin   . Ischemic cardiomyopathy    s/pCABG, Graft patent 3/11; EF 30-35%  . Obesity       . OSA (obstructive sleep apnea)   . PAF (paroxysmal atrial fibrillation) ([place])   . Sinus node dysfunction ([facility])   . Type II or unspecified type diabetes mellitus without mention of complication, not stated as  uncontrolled     Past Surgical History:  Procedure Laterality Date  . CORONARY ARTERY BYPASS GRAFT    . TONSILLECTOMY      Current Outpatient Medications  Medication Sig Dispense Refill  . allopurinol (ZYLOPRIM) 100 MG tablet Take 200 mg by mouth daily.    . ANUCORT-HC 25 MG suppository Use as directed    . aspirin EC 81 MG tablet Take 1 tablet (81 mg total) by mouth daily. 150 tablet 2  . atorvastatin (LIPITOR) 40 MG tablet Take 40 mg by mouth daily.      . cinacalcet (SENSIPAR) 30 MG tablet Take 1 tablet (30 mg total) by mouth daily with breakfast. 90 tablet 0  . colchicine 0.6 MG tablet Take 0.6 mg by mouth daily.    . Dutasteride-Tamsulosin HCl (JALYN) 0.5-0.4 MG CAPS Take 1 capsule by mouth daily.      . Glucosamine HCl (GLUCOSAMINE PO) Take 1 tablet by mouth 2 (two) times daily.     Marland Kitchen ibuprofen (ADVIL,MOTRIN) 200 MG tablet Take 800 mg by mouth every 8 (eight) hours as needed.    Marland Kitchen levothyroxine (SYNTHROID, LEVOTHROID) 175 MCG tablet Take 175 mcg by mouth daily.      . metoprolol (TOPROL-XL) 50 MG 24 hr tablet Take 50 mg by  mouth daily.      . Multiple Vitamin (MULTIVITAMIN) tablet Take 1 tablet by mouth daily.      . NON FORMULARY Take 1 tablet by mouth 2 (two) times daily. Super Beta Prostate 600mg   1 cap by mouth once twice daily    . olmesartan-hydrochlorothiazide (BENICAR HCT) 20-12.5 MG tablet Take 1 tablet by mouth daily.    . vitamin C (ASCORBIC ACID) 500 MG tablet Take 500 mg by mouth daily.       No current facility-administered medications for this visit.     No Known Allergies  Review of Systems negative except from HPI and PMH  Physical Exam BP 120/72   Pulse 81   Ht 6\' 1"  (1.854 m)   Wt (!) 420 lb 6.4 oz (190.7 kg)   SpO2 92%   BMI 55.47 kg/m  Well developed and Morbidly obese  in no acute distress HENT normal Neck supple  Device pocket well healed; without hematoma or erythema.  There is no tethering  Clear Regular rate and rhythm, no murmurs or  gallops Abd-soft with active BS without hepatomegaly No Clubbing cyanosis tredema Skin-warm and dry A & Oriented  Grossly normal sensory and motor function   ECG demonstrates sinus at 81 with P synchronous pacing  Assessment and  Plan  Ischemic cardiomyopathy  S/p CABG  Complete heart block  Hypertension  Implantable defibrillator-[employer] The patient's device was interrogated.  The information was reviewed. No changes were made in the programming.     Congestive heart failure-chronic-systolic/diastolic  Morbidly obese   Without symptoms of ischemia  Euvolemic continue current meds  Encouraged weight loss  BP well controlled

## 2018-01-18 ENCOUNTER — Encounter: Payer: Self-pay | Admitting: Cardiology

## 2018-01-18 NOTE — Progress Notes (Signed)
Letter  

## 2018-02-01 LAB — CUP PACEART REMOTE DEVICE CHECK
Battery Remaining Longevity: 16 mo
Battery Remaining Percentage: 14 %
Brady Statistic AP VP Percent: 22 %
Brady Statistic AP VS Percent: 1 %
Brady Statistic RA Percent Paced: 21 %
Brady Statistic RV Percent Paced: 97 %
Date Time Interrogation Session: 20190226134619
HighPow Impedance: 49 Ohm
Implantable Lead Implant Date: 20020930
Implantable Lead Location: 753859
Implantable Lead Model: 7121
Implantable Pulse Generator Implant Date: 20110303
Lead Channel Impedance Value: 390 Ohm
Lead Channel Pacing Threshold Amplitude: 0.625 V
Lead Channel Sensing Intrinsic Amplitude: 11.1 mV
Lead Channel Setting Pacing Amplitude: 1.875
Lead Channel Setting Pacing Pulse Width: 0.5 ms
MDC IDC LEAD IMPLANT DT: 20020930
MDC IDC LEAD IMPLANT DT: 20110303
MDC IDC LEAD LOCATION: 753858
MDC IDC LEAD LOCATION: 753860
MDC IDC MSMT BATTERY VOLTAGE: 2.72 V
MDC IDC MSMT LEADCHNL RA IMPEDANCE VALUE: 390 Ohm
MDC IDC MSMT LEADCHNL RA PACING THRESHOLD AMPLITUDE: 0.875 V
MDC IDC MSMT LEADCHNL RA PACING THRESHOLD PULSEWIDTH: 0.5 ms
MDC IDC MSMT LEADCHNL RA SENSING INTR AMPL: 4.2 mV
MDC IDC MSMT LEADCHNL RV PACING THRESHOLD PULSEWIDTH: 0.5 ms
MDC IDC SET LEADCHNL RV PACING AMPLITUDE: 0.875
MDC IDC SET LEADCHNL RV SENSING SENSITIVITY: 2 mV
MDC IDC STAT BRADY AS VP PERCENT: 74 %
MDC IDC STAT BRADY AS VS PERCENT: 2 %
Pulse Gen Serial Number: 600199

## 2018-03-08 LAB — CUP PACEART INCLINIC DEVICE CHECK
Battery Remaining Longevity: 15 mo
Brady Statistic RA Percent Paced: 21 %
Date Time Interrogation Session: 20190227183107
HighPow Impedance: 48 Ohm
Implantable Lead Implant Date: 20020930
Implantable Lead Location: 753859
Lead Channel Pacing Threshold Pulse Width: 0.5 ms
Lead Channel Sensing Intrinsic Amplitude: 4 mV
Lead Channel Setting Pacing Amplitude: 0.875
Lead Channel Setting Pacing Pulse Width: 0.5 ms
MDC IDC LEAD IMPLANT DT: 20020930
MDC IDC LEAD IMPLANT DT: 20110303
MDC IDC LEAD LOCATION: 753858
MDC IDC LEAD LOCATION: 753860
MDC IDC MSMT LEADCHNL RA IMPEDANCE VALUE: 387.5 Ohm
MDC IDC MSMT LEADCHNL RA PACING THRESHOLD AMPLITUDE: 0.75 V
MDC IDC MSMT LEADCHNL RA PACING THRESHOLD PULSEWIDTH: 0.5 ms
MDC IDC MSMT LEADCHNL RV IMPEDANCE VALUE: 362.5 Ohm
MDC IDC MSMT LEADCHNL RV PACING THRESHOLD AMPLITUDE: 0.75 V
MDC IDC PG IMPLANT DT: 20110303
MDC IDC SET LEADCHNL RA PACING AMPLITUDE: 1.75 V
MDC IDC SET LEADCHNL RV SENSING SENSITIVITY: 2 mV
MDC IDC STAT BRADY RV PERCENT PACED: 97 %
Pulse Gen Serial Number: 600199

## 2018-04-10 ENCOUNTER — Telehealth: Payer: Self-pay | Admitting: Cardiology

## 2018-04-10 NOTE — Telephone Encounter (Signed)
Spoke w/ pt and requested that he send a manual transmission b/c his home monitor has not updated in at least 7 days.   

## 2018-04-17 ENCOUNTER — Ambulatory Visit (INDEPENDENT_AMBULATORY_CARE_PROVIDER_SITE_OTHER): Payer: Medicare Other | Admitting: *Deleted

## 2018-04-17 DIAGNOSIS — I255 Ischemic cardiomyopathy: Secondary | ICD-10-CM | POA: Diagnosis not present

## 2018-04-17 DIAGNOSIS — I5032 Chronic diastolic (congestive) heart failure: Secondary | ICD-10-CM

## 2018-04-17 NOTE — Progress Notes (Signed)
Remote ICD transmission.   

## 2018-04-20 ENCOUNTER — Encounter: Payer: Self-pay | Admitting: Cardiology

## 2018-05-08 LAB — CUP PACEART REMOTE DEVICE CHECK
Battery Remaining Percentage: 10 %
Brady Statistic AP VP Percent: 26 %
Brady Statistic AP VS Percent: 1 %
Brady Statistic AS VP Percent: 72 %
Brady Statistic AS VS Percent: 1 %
Brady Statistic RV Percent Paced: 99 %
Date Time Interrogation Session: 20190528100710
HighPow Impedance: 47 Ohm
Implantable Lead Implant Date: 20020930
Implantable Lead Location: 753858
Implantable Lead Location: 753859
Implantable Lead Location: 753860
Implantable Pulse Generator Implant Date: 20110303
Lead Channel Impedance Value: 380 Ohm
Lead Channel Pacing Threshold Amplitude: 0.625 V
Lead Channel Pacing Threshold Pulse Width: 0.5 ms
Lead Channel Sensing Intrinsic Amplitude: 11.4 mV
Lead Channel Setting Pacing Amplitude: 0.875
Lead Channel Setting Pacing Amplitude: 1.875
Lead Channel Setting Pacing Pulse Width: 0.5 ms
MDC IDC LEAD IMPLANT DT: 20020930
MDC IDC LEAD IMPLANT DT: 20110303
MDC IDC MSMT BATTERY REMAINING LONGEVITY: 11 mo
MDC IDC MSMT BATTERY VOLTAGE: 2.68 V
MDC IDC MSMT LEADCHNL RA PACING THRESHOLD AMPLITUDE: 0.875 V
MDC IDC MSMT LEADCHNL RA PACING THRESHOLD PULSEWIDTH: 0.5 ms
MDC IDC MSMT LEADCHNL RA SENSING INTR AMPL: 3.6 mV
MDC IDC MSMT LEADCHNL RV IMPEDANCE VALUE: 360 Ohm
MDC IDC SET LEADCHNL RV SENSING SENSITIVITY: 2 mV
MDC IDC STAT BRADY RA PERCENT PACED: 26 %
Pulse Gen Serial Number: 600199

## 2018-05-14 DIAGNOSIS — H26492 Other secondary cataract, left eye: Secondary | ICD-10-CM | POA: Diagnosis not present

## 2018-05-14 DIAGNOSIS — H35371 Puckering of macula, right eye: Secondary | ICD-10-CM | POA: Diagnosis not present

## 2018-06-07 DIAGNOSIS — K219 Gastro-esophageal reflux disease without esophagitis: Secondary | ICD-10-CM | POA: Diagnosis not present

## 2018-06-07 DIAGNOSIS — E785 Hyperlipidemia, unspecified: Secondary | ICD-10-CM | POA: Diagnosis not present

## 2018-06-07 DIAGNOSIS — M109 Gout, unspecified: Secondary | ICD-10-CM | POA: Diagnosis not present

## 2018-06-07 DIAGNOSIS — E039 Hypothyroidism, unspecified: Secondary | ICD-10-CM | POA: Diagnosis not present

## 2018-06-07 DIAGNOSIS — Z79899 Other long term (current) drug therapy: Secondary | ICD-10-CM | POA: Diagnosis not present

## 2018-06-07 DIAGNOSIS — Z1389 Encounter for screening for other disorder: Secondary | ICD-10-CM | POA: Diagnosis not present

## 2018-06-07 DIAGNOSIS — I10 Essential (primary) hypertension: Secondary | ICD-10-CM | POA: Diagnosis not present

## 2018-06-07 DIAGNOSIS — Z1331 Encounter for screening for depression: Secondary | ICD-10-CM | POA: Diagnosis not present

## 2018-06-07 DIAGNOSIS — Z0001 Encounter for general adult medical examination with abnormal findings: Secondary | ICD-10-CM | POA: Diagnosis not present

## 2018-06-11 DIAGNOSIS — H5213 Myopia, bilateral: Secondary | ICD-10-CM | POA: Diagnosis not present

## 2018-06-21 ENCOUNTER — Other Ambulatory Visit: Payer: Self-pay

## 2018-07-09 DIAGNOSIS — R351 Nocturia: Secondary | ICD-10-CM | POA: Diagnosis not present

## 2018-07-09 DIAGNOSIS — R339 Retention of urine, unspecified: Secondary | ICD-10-CM | POA: Diagnosis not present

## 2018-07-09 DIAGNOSIS — N401 Enlarged prostate with lower urinary tract symptoms: Secondary | ICD-10-CM | POA: Diagnosis not present

## 2018-07-17 ENCOUNTER — Ambulatory Visit (INDEPENDENT_AMBULATORY_CARE_PROVIDER_SITE_OTHER): Payer: Medicare Other | Admitting: *Deleted

## 2018-07-17 DIAGNOSIS — I255 Ischemic cardiomyopathy: Secondary | ICD-10-CM | POA: Diagnosis not present

## 2018-07-17 NOTE — Progress Notes (Signed)
Remote ICD transmission.   

## 2018-07-19 ENCOUNTER — Encounter: Payer: Self-pay | Admitting: Cardiology

## 2018-08-20 LAB — CUP PACEART REMOTE DEVICE CHECK
Battery Remaining Longevity: 8 mo
Battery Remaining Percentage: 7 %
Brady Statistic AP VS Percent: 1 %
Brady Statistic RA Percent Paced: 27 %
Brady Statistic RV Percent Paced: 98 %
HighPow Impedance: 46 Ohm
Implantable Lead Implant Date: 20020930
Implantable Lead Implant Date: 20110303
Implantable Lead Location: 753858
Implantable Lead Location: 753860
Implantable Pulse Generator Implant Date: 20110303
Lead Channel Impedance Value: 380 Ohm
Lead Channel Pacing Threshold Amplitude: 0.75 V
Lead Channel Pacing Threshold Pulse Width: 0.5 ms
Lead Channel Pacing Threshold Pulse Width: 0.5 ms
Lead Channel Sensing Intrinsic Amplitude: 11.9 mV
Lead Channel Sensing Intrinsic Amplitude: 3.9 mV
Lead Channel Setting Pacing Amplitude: 1.75 V
Lead Channel Setting Sensing Sensitivity: 2 mV
MDC IDC LEAD IMPLANT DT: 20020930
MDC IDC LEAD LOCATION: 753859
MDC IDC MSMT BATTERY VOLTAGE: 2.65 V
MDC IDC MSMT LEADCHNL RA IMPEDANCE VALUE: 390 Ohm
MDC IDC MSMT LEADCHNL RV PACING THRESHOLD AMPLITUDE: 0.625 V
MDC IDC PG SERIAL: 600199
MDC IDC SESS DTM: 20190827063455
MDC IDC SET LEADCHNL RV PACING AMPLITUDE: 0.875
MDC IDC SET LEADCHNL RV PACING PULSEWIDTH: 0.5 ms
MDC IDC STAT BRADY AP VP PERCENT: 27 %
MDC IDC STAT BRADY AS VP PERCENT: 71 %
MDC IDC STAT BRADY AS VS PERCENT: 1 %

## 2018-10-16 ENCOUNTER — Ambulatory Visit (INDEPENDENT_AMBULATORY_CARE_PROVIDER_SITE_OTHER): Payer: Medicare Other

## 2018-10-16 DIAGNOSIS — I255 Ischemic cardiomyopathy: Secondary | ICD-10-CM

## 2018-10-16 DIAGNOSIS — I5032 Chronic diastolic (congestive) heart failure: Secondary | ICD-10-CM

## 2018-10-16 NOTE — Progress Notes (Signed)
Remote ICD transmission.   

## 2018-10-22 ENCOUNTER — Telehealth: Payer: Self-pay

## 2018-10-22 NOTE — Telephone Encounter (Signed)
Spoke with pt informed him that his monitor had transmitted an episode of AF that lasted 2 days informed pt that AF put him at an increased risk of a stroke and that he needed an apt with a provider to discuss [facility] and pts wife agreeable to apt with AF clinic on 10/30/2018 at 2:30, number to AF clinic given for directions.

## 2018-10-30 ENCOUNTER — Encounter (HOSPITAL_COMMUNITY): Payer: Self-pay | Admitting: Nurse Practitioner

## 2018-10-30 ENCOUNTER — Ambulatory Visit (HOSPITAL_COMMUNITY)
Admission: RE | Admit: 2018-10-30 | Discharge: 2018-10-30 | Disposition: A | Discharge status: Home / Self Care | Payer: Medicare Other | Source: Ambulatory Visit | Attending: Nurse Practitioner | Admitting: Nurse Practitioner

## 2018-10-30 VITALS — BP 142/84 | HR 70 | Ht 73.0 in | Wt >= 6400 oz

## 2018-10-30 DIAGNOSIS — Z79899 Other long term (current) drug therapy: Secondary | ICD-10-CM | POA: Insufficient documentation

## 2018-10-30 DIAGNOSIS — Z79890 Hormone replacement therapy (postmenopausal): Secondary | ICD-10-CM | POA: Insufficient documentation

## 2018-10-30 DIAGNOSIS — G4733 Obstructive sleep apnea (adult) (pediatric): Secondary | ICD-10-CM | POA: Diagnosis not present

## 2018-10-30 DIAGNOSIS — E785 Hyperlipidemia, unspecified: Secondary | ICD-10-CM | POA: Insufficient documentation

## 2018-10-30 DIAGNOSIS — Z951 Presence of aortocoronary bypass graft: Secondary | ICD-10-CM | POA: Insufficient documentation

## 2018-10-30 DIAGNOSIS — Z7901 Long term (current) use of anticoagulants: Secondary | ICD-10-CM | POA: Diagnosis not present

## 2018-10-30 DIAGNOSIS — Z95810 Presence of automatic (implantable) cardiac defibrillator: Secondary | ICD-10-CM | POA: Diagnosis not present

## 2018-10-30 DIAGNOSIS — I442 Atrioventricular block, complete: Secondary | ICD-10-CM | POA: Insufficient documentation

## 2018-10-30 DIAGNOSIS — I480 Paroxysmal atrial fibrillation: Secondary | ICD-10-CM | POA: Insufficient documentation

## 2018-10-30 DIAGNOSIS — E119 Type 2 diabetes mellitus without complications: Secondary | ICD-10-CM | POA: Insufficient documentation

## 2018-10-30 DIAGNOSIS — I10 Essential (primary) hypertension: Secondary | ICD-10-CM | POA: Diagnosis not present

## 2018-10-30 DIAGNOSIS — I495 Sick sinus syndrome: Secondary | ICD-10-CM | POA: Diagnosis not present

## 2018-10-30 DIAGNOSIS — I255 Ischemic cardiomyopathy: Secondary | ICD-10-CM | POA: Insufficient documentation

## 2018-10-30 DIAGNOSIS — I4891 Unspecified atrial fibrillation: Secondary | ICD-10-CM | POA: Diagnosis not present

## 2018-10-30 DIAGNOSIS — Z6841 Body Mass Index (BMI) 40.0 and over, adult: Secondary | ICD-10-CM | POA: Diagnosis not present

## 2018-10-30 DIAGNOSIS — Z8673 Personal history of transient ischemic attack (TIA), and cerebral infarction without residual deficits: Secondary | ICD-10-CM | POA: Insufficient documentation

## 2018-10-30 LAB — CBC
HEMATOCRIT: 46.9 % (ref 39.0–52.0)
HEMOGLOBIN: 15.4 g/dL (ref 13.0–17.0)
MCH: 32.4 pg (ref 26.0–34.0)
MCHC: 32.8 g/dL (ref 30.0–36.0)
MCV: 98.5 fL (ref 80.0–100.0)
Platelets: 220 10*3/uL (ref 150–400)
RBC: 4.76 MIL/uL (ref 4.22–5.81)
RDW: 13 % (ref 11.5–15.5)
WBC: 8.3 10*3/uL (ref 4.0–10.5)
nRBC: 0 % (ref 0.0–0.2)

## 2018-10-30 MED ORDER — APIXABAN 5 MG PO TABS: 5.0000 mg | ORAL_TABLET | Freq: Two times a day (BID) | ORAL | 0 refills | Status: DC

## 2018-10-30 MED ORDER — APIXABAN 5 MG PO TABS: 5.0000 mg | ORAL_TABLET | Freq: Two times a day (BID) | ORAL | 2 refills | Status: DC

## 2018-10-30 NOTE — Patient Instructions (Signed)
Stop aspirin  Stop ibuprofen, advil, motrin -- may use tylenol as needed for pain  Start Eliquis 5mg  twice a day

## 2018-10-30 NOTE — Progress Notes (Signed)
Primary Care Physician: Ernestene Kiel, MD Referring Physician: Dr. [facility]   Caleb Lyons is a 84 y.o. male with a h/o CHB, s/p PPM, HTN, ischemic cardiomyopathy, morbid obesity that  is int he afib clinic for new onset Afib on his device. Pt is unaware. He currently denies a bleeding history. States that he was on blood thinners in the remote past.He is V paced today.  Today, he denies symptoms of palpitations, chest pain, shortness of breath, orthopnea, PND, lower extremity edema, dizziness, presyncope, syncope, or neurologic sequela. The patient is tolerating medications without difficulties and is otherwise without complaint today.   Past Medical History:  Diagnosis Date  . Complete heart block ([place])   . Dual implantable cardiac defibrillator in situ    [employer]  . History of chicken pox   . HLD (hyperlipidemia)   . HTN (hypertension)   . Hyperparathyroidism   . Impotence of organic origin   . Ischemic cardiomyopathy    s/pCABG, Graft patent 3/11; EF 30-35%  . Obesity       . OSA (obstructive sleep apnea)   . PAF (paroxysmal atrial fibrillation) ([place])   . Sinus node dysfunction ([facility])   . Type II or unspecified type diabetes mellitus without mention of complication, not stated as uncontrolled    Past Surgical History:  Procedure Laterality Date  . CORONARY ARTERY BYPASS GRAFT    . TONSILLECTOMY      Current Outpatient Medications  Medication Sig Dispense Refill  . allopurinol (ZYLOPRIM) 100 MG tablet Take 200 mg by mouth daily.    . ANUCORT-HC 25 MG suppository Use as directed    . atorvastatin (LIPITOR) 40 MG tablet Take 40 mg by mouth daily.      . cinacalcet (SENSIPAR) 30 MG tablet Take 1 tablet (30 mg total) by mouth daily with breakfast. 90 tablet 0  . Dutasteride-Tamsulosin HCl (JALYN) 0.5-0.4 MG CAPS Take 1 capsule by mouth daily.      . Glucosamine HCl (GLUCOSAMINE PO) Take 1 tablet by mouth 2 (two) times daily.     Marland Kitchen levothyroxine (SYNTHROID,  LEVOTHROID) 200 MCG tablet Take 200 mcg by mouth daily before breakfast.    . metoprolol (TOPROL-XL) 50 MG 24 hr tablet Take 50 mg by mouth daily.      . Multiple Vitamin (MULTIVITAMIN) tablet Take 1 tablet by mouth daily.      . NON FORMULARY Take 1 tablet by mouth 2 (two) times daily. Super Beta Prostate 600mg   1 cap by mouth once twice daily    . valsartan-hydrochlorothiazide (DIOVAN-HCT) 160-12.5 MG tablet Take 1 tablet by mouth daily.    . vitamin C (ASCORBIC ACID) 500 MG tablet Take 500 mg by mouth daily.      Marland Kitchen apixaban (ELIQUIS) 5 MG TABS tablet Take 1 tablet (5 mg total) by mouth 2 (two) times daily. 60 tablet 0   No current facility-administered medications for this encounter.     No Known Allergies  Social History   Socioeconomic History  . Marital status: Married    Spouse name: Not on file  . Number of children: Not on file  . Years of education: 34  . Highest education level: Not on file  Occupational History  . Occupation: Retired    Fish farm manager: RETIRED  Social Needs  . Financial resource strain: Not on file  . Food insecurity:    Worry: Not on file    Inability: Not on file  . Transportation needs:  Medical: Not on file    Non-medical: Not on file  Tobacco Use  . Smoking status: Never Smoker  . Smokeless tobacco: Never Used  Substance and Sexual Activity  . Alcohol use: Yes  . Drug use: No  . Sexual activity: Not on file  Lifestyle  . Physical activity:    Days per week: Not on file    Minutes per session: Not on file  . Stress: Not on file  Relationships  . Social connections:    Talks on phone: Not on file    Gets together: Not on file    Attends religious service: Not on file    Active member of club or organization: Not on file    Attends meetings of clubs or organizations: Not on file    Relationship status: Not on file  . Intimate partner violence:    Fear of current or ex partner: Not on file    Emotionally abused: Not on file     Physically abused: Not on file    Forced sexual activity: Not on file  Other Topics Concern  . Not on file  Social History Narrative   Regular exercise-yes    Family History  Problem Relation Age of Onset  . Early death Father   . Stroke Father     ROS- All systems are reviewed and negative except as per the HPI above  Physical Exam: Vitals:   10/30/18 1415  BP: (!) 142/84  Pulse: 70  Weight: (!) 189.6 kg  Height: 6\' 1"  (1.854 m)   Wt Readings from Last 3 Encounters:  10/30/18 (!) 189.6 kg  01/17/18 (!) 190.7 kg  01/12/17 (!) 198.2 kg    Labs: Lab Results  Component Value Date   NA 139 06/05/2016   K 3.9 06/05/2016   CL 103 06/05/2016   CO2 28 06/05/2016   GLUCOSE 115 (H) 06/05/2016   BUN 17 06/05/2016   CREATININE 1.03 06/05/2016   CALCIUM 9.9 06/05/2016   MG 2.0 01/21/2010   Lab Results  Component Value Date   INR 1.87 (H) 01/22/2010   Lab Results  Component Value Date   CHOL 107 06/06/2016   HDL 28 (L) 06/06/2016   LDLCALC 36 06/06/2016   TRIG 214 (H) 06/06/2016     GEN- The patient is well appearing, alert and oriented x 3 today.   Head- normocephalic, atraumatic Eyes-  Sclera clear, conjunctiva pink Ears- hearing intact Oropharynx- clear Neck- supple, no JVP Lymph- no cervical lymphadenopathy Lungs- Clear to ausculation bilaterally, normal work of breathing Heart- Regular rate and rhythm, no murmurs, rubs or gallops, PMI not laterally displaced GI- soft, NT, ND, + BS Extremities- no clubbing, cyanosis, or edema MS- no significant deformity or atrophy Skin- no rash or lesion Psych- euthymic mood, full affect Neuro- strength and sensation are intact  EKG-v paced at at 70 bpm, qrs int 172 ms, qtc 488 ms    Assessment and Plan: 1. New onset asymptomatic afib General education re afib Continue metoprolol 50 mg daily Per device clinic  2. [id number] score of at least 6 Denies a bleeding history Discussed options of coumadin, eliquis  and xarelto He would like to use eliquis Will get a baseline cbc/bmet and go ahead and start 5 mg eliquis bid Bleeding  Precautions Stop asa Advised not to use NSAIDS  F/u in 3 weeks  Butch Penny C. Broc Caspers, [facility] [address] [place], [place] [address] [phone]

## 2018-11-07 DIAGNOSIS — N401 Enlarged prostate with lower urinary tract symptoms: Secondary | ICD-10-CM | POA: Diagnosis not present

## 2018-11-07 DIAGNOSIS — N3281 Overactive bladder: Secondary | ICD-10-CM | POA: Diagnosis not present

## 2018-11-20 ENCOUNTER — Encounter (HOSPITAL_COMMUNITY): Payer: Self-pay | Admitting: Nurse Practitioner

## 2018-11-20 ENCOUNTER — Ambulatory Visit (HOSPITAL_COMMUNITY)
Admission: RE | Admit: 2018-11-20 | Discharge: 2018-11-20 | Disposition: A | Discharge status: Home / Self Care | Payer: Medicare Other | Source: Ambulatory Visit | Attending: Nurse Practitioner | Admitting: Nurse Practitioner

## 2018-11-20 VITALS — BP 112/64 | HR 70 | Ht 73.0 in | Wt >= 6400 oz

## 2018-11-20 DIAGNOSIS — I255 Ischemic cardiomyopathy: Secondary | ICD-10-CM | POA: Diagnosis not present

## 2018-11-20 DIAGNOSIS — I4891 Unspecified atrial fibrillation: Secondary | ICD-10-CM | POA: Insufficient documentation

## 2018-11-20 DIAGNOSIS — Z6841 Body Mass Index (BMI) 40.0 and over, adult: Secondary | ICD-10-CM | POA: Insufficient documentation

## 2018-11-20 DIAGNOSIS — I442 Atrioventricular block, complete: Secondary | ICD-10-CM | POA: Insufficient documentation

## 2018-11-20 DIAGNOSIS — Z95810 Presence of automatic (implantable) cardiac defibrillator: Secondary | ICD-10-CM | POA: Insufficient documentation

## 2018-11-20 DIAGNOSIS — I10 Essential (primary) hypertension: Secondary | ICD-10-CM | POA: Diagnosis not present

## 2018-11-20 DIAGNOSIS — Z7901 Long term (current) use of anticoagulants: Secondary | ICD-10-CM | POA: Insufficient documentation

## 2018-11-20 DIAGNOSIS — Z79890 Hormone replacement therapy (postmenopausal): Secondary | ICD-10-CM | POA: Diagnosis not present

## 2018-11-20 DIAGNOSIS — G4733 Obstructive sleep apnea (adult) (pediatric): Secondary | ICD-10-CM | POA: Diagnosis not present

## 2018-11-20 DIAGNOSIS — E785 Hyperlipidemia, unspecified: Secondary | ICD-10-CM | POA: Insufficient documentation

## 2018-11-20 DIAGNOSIS — E119 Type 2 diabetes mellitus without complications: Secondary | ICD-10-CM | POA: Insufficient documentation

## 2018-11-20 DIAGNOSIS — Z823 Family history of stroke: Secondary | ICD-10-CM | POA: Diagnosis not present

## 2018-11-20 DIAGNOSIS — Z79899 Other long term (current) drug therapy: Secondary | ICD-10-CM | POA: Insufficient documentation

## 2018-11-20 DIAGNOSIS — Z951 Presence of aortocoronary bypass graft: Secondary | ICD-10-CM | POA: Insufficient documentation

## 2018-11-20 LAB — CBC
HCT: 47.6 % (ref 39.0–52.0)
HEMOGLOBIN: 15.5 g/dL (ref 13.0–17.0)
MCH: 31.8 pg (ref 26.0–34.0)
MCHC: 32.6 g/dL (ref 30.0–36.0)
MCV: 97.7 fL (ref 80.0–100.0)
Platelets: 221 10*3/uL (ref 150–400)
RBC: 4.87 MIL/uL (ref 4.22–5.81)
RDW: 13 % (ref 11.5–15.5)
WBC: 8.8 10*3/uL (ref 4.0–10.5)
nRBC: 0 % (ref 0.0–0.2)

## 2018-11-20 LAB — BASIC METABOLIC PANEL
Anion gap: 11 (ref 5–15)
BUN: 19 mg/dL (ref 8–23)
CHLORIDE: 102 mmol/L (ref 98–111)
CO2: 27 mmol/L (ref 22–32)
CREATININE: 1.25 mg/dL — AB (ref 0.61–1.24)
Calcium: 9.5 mg/dL (ref 8.9–10.3)
GFR calc Af Amer: >60 mL/min (ref >60)
GFR calc non Af Amer: 52 mL/min — ABNORMAL LOW (ref >60)
Glucose, Bld: 137 mg/dL — ABNORMAL HIGH (ref 70–99)
Potassium: 3.8 mmol/L (ref 3.5–5.1)
SODIUM: 140 mmol/L (ref 135–145)

## 2018-11-20 NOTE — Progress Notes (Signed)
Primary Care Physician: Ernestene Kiel, MD Referring Physician: Dr. [facility]   Caleb Lyons is a 84 y.o. male with a h/o CHB, s/p PPM, HTN, ischemic cardiomyopathy, morbid obesity that  is in  the afib clinic for new onset Afib on his PPM. Pt is unaware. He currently denies a bleeding history. States that he was on blood thinners in the remote past. He is V paced today.  F/u in afib clinic 12/31. He was started on eliquis 5 mg bid and denies any issues with bleeding. He is not aware of any heart irregularity. He is v paced.  Today, he denies symptoms of palpitations, chest pain, shortness of breath, orthopnea, PND, lower extremity edema, dizziness, presyncope, syncope, or neurologic sequela. The patient is tolerating medications without difficulties and is otherwise without complaint today.   Past Medical History:  Diagnosis Date  . Complete heart block ([place])   . Dual implantable cardiac defibrillator in situ    [employer]  . History of chicken pox   . HLD (hyperlipidemia)   . HTN (hypertension)   . Hyperparathyroidism   . Impotence of organic origin   . Ischemic cardiomyopathy    s/pCABG, Graft patent 3/11; EF 30-35%  . Obesity       . OSA (obstructive sleep apnea)   . PAF (paroxysmal atrial fibrillation) ([place])   . Sinus node dysfunction (HCC)   . Type II or unspecified type diabetes mellitus without mention of complication, not stated as uncontrolled    Past Surgical History:  Procedure Laterality Date  . CORONARY ARTERY BYPASS GRAFT    . TONSILLECTOMY      Current Outpatient Medications  Medication Sig Dispense Refill  . allopurinol (ZYLOPRIM) 100 MG tablet Take 200 mg by mouth daily.    Marland Kitchen apixaban (ELIQUIS) 5 MG TABS tablet Take 1 tablet (5 mg total) by mouth 2 (two) times daily. 180 tablet 2  . atorvastatin (LIPITOR) 40 MG tablet Take 40 mg by mouth daily.      . cinacalcet (SENSIPAR) 30 MG tablet Take 1 tablet (30 mg total) by mouth daily with  breakfast. 90 tablet 0  . dutasteride (AVODART) 0.5 MG capsule Take 0.5 mg by mouth daily.    . Glucosamine HCl (GLUCOSAMINE PO) Take 1 tablet by mouth 2 (two) times daily.     Marland Kitchen levothyroxine (SYNTHROID, LEVOTHROID) 200 MCG tablet Take 200 mcg by mouth daily before breakfast.    . metoprolol (TOPROL-XL) 50 MG 24 hr tablet Take 50 mg by mouth daily.      . Multiple Vitamin (MULTIVITAMIN) tablet Take 1 tablet by mouth daily.      . NON FORMULARY Take 1 tablet by mouth 2 (two) times daily. Super Beta Prostate 600mg   1 cap by mouth once twice daily    . omeprazole (PRILOSEC) 20 MG capsule Take 20 mg by mouth daily.    . potassium chloride (K-DUR,KLOR-CON) 10 MEQ tablet Take 10 mEq by mouth once.    . tamsulosin (FLOMAX) 0.4 MG CAPS capsule Take 0.4 mg by mouth.    . valsartan-hydrochlorothiazide (DIOVAN-HCT) 160-12.5 MG tablet Take 1 tablet by mouth daily.    . vitamin C (ASCORBIC ACID) 500 MG tablet Take 500 mg by mouth daily.      Nicolette Bang 25 MG suppository Use as directed     No current facility-administered medications for this encounter.     No Known Allergies  Social History   Socioeconomic History  . Marital status: Married  Spouse name: Not on file  . Number of children: Not on file  . Years of education: 50  . Highest education level: Not on file  Occupational History  . Occupation: Retired    Fish farm manager: RETIRED  Social Needs  . Financial resource strain: Not on file  . Food insecurity:    Worry: Not on file    Inability: Not on file  . Transportation needs:    Medical: Not on file    Non-medical: Not on file  Tobacco Use  . Smoking status: Never Smoker  . Smokeless tobacco: Never Used  Substance and Sexual Activity  . Alcohol use: Yes  . Drug use: No  . Sexual activity: Not on file  Lifestyle  . Physical activity:    Days per week: Not on file    Minutes per session: Not on file  . Stress: Not on file  Relationships  . Social connections:    Talks on  phone: Not on file    Gets together: Not on file    Attends religious service: Not on file    Active member of club or organization: Not on file    Attends meetings of clubs or organizations: Not on file    Relationship status: Not on file  . Intimate partner violence:    Fear of current or ex partner: Not on file    Emotionally abused: Not on file    Physically abused: Not on file    Forced sexual activity: Not on file  Other Topics Concern  . Not on file  Social History Narrative   Regular exercise-yes    Family History  Problem Relation Age of Onset  . Early death Father   . Stroke Father     ROS- All systems are reviewed and negative except as per the HPI above  Physical Exam: Vitals:   11/20/18 1355  BP: 112/64  Pulse: 70  Weight: (!) 185.5 kg  Height: 6\' 1"  (1.854 m)   Wt Readings from Last 3 Encounters:  11/20/18 (!) 185.5 kg  10/30/18 (!) 189.6 kg  01/17/18 (!) 190.7 kg    Labs: Lab Results  Component Value Date   NA 139 06/05/2016   K 3.9 06/05/2016   CL 103 06/05/2016   CO2 28 06/05/2016   GLUCOSE 115 (H) 06/05/2016   BUN 17 06/05/2016   CREATININE 1.03 06/05/2016   CALCIUM 9.9 06/05/2016   MG 2.0 01/21/2010   Lab Results  Component Value Date   INR 1.87 (H) 01/22/2010   Lab Results  Component Value Date   CHOL 107 06/06/2016   HDL 28 (L) 06/06/2016   LDLCALC 36 06/06/2016   TRIG 214 (H) 06/06/2016     GEN- The patient is well appearing, alert and oriented x 3 today.   Head- normocephalic, atraumatic Eyes-  Sclera clear, conjunctiva pink Ears- hearing intact Oropharynx- clear Neck- supple, no JVP Lymph- no cervical lymphadenopathy Lungs- Clear to ausculation bilaterally, normal work of breathing Heart- Regular rate and rhythm, no murmurs, rubs or gallops, PMI not laterally displaced GI- soft, NT, ND, + BS Extremities- no clubbing, cyanosis, or edema MS- no significant deformity or atrophy Skin- no rash or lesion Psych- euthymic  mood, full affect Neuro- strength and sensation are intact  EKG-v paced at at 70 bpm, qrs int 180 ms, qtc 4520 ms    Assessment and Plan: 1. New onset asymptomatic afib General education re afib Continue metoprolol 50 mg daily Per device clinic  2.  [id number] score of at least 6 Denies a bleeding history Discussed options of coumadin, eliquis and xarelto He would like to use eliquis Continue 5 mg eliquis bid Bleeding  Precautions Stop asa Advised not to use NSAIDS Cbc/bmet today  F/u with Dr. Caryl Comes 2/25  Geroge Baseman. [place], [facility] [address] [place], [place] [address] [phone]

## 2018-12-07 LAB — CUP PACEART REMOTE DEVICE CHECK
Battery Remaining Longevity: 5 mo
Battery Remaining Percentage: 5 %
Battery Voltage: 2.63 V
Brady Statistic AP VP Percent: 29 %
Brady Statistic RA Percent Paced: 28 %
Brady Statistic RV Percent Paced: 98 %
Date Time Interrogation Session: 20191126070014
HighPow Impedance: 46 Ohm
Implantable Lead Implant Date: 20020930
Implantable Lead Implant Date: 20110303
Implantable Lead Location: 753859
Implantable Lead Location: 753860
Implantable Lead Model: 7121
Implantable Pulse Generator Implant Date: 20110303
Lead Channel Impedance Value: 380 Ohm
Lead Channel Impedance Value: 390 Ohm
Lead Channel Pacing Threshold Amplitude: 0.625 V
Lead Channel Pacing Threshold Pulse Width: 0.5 ms
Lead Channel Sensing Intrinsic Amplitude: 10.2 mV
Lead Channel Sensing Intrinsic Amplitude: 3.8 mV
Lead Channel Setting Pacing Amplitude: 0.875
Lead Channel Setting Pacing Pulse Width: 0.5 ms
MDC IDC LEAD IMPLANT DT: 20020930
MDC IDC LEAD LOCATION: 753858
MDC IDC MSMT LEADCHNL RV PACING THRESHOLD AMPLITUDE: 0.625 V
MDC IDC MSMT LEADCHNL RV PACING THRESHOLD PULSEWIDTH: 0.5 ms
MDC IDC PG SERIAL: 600199
MDC IDC SET LEADCHNL RA PACING AMPLITUDE: 1.625
MDC IDC SET LEADCHNL RV SENSING SENSITIVITY: 2 mV
MDC IDC STAT BRADY AP VS PERCENT: 1 %
MDC IDC STAT BRADY AS VP PERCENT: 70 %
MDC IDC STAT BRADY AS VS PERCENT: 1 %

## 2018-12-18 ENCOUNTER — Telehealth: Payer: Self-pay

## 2018-12-18 NOTE — Telephone Encounter (Signed)
Spoke with pt informed him that his device at reached ERI, and to make sure he kept his apt with Dr. Caryl Comes on 12/24/2018, pt voiced understanding.

## 2018-12-24 ENCOUNTER — Encounter: Payer: Self-pay | Admitting: Internal Medicine

## 2018-12-24 ENCOUNTER — Ambulatory Visit (INDEPENDENT_AMBULATORY_CARE_PROVIDER_SITE_OTHER): Payer: Medicare Other | Admitting: Internal Medicine

## 2018-12-24 VITALS — BP 130/86 | HR 71 | Ht 73.0 in | Wt >= 6400 oz

## 2018-12-24 DIAGNOSIS — I442 Atrioventricular block, complete: Secondary | ICD-10-CM

## 2018-12-24 DIAGNOSIS — I255 Ischemic cardiomyopathy: Secondary | ICD-10-CM

## 2018-12-24 DIAGNOSIS — Z95810 Presence of automatic (implantable) cardiac defibrillator: Secondary | ICD-10-CM | POA: Diagnosis not present

## 2018-12-24 NOTE — Progress Notes (Signed)
Patient Care Team: Caleb Kiel, MD as PCP - General (Internal Medicine) Caleb Cancel, MD (Orthopedic Surgery)   HPI  Caleb Lyons is a 85 y.o. male seen in followup for complete heart block and has  ischemic heart disease with prior bypass grafting . He presented 2011 with acute failure to pace via his pacemaker and underwent defibrillator generator upgrade to an ICD. It was elected because he had a class II symptoms to implant just  dual-chamber device.   DOE is acceptable  Edema is stable . He sleeps with CPAP   Cardiac catheterization January 2011 demonstrated patency of great with intercurrent deterioration of LVEF with an estimation about 30-35%.  Echocardiogram however 2011 demonstrated ejection fraction was normal  Myoview scan 3/15 however confirm ejection fraction of 35%. It demonstrated a large inferior wall and apical scar without ischemia. DATE TEST    2011 TTE EF normal   1/11    Cath   EF 30-35 %   3/15/     TTE   EF 35 % Inferior  And apical scar  7/17 TTE EF 50-55    No chest pain.  Chronic shortness of breath.  Chronic edema.  Device is reached ERI.Marland Kitchen  He tells me that he played football in college and actually tried out for the [employer].  His next door neighbor was Caleb Lyons and he dated his sister     Past Medical History:  Diagnosis Date  . Complete heart block ([place])   . Dual implantable cardiac defibrillator in situ    [employer]  . History of chicken pox   . HLD (hyperlipidemia)   . HTN (hypertension)   . Hyperparathyroidism   . Impotence of organic origin   . Ischemic cardiomyopathy    s/pCABG, Graft patent 3/11; EF 30-35%  . Obesity       . OSA (obstructive sleep apnea)   . PAF (paroxysmal atrial fibrillation) ([place])   . Sinus node dysfunction (HCC)   . Type II or unspecified type diabetes mellitus without mention of complication, not stated as uncontrolled     Past Surgical History:  Procedure Laterality Date  . CORONARY  ARTERY BYPASS GRAFT    . TONSILLECTOMY      Current Outpatient Medications  Medication Sig Dispense Refill  . allopurinol (ZYLOPRIM) 100 MG tablet Take 200 mg by mouth daily.    . ANUCORT-HC 25 MG suppository Use as directed    . apixaban (ELIQUIS) 5 MG TABS tablet Take 1 tablet (5 mg total) by mouth 2 (two) times daily. 180 tablet 2  . atorvastatin (LIPITOR) 40 MG tablet Take 40 mg by mouth daily.      . cinacalcet (SENSIPAR) 30 MG tablet Take 1 tablet (30 mg total) by mouth daily with breakfast. 90 tablet 0  . Glucosamine HCl (GLUCOSAMINE PO) Take 1 tablet by mouth 2 (two) times daily.     Marland Kitchen levothyroxine (SYNTHROID, LEVOTHROID) 200 MCG tablet Take 200 mcg by mouth daily before breakfast.    . metoprolol (TOPROL-XL) 50 MG 24 hr tablet Take 50 mg by mouth daily.      . Multiple Vitamin (MULTIVITAMIN) tablet Take 1 tablet by mouth daily.      . NON FORMULARY Take 1 tablet by mouth 2 (two) times daily. Super Beta Prostate 600mg   1 cap by mouth once twice daily    . omeprazole (PRILOSEC) 20 MG capsule Take 20 mg by mouth daily.    Marland Kitchen  potassium chloride (K-DUR,KLOR-CON) 10 MEQ tablet Take 10 mEq by mouth once.    . valsartan-hydrochlorothiazide (DIOVAN-HCT) 160-12.5 MG tablet Take 1 tablet by mouth daily.    . vitamin C (ASCORBIC ACID) 500 MG tablet Take 500 mg by mouth daily.       No current facility-administered medications for this visit.     No Known Allergies  Review of Systems negative except from HPI and PMH  Physical Exam BP 130/86   Pulse 71   Ht 6\' 1"  (1.854 m)   Wt (!) 412 lb (186.9 kg)   SpO2 95%   BMI 54.36 kg/m  Well developed and Morbidly obese in no acute distress HENT normal Neck supple with JVP-flat Clear Device pocket well healed; without hematoma or erythema.  There is no tethering  Regular rate and rhythm, no  gallop  murmur Abd-soft with active BS No Clubbing cyanosis 2-3 +  edema Skin-warm and dry A & Oriented  Grossly normal sensory and motor  function   ECG demonstrates AV pacing diverticulosis okay  Assessment and  Plan  Ischemic cardiomyopathy  S/p CABG  Complete heart block  Hypertension  Implantable defibrillator-[employer] The patient's device was interrogated.  The information was reviewed. No changes were made in the programming.     Congestive heart failure-chronic-systolic/diastolic  Morbidly obese    Device has reached ERI We have reviewed the benefits and risks of generator replacement.  These include but are not limited to lead fracture and infection.  The patient understands, agrees and is willing to proceed.    Discussed downgrading of device vis a vis end of life discussions-- he would like to do so  We spent more than 50% of our >25 min visit in face to face counseling regarding the above

## 2018-12-24 NOTE — H&P (View-Only) (Signed)
Patient Care Team: Ernestene Kiel, MD as PCP - General (Internal Medicine) Paralee Cancel, MD (Orthopedic Surgery)   HPI  Caleb Lyons is a 85 y.o. male seen in followup for complete heart block and has  ischemic heart disease with prior bypass grafting . He presented 2011 with acute failure to pace via his pacemaker and underwent defibrillator generator upgrade to an ICD. It was elected because he had a class II symptoms to implant just  dual-chamber device.   DOE is acceptable  Edema is stable . He sleeps with CPAP   Cardiac catheterization January 2011 demonstrated patency of great with intercurrent deterioration of LVEF with an estimation about 30-35%.  Echocardiogram however 2011 demonstrated ejection fraction was normal  Myoview scan 3/15 however confirm ejection fraction of 35%. It demonstrated a large inferior wall and apical scar without ischemia. DATE TEST    2011 TTE EF normal   1/11    Cath   EF 30-35 %   3/15/     TTE   EF 35 % Inferior  And apical scar  7/17 TTE EF 50-55    No chest pain.  Chronic shortness of breath.  Chronic edema.  Device is reached ERI.Marland Kitchen  He tells me that he played football in college and actually tried out for the [employer].  His next door neighbor was [name] and he dated his sister     Past Medical History:  Diagnosis Date  . Complete heart block ([place])   . Dual implantable cardiac defibrillator in situ    [employer]  . History of chicken pox   . HLD (hyperlipidemia)   . HTN (hypertension)   . Hyperparathyroidism   . Impotence of organic origin   . Ischemic cardiomyopathy    s/pCABG, Graft patent 3/11; EF 30-35%  . Obesity       . OSA (obstructive sleep apnea)   . PAF (paroxysmal atrial fibrillation) ([place])   . Sinus node dysfunction (HCC)   . Type II or unspecified type diabetes mellitus without mention of complication, not stated as uncontrolled     Past Surgical History:  Procedure Laterality Date  . CORONARY  ARTERY BYPASS GRAFT    . TONSILLECTOMY      Current Outpatient Medications  Medication Sig Dispense Refill  . allopurinol (ZYLOPRIM) 100 MG tablet Take 200 mg by mouth daily.    . ANUCORT-HC 25 MG suppository Use as directed    . apixaban (ELIQUIS) 5 MG TABS tablet Take 1 tablet (5 mg total) by mouth 2 (two) times daily. 180 tablet 2  . atorvastatin (LIPITOR) 40 MG tablet Take 40 mg by mouth daily.      . cinacalcet (SENSIPAR) 30 MG tablet Take 1 tablet (30 mg total) by mouth daily with breakfast. 90 tablet 0  . Glucosamine HCl (GLUCOSAMINE PO) Take 1 tablet by mouth 2 (two) times daily.     Marland Kitchen levothyroxine (SYNTHROID, LEVOTHROID) 200 MCG tablet Take 200 mcg by mouth daily before breakfast.    . metoprolol (TOPROL-XL) 50 MG 24 hr tablet Take 50 mg by mouth daily.      . Multiple Vitamin (MULTIVITAMIN) tablet Take 1 tablet by mouth daily.      . NON FORMULARY Take 1 tablet by mouth 2 (two) times daily. Super Beta Prostate 600mg   1 cap by mouth once twice daily    . omeprazole (PRILOSEC) 20 MG capsule Take 20 mg by mouth daily.    Marland Kitchen  potassium chloride (K-DUR,KLOR-CON) 10 MEQ tablet Take 10 mEq by mouth once.    . valsartan-hydrochlorothiazide (DIOVAN-HCT) 160-12.5 MG tablet Take 1 tablet by mouth daily.    . vitamin C (ASCORBIC ACID) 500 MG tablet Take 500 mg by mouth daily.       No current facility-administered medications for this visit.     No Known Allergies  Review of Systems negative except from HPI and PMH  Physical Exam BP 130/86   Pulse 71   Ht 6\' 1"  (1.854 m)   Wt (!) 412 lb (186.9 kg)   SpO2 95%   BMI 54.36 kg/m  Well developed and Morbidly obese in no acute distress HENT normal Neck supple with JVP-flat Clear Device pocket well healed; without hematoma or erythema.  There is no tethering  Regular rate and rhythm, no  gallop  murmur Abd-soft with active BS No Clubbing cyanosis 2-3 +  edema Skin-warm and dry A & Oriented  Grossly normal sensory and motor  function   ECG demonstrates AV pacing diverticulosis okay  Assessment and  Plan  Ischemic cardiomyopathy  S/p CABG  Complete heart block  Hypertension  Implantable defibrillator-[employer] The patient's device was interrogated.  The information was reviewed. No changes were made in the programming.     Congestive heart failure-chronic-systolic/diastolic  Morbidly obese    Device has reached ERI We have reviewed the benefits and risks of generator replacement.  These include but are not limited to lead fracture and infection.  The patient understands, agrees and is willing to proceed.    Discussed downgrading of device vis a vis end of life discussions-- he would like to do so  We spent more than 50% of our >25 min visit in face to face counseling regarding the above

## 2018-12-24 NOTE — Patient Instructions (Signed)
Medication Instructions:  Your physician recommends that you continue on your current medications as directed. Please refer to the Current Medication list given to you today.  Labwork: Please have your pre procedural labs drawn at your local [facility] the week of Feb 17th. I have included your paper orders to bring in with you.  Testing/Procedures: Your physician has recommended that you have an ICD battery change out.   Please see the instruction sheet given to you today for more information.  Follow-Up: Your physician recommends that you schedule a follow-up appointment in:   March 10 @ 11:30am with the device clinic for a wound check.  June 2 @ 2pm with Dr Caryl Comes for a device check with Dr Caryl Comes.  Any Other Special Instructions Will Be Listed Below (If Applicable).     If you need a refill on your cardiac medications before your next appointment, please call your pharmacy.

## 2018-12-25 DIAGNOSIS — L570 Actinic keratosis: Secondary | ICD-10-CM | POA: Diagnosis not present

## 2018-12-25 DIAGNOSIS — L820 Inflamed seborrheic keratosis: Secondary | ICD-10-CM | POA: Diagnosis not present

## 2018-12-25 DIAGNOSIS — L304 Erythema intertrigo: Secondary | ICD-10-CM | POA: Diagnosis not present

## 2018-12-25 DIAGNOSIS — L821 Other seborrheic keratosis: Secondary | ICD-10-CM | POA: Diagnosis not present

## 2018-12-25 LAB — CUP PACEART INCLINIC DEVICE CHECK
Battery Remaining Longevity: 0 mo
Brady Statistic RA Percent Paced: 23 %
Brady Statistic RV Percent Paced: 98 %
Date Time Interrogation Session: 20200203183802
HighPow Impedance: 50.625
Implantable Lead Implant Date: 20020930
Implantable Lead Implant Date: 20020930
Implantable Lead Implant Date: 20110303
Implantable Lead Location: 753858
Implantable Lead Location: 753859
Implantable Lead Location: 753860
Implantable Lead Model: 7121
Implantable Pulse Generator Implant Date: 20110303
Lead Channel Impedance Value: 387.5 Ohm
Lead Channel Pacing Threshold Amplitude: 0.5 V
Lead Channel Pacing Threshold Pulse Width: 0.5 ms
Lead Channel Pacing Threshold Pulse Width: 0.5 ms
Lead Channel Sensing Intrinsic Amplitude: 11.2 mV
Lead Channel Sensing Intrinsic Amplitude: 2.2 mV
Lead Channel Setting Pacing Amplitude: 0.75 V
Lead Channel Setting Pacing Amplitude: 1.75 V
Lead Channel Setting Pacing Pulse Width: 0.5 ms
MDC IDC MSMT LEADCHNL RA IMPEDANCE VALUE: 375 Ohm
MDC IDC MSMT LEADCHNL RA PACING THRESHOLD AMPLITUDE: 0.75 V
MDC IDC SET LEADCHNL RV SENSING SENSITIVITY: 2 mV
Pulse Gen Serial Number: 600199

## 2018-12-27 DIAGNOSIS — N3281 Overactive bladder: Secondary | ICD-10-CM | POA: Diagnosis not present

## 2018-12-27 DIAGNOSIS — N401 Enlarged prostate with lower urinary tract symptoms: Secondary | ICD-10-CM | POA: Diagnosis not present

## 2019-01-08 ENCOUNTER — Other Ambulatory Visit: Payer: Self-pay | Admitting: Internal Medicine

## 2019-01-08 DIAGNOSIS — I442 Atrioventricular block, complete: Secondary | ICD-10-CM | POA: Diagnosis not present

## 2019-01-08 DIAGNOSIS — Z95810 Presence of automatic (implantable) cardiac defibrillator: Secondary | ICD-10-CM | POA: Diagnosis not present

## 2019-01-08 DIAGNOSIS — I259 Chronic ischemic heart disease, unspecified: Secondary | ICD-10-CM | POA: Diagnosis not present

## 2019-01-08 LAB — CBC
HEMOGLOBIN: 15.5 g/dL (ref 13.0–17.7)
Hematocrit: 45.3 % (ref 37.5–51.0)
MCH: 32 pg (ref 26.6–33.0)
MCHC: 34.2 g/dL (ref 31.5–35.7)
MCV: 93 fL (ref 79–97)
PLATELETS: 213 10*3/uL (ref 150–450)
RBC: 4.85 x10E6/uL (ref 4.14–5.80)
RDW: 13.9 % (ref 11.6–15.4)
WBC: 11.4 10*3/uL — ABNORMAL HIGH (ref 3.4–10.8)

## 2019-01-08 LAB — BASIC METABOLIC PANEL
BUN/Creatinine Ratio: 14 (ref 10–24)
BUN: 16 mg/dL (ref 8–27)
CO2: 26 mmol/L (ref 20–29)
Calcium: 10 mg/dL (ref 8.6–10.2)
Chloride: 103 mmol/L (ref 96–106)
Creatinine, Ser: 1.15 mg/dL (ref 0.76–1.27)
GFR calc Af Amer: 66 mL/min/{1.73_m2} (ref >59)
GFR calc non Af Amer: 57 mL/min/{1.73_m2} — ABNORMAL LOW (ref >59)
Glucose: 148 mg/dL — ABNORMAL HIGH (ref 65–99)
Potassium: 3.8 mmol/L (ref 3.5–5.2)
Sodium: 141 mmol/L (ref 134–144)

## 2019-01-15 ENCOUNTER — Ambulatory Visit (INDEPENDENT_AMBULATORY_CARE_PROVIDER_SITE_OTHER): Payer: Medicare Other | Admitting: *Deleted

## 2019-01-15 DIAGNOSIS — I255 Ischemic cardiomyopathy: Secondary | ICD-10-CM | POA: Diagnosis not present

## 2019-01-15 DIAGNOSIS — I5032 Chronic diastolic (congestive) heart failure: Secondary | ICD-10-CM

## 2019-01-16 LAB — CUP PACEART REMOTE DEVICE CHECK
Battery Remaining Longevity: 0 mo
Battery Voltage: 2.59 V
Brady Statistic AP VP Percent: 0 %
Brady Statistic AP VS Percent: 0 %
Brady Statistic AS VP Percent: 100 %
Brady Statistic AS VS Percent: 0 %
Brady Statistic RA Percent Paced: 1 %
Brady Statistic RV Percent Paced: 97 %
Date Time Interrogation Session: 20200225073204
HighPow Impedance: 48 Ohm
Implantable Lead Implant Date: 20020930
Implantable Lead Implant Date: 20020930
Implantable Lead Implant Date: 20110303
Implantable Lead Location: 753858
Implantable Lead Location: 753859
Implantable Lead Location: 753860
Implantable Pulse Generator Implant Date: 20110303
Lead Channel Impedance Value: 350 Ohm
Lead Channel Impedance Value: 390 Ohm
Lead Channel Pacing Threshold Amplitude: 0.625 V
Lead Channel Pacing Threshold Amplitude: 0.75 V
Lead Channel Pacing Threshold Pulse Width: 0.5 ms
Lead Channel Sensing Intrinsic Amplitude: 11.8 mV
Lead Channel Sensing Intrinsic Amplitude: 2.2 mV
Lead Channel Setting Pacing Amplitude: 0.875
Lead Channel Setting Pacing Pulse Width: 0.5 ms
Lead Channel Setting Sensing Sensitivity: 2 mV
MDC IDC MSMT LEADCHNL RA PACING THRESHOLD PULSEWIDTH: 0.5 ms
MDC IDC SET LEADCHNL RA PACING AMPLITUDE: 1.75 V
Pulse Gen Serial Number: 600199

## 2019-01-18 ENCOUNTER — Encounter (HOSPITAL_COMMUNITY)
Admission: RE | Disposition: A | Discharge status: Home / Self Care | Payer: Self-pay | Source: Home / Self Care | Attending: Internal Medicine

## 2019-01-18 ENCOUNTER — Ambulatory Visit (HOSPITAL_COMMUNITY)
Admission: RE | Admit: 2019-01-18 | Discharge: 2019-01-18 | Disposition: A | Discharge status: Home / Self Care | Payer: Medicare Other | Attending: Internal Medicine | Admitting: Internal Medicine

## 2019-01-18 ENCOUNTER — Other Ambulatory Visit: Payer: Self-pay

## 2019-01-18 ENCOUNTER — Encounter (HOSPITAL_COMMUNITY): Payer: Self-pay | Admitting: Internal Medicine

## 2019-01-18 DIAGNOSIS — I110 Hypertensive heart disease with heart failure: Secondary | ICD-10-CM | POA: Diagnosis not present

## 2019-01-18 DIAGNOSIS — Z79899 Other long term (current) drug therapy: Secondary | ICD-10-CM | POA: Insufficient documentation

## 2019-01-18 DIAGNOSIS — I480 Paroxysmal atrial fibrillation: Secondary | ICD-10-CM | POA: Insufficient documentation

## 2019-01-18 DIAGNOSIS — I5042 Chronic combined systolic (congestive) and diastolic (congestive) heart failure: Secondary | ICD-10-CM | POA: Insufficient documentation

## 2019-01-18 DIAGNOSIS — E213 Hyperparathyroidism, unspecified: Secondary | ICD-10-CM | POA: Diagnosis not present

## 2019-01-18 DIAGNOSIS — Z4502 Encounter for adjustment and management of automatic implantable cardiac defibrillator: Secondary | ICD-10-CM | POA: Diagnosis not present

## 2019-01-18 DIAGNOSIS — Z951 Presence of aortocoronary bypass graft: Secondary | ICD-10-CM | POA: Insufficient documentation

## 2019-01-18 DIAGNOSIS — I255 Ischemic cardiomyopathy: Secondary | ICD-10-CM | POA: Insufficient documentation

## 2019-01-18 DIAGNOSIS — G4733 Obstructive sleep apnea (adult) (pediatric): Secondary | ICD-10-CM | POA: Diagnosis not present

## 2019-01-18 DIAGNOSIS — I442 Atrioventricular block, complete: Secondary | ICD-10-CM | POA: Diagnosis not present

## 2019-01-18 DIAGNOSIS — Z7901 Long term (current) use of anticoagulants: Secondary | ICD-10-CM | POA: Insufficient documentation

## 2019-01-18 DIAGNOSIS — E119 Type 2 diabetes mellitus without complications: Secondary | ICD-10-CM | POA: Insufficient documentation

## 2019-01-18 DIAGNOSIS — Z6841 Body Mass Index (BMI) 40.0 and over, adult: Secondary | ICD-10-CM | POA: Insufficient documentation

## 2019-01-18 DIAGNOSIS — Z006 Encounter for examination for normal comparison and control in clinical research program: Secondary | ICD-10-CM | POA: Diagnosis not present

## 2019-01-18 DIAGNOSIS — Z95810 Presence of automatic (implantable) cardiac defibrillator: Secondary | ICD-10-CM

## 2019-01-18 DIAGNOSIS — I495 Sick sinus syndrome: Secondary | ICD-10-CM | POA: Diagnosis not present

## 2019-01-18 DIAGNOSIS — Z79890 Hormone replacement therapy (postmenopausal): Secondary | ICD-10-CM | POA: Insufficient documentation

## 2019-01-18 DIAGNOSIS — E785 Hyperlipidemia, unspecified: Secondary | ICD-10-CM | POA: Insufficient documentation

## 2019-01-18 HISTORY — PX: ICD GENERATOR CHANGEOUT: EP1231

## 2019-01-18 LAB — SURGICAL PCR SCREEN
MRSA, PCR: NEGATIVE
Staphylococcus aureus: NEGATIVE

## 2019-01-18 SURGERY — ICD GENERATOR CHANGEOUT

## 2019-01-18 MED ORDER — SODIUM CHLORIDE 0.9 % IV SOLN
INTRAVENOUS | Status: AC
  Filled 2019-01-18: qty 2

## 2019-01-18 MED ORDER — ACETAMINOPHEN 325 MG PO TABS: 325.0000 mg | ORAL_TABLET | ORAL | Status: DC | PRN

## 2019-01-18 MED ORDER — MUPIROCIN 2 % EX OINT: 1.0000 "application " | TOPICAL_OINTMENT | Freq: Once | CUTANEOUS | Status: DC

## 2019-01-18 MED ORDER — SODIUM CHLORIDE 0.9 % IV SOLN: INTRAVENOUS | Status: DC

## 2019-01-18 MED ORDER — DEXTROSE 5 % IV SOLN
3.0000 g | INTRAVENOUS | Status: AC
  Administered 2019-01-18: 3 g via INTRAVENOUS
  Filled 2019-01-18: qty 3

## 2019-01-18 MED ORDER — ONDANSETRON HCL 4 MG/2ML IJ SOLN: 4.0000 mg | Freq: Four times a day (QID) | INTRAMUSCULAR | Status: DC | PRN

## 2019-01-18 MED ORDER — MUPIROCIN 2 % EX OINT
1.0000 | TOPICAL_OINTMENT | Freq: Once | CUTANEOUS | Status: DC
Start: 2019-01-18 — End: 2019-01-18
  Administered 2019-01-18: 1 via TOPICAL

## 2019-01-18 MED ORDER — MUPIROCIN 2 % EX OINT
TOPICAL_OINTMENT | CUTANEOUS | Status: AC
  Administered 2019-01-18: 1 via TOPICAL
  Filled 2019-01-18: qty 22

## 2019-01-18 MED ORDER — LIDOCAINE HCL (PF) 1 % IJ SOLN
INTRAMUSCULAR | Status: AC
  Filled 2019-01-18: qty 60

## 2019-01-18 MED ORDER — SODIUM CHLORIDE 0.9 % IV SOLN
INTRAVENOUS | Status: DC
  Administered 2019-01-18: 09:00:00 via INTRAVENOUS

## 2019-01-18 MED ORDER — SODIUM CHLORIDE 0.9 % IV SOLN
80.0000 mg | INTRAVENOUS | Status: AC
  Administered 2019-01-18: 80 mg

## 2019-01-18 MED ORDER — LABETALOL HCL 5 MG/ML IV SOLN
INTRAVENOUS | Status: AC
  Filled 2019-01-18: qty 4

## 2019-01-18 MED ORDER — LIDOCAINE HCL (PF) 1 % IJ SOLN
INTRAMUSCULAR | Status: DC | PRN
  Administered 2019-01-18: 60 mL

## 2019-01-18 MED ORDER — LABETALOL HCL 5 MG/ML IV SOLN
INTRAVENOUS | Status: DC | PRN
  Administered 2019-01-18: 20 mg via INTRAVENOUS

## 2019-01-18 SURGICAL SUPPLY — 5 items
CABLE SURGICAL S-101-97-12 (CABLE) ×2 IMPLANT
HEMOSTAT SURGICEL 2X4 FIBR (HEMOSTASIS) ×1 IMPLANT
ICD ASSURA DR CD2357-40C (ICD Generator) ×1 IMPLANT
PAD PRO RADIOLUCENT 2001M-C (PAD) ×2 IMPLANT
TRAY PACEMAKER INSERTION (PACKS) ×2 IMPLANT

## 2019-01-18 NOTE — Discharge Instructions (Signed)
Dermabond will come off in next 10-14 days; if not will remove at wound check  Keep wound dry until tomorrow evening  No Driving x 4 days  Wound check in office as scheduled  

## 2019-01-18 NOTE — Interval H&P Note (Signed)
History and Physical Interval Note:  01/18/2019 9:47 AM  Caleb Lyons  has presented today for surgery, with the diagnosis of eri  The various methods of treatment have been discussed with the patient and family. After consideration of risks, benefits and other options for treatment, the patient has consented to  Procedure(s): ICD [place] (N/A) as a surgical intervention .  The patient's history has been reviewed, patient examined, no change in status, stable for surgery.  I have reviewed the patient's chart and labs.  Questions were answered to the patient's satisfaction.    Revisiting the end of life discussion, he has changed his mind and would like to have his ICD replaced with a ICD  We have reviewed the benefits and risks of generator replacement.  These include but are not limited to lead fracture and infection.  The patient understands, agrees and is willing to proceed.      Virl Axe

## 2019-01-21 MED FILL — Gentamicin Sulfate Inj 40 MG/ML: INTRAMUSCULAR | Qty: 80 | Status: AC

## 2019-01-22 NOTE — Progress Notes (Signed)
Remote ICD transmission.   

## 2019-01-28 ENCOUNTER — Encounter: Payer: Self-pay | Admitting: Internal Medicine

## 2019-01-28 NOTE — Progress Notes (Unsigned)
ICD Criteria  Current LVEF55%. Within 12 months prior to implant: No   Heart failure history: Yes, Class III  Cardiomyopathy history: Yes, Ischemic Cardiomyopathy - Prior MI.  Atrial Fibrillation/Atrial Flutter: No.  Ventricular tachycardia history: Yes, No hemodynamic instability. VT Type: Sustained Ventricular Tachycardia - Monomorphic.  Cardiac arrest history: No.  History of syndromes with risk of sudden death: No.  Previous ICD: Yes, Reason for ICD:  Primary prevention.  Current ICD indication: Secondary  PPM indication: Yes. Pacing type: Both. Greater than 40% RV pacing requirement anticipated. Indication: Sick Sinus Syndrome  Class I or II Bradycardia indication present: Yes  Beta Blocker therapy for 3 or more months: Yes, prescribed.   Ace Inhibitor/ARB therapy for 3 or more months: Yes, prescribed.    I have seen Caleb Lyons is a 85 y.o. malepre-procedural consideration of ICD implant for secondary prevention of sudden death.  The patient's chart has been reviewed and they meet criteria for ICD implant.  I have had a thorough discussion with the patient reviewing options.  The patient and their family (if available) have had opportunities to ask questions and have them answered. The patient and I have decided together through the [place] [employer] to reimplant ICD at this time.  Risks, benefits, alternatives to ICD implantation were discussed in detail with the patient today. The patient  understands that the risks include but are not limited to bleeding, infection, pneumothorax, perforation, tamponade, vascular damage, renal failure, MI, stroke, death, inappropriate shocks, and lead dislodgement and   wishes to proceed.

## 2019-01-29 ENCOUNTER — Ambulatory Visit (INDEPENDENT_AMBULATORY_CARE_PROVIDER_SITE_OTHER): Payer: Medicare Other | Admitting: Nurse Practitioner

## 2019-01-29 DIAGNOSIS — I255 Ischemic cardiomyopathy: Secondary | ICD-10-CM

## 2019-01-29 LAB — CUP PACEART INCLINIC DEVICE CHECK
Battery Remaining Longevity: 96 mo
Brady Statistic RV Percent Paced: 94 %
Date Time Interrogation Session: 20200310114043
HighPow Impedance: 46.4578
Implantable Lead Implant Date: 20020930
Implantable Lead Implant Date: 20110303
Implantable Lead Location: 753858
Implantable Lead Location: 753859
Implantable Lead Model: 7121
Implantable Pulse Generator Implant Date: 20200228
Lead Channel Impedance Value: 375 Ohm
Lead Channel Impedance Value: 375 Ohm
Lead Channel Pacing Threshold Amplitude: 0.625 V
Lead Channel Pacing Threshold Pulse Width: 0.5 ms
Lead Channel Sensing Intrinsic Amplitude: 2.2 mV
Lead Channel Sensing Intrinsic Amplitude: 9.6 mV
Lead Channel Setting Pacing Amplitude: 2.5 V
Lead Channel Setting Pacing Pulse Width: 0.5 ms
MDC IDC SET LEADCHNL RV PACING AMPLITUDE: 0.875
MDC IDC SET LEADCHNL RV SENSING SENSITIVITY: 0.5 mV
MDC IDC STAT BRADY RA PERCENT PACED: 0.23 %
Pulse Gen Serial Number: 9834627

## 2019-01-29 NOTE — Progress Notes (Signed)
Wound check appointment. Dermabond removed. Wound without redness or edema. Incision edges approximated, wound well healed. Normal device function. Thresholds, sensing, and impedances consistent with implant measurements. Histogram distribution appropriate for patient and level of activity. No ventricular arrhythmias noted. Patient educated about wound care, arm mobility, lifting restrictions, shock plan. ROV in 3 months with implanting physician.

## 2019-04-10 DIAGNOSIS — D518 Other vitamin B12 deficiency anemias: Secondary | ICD-10-CM | POA: Diagnosis not present

## 2019-04-10 DIAGNOSIS — R7303 Prediabetes: Secondary | ICD-10-CM | POA: Diagnosis not present

## 2019-04-10 DIAGNOSIS — E038 Other specified hypothyroidism: Secondary | ICD-10-CM | POA: Diagnosis not present

## 2019-04-10 DIAGNOSIS — K219 Gastro-esophageal reflux disease without esophagitis: Secondary | ICD-10-CM | POA: Diagnosis not present

## 2019-04-10 DIAGNOSIS — N401 Enlarged prostate with lower urinary tract symptoms: Secondary | ICD-10-CM | POA: Diagnosis not present

## 2019-04-10 DIAGNOSIS — Z79899 Other long term (current) drug therapy: Secondary | ICD-10-CM | POA: Diagnosis not present

## 2019-04-10 DIAGNOSIS — E559 Vitamin D deficiency, unspecified: Secondary | ICD-10-CM | POA: Diagnosis not present

## 2019-04-10 DIAGNOSIS — Z20828 Contact with and (suspected) exposure to other viral communicable diseases: Secondary | ICD-10-CM | POA: Diagnosis not present

## 2019-04-10 DIAGNOSIS — E782 Mixed hyperlipidemia: Secondary | ICD-10-CM | POA: Diagnosis not present

## 2019-04-10 DIAGNOSIS — I10 Essential (primary) hypertension: Secondary | ICD-10-CM | POA: Diagnosis not present

## 2019-04-16 ENCOUNTER — Ambulatory Visit (INDEPENDENT_AMBULATORY_CARE_PROVIDER_SITE_OTHER): Payer: Medicare Other | Admitting: *Deleted

## 2019-04-16 DIAGNOSIS — I255 Ischemic cardiomyopathy: Secondary | ICD-10-CM

## 2019-04-16 DIAGNOSIS — I5032 Chronic diastolic (congestive) heart failure: Secondary | ICD-10-CM

## 2019-04-17 LAB — CUP PACEART REMOTE DEVICE CHECK
Date Time Interrogation Session: 20200527084523
Implantable Lead Implant Date: 20020930
Implantable Lead Implant Date: 20110303
Implantable Lead Location: 753858
Implantable Lead Location: 753859
Implantable Lead Model: 7121
Implantable Pulse Generator Implant Date: 20200228
Pulse Gen Serial Number: 9834627

## 2019-04-23 ENCOUNTER — Encounter: Payer: Medicare Other | Admitting: Internal Medicine

## 2019-04-29 NOTE — Progress Notes (Signed)
Remote ICD transmission.   

## 2019-04-30 ENCOUNTER — Telehealth: Payer: Self-pay | Admitting: *Deleted

## 2019-04-30 NOTE — Telephone Encounter (Signed)
Virtual Visit Pre-Appointment Phone Call  "(Name), I am calling you today to discuss your upcoming appointment. We are currently trying to limit exposure to the virus that causes COVID-19 by seeing patients at home rather than in the office."  1. "What is the BEST phone number to call the day of the visit?" - include this in appointment notes  2. "Do you have or have access to (through a family member/friend) a smartphone with video capability that we can use for your visit?" a. If yes - list this number in appt notes as "cell" (if different from BEST phone #) and list the appointment type as a VIDEO visit in appointment notes b. If no - list the appointment type as a PHONE visit in appointment notes  3. Confirm consent - "In the setting of the current Covid19 crisis, you are scheduled for a (phone or video) visit with your provider on (date) at (time).  Just as we do with many in-office visits, in order for you to participate in this visit, we must obtain consent.  If you'd like, I can send this to your mychart (if signed up) or email for you to review.  Otherwise, I can obtain your verbal consent now.  All virtual visits are billed to your insurance company just like a normal visit would be.  By agreeing to a virtual visit, we'd like you to understand that the technology does not allow for your provider to perform an examination, and thus may limit your provider's ability to fully assess your condition. If your provider identifies any concerns that need to be evaluated in person, we will make arrangements to do so.  Finally, though the technology is pretty good, we cannot assure that it will always work on either your or our end, and in the setting of a video visit, we may have to convert it to a phone-only visit.  In either situation, we cannot ensure that we have a secure connection.  Are you willing to proceed?" STAFF: Did the patient verbally acknowledge consent to telehealth visit? Document  YES/NO here: yes 4.   5. Advise patient to be prepared - "Two hours prior to your appointment, go ahead and check your blood pressure, pulse, oxygen saturation, and your weight (if you have the equipment to check those) and write them all down. When your visit starts, your provider will ask you for this information. If you have an Apple Watch or Kardia device, please plan to have heart rate information ready on the day of your appointment. Please have a pen and paper handy nearby the day of the visit as well."  6. Give patient instructions for MyChart download to smartphone OR Doximity/Doxy.me as below if video visit (depending on what platform provider is using)  7. Inform patient they will receive a phone call 15 minutes prior to their appointment time (may be from unknown caller ID) so they should be prepared to answer    TELEPHONE CALL NOTE  YOUNG MULVEY has been deemed a candidate for a follow-up tele-health visit to limit community exposure during the Covid-19 pandemic. I spoke with the patient via phone to ensure availability of phone/video source, confirm preferred email & phone number, and discuss instructions and expectations.  I reminded LUDWIG TUGWELL to be prepared with any vital sign and/or heart rhythm information that could potentially be obtained via home monitoring, at the time of his visit. I reminded CHARON AKAMINE to expect a phone call  prior to his visit.  Adrieanna Boteler 04/30/2019 5:31 PM   INSTRUCTIONS FOR DOWNLOADING THE MYCHART APP TO SMARTPHONE  - The patient must first make sure to have activated MyChart and know their login information - If Apple, go to [employer] and type in MyChart in the search bar and download the app. If Android, ask patient to go to [employer] and type in [place] in the search bar and download the app. The app is free but as with any other app downloads, their phone may require them to verify saved payment information or Apple/Android  password.  - The patient will need to then log into the app with their MyChart username and password, and select [facility] as their healthcare provider to link the account. When it is time for your visit, go to the MyChart app, find appointments, and click Begin Video Visit. Be sure to Select Allow for your device to access the Microphone and Camera for your visit. You will then be connected, and your provider will be with you shortly.  **If they have any issues connecting, or need assistance please contact MyChart service desk [phone])**  **If using a computer, in order to ensure the best quality for their visit they will need to use either of the following Internet Browsers: [employer], or Google Chrome**  IF USING DOXIMITY or DOXY.ME - The patient will receive a link just prior to their visit by text.     FULL LENGTH CONSENT FOR TELE-HEALTH VISIT   I hereby voluntarily request, consent and authorize [facility] and its employed or contracted physicians, physician assistants, nurse practitioners or other licensed health care professionals (the Practitioner), to provide me with telemedicine health care services (the "Services") as deemed necessary by the treating Practitioner. I acknowledge and consent to receive the Services by the Practitioner via telemedicine. I understand that the telemedicine visit will involve communicating with the Practitioner through live audiovisual communication technology and the disclosure of certain medical information by electronic transmission. I acknowledge that I have been given the opportunity to request an in-person assessment or other available alternative prior to the telemedicine visit and am voluntarily participating in the telemedicine visit.  I understand that I have the right to withhold or withdraw my consent to the use of telemedicine in the course of my care at any time, without affecting my right to future care or treatment,  and that the Practitioner or I may terminate the telemedicine visit at any time. I understand that I have the right to inspect all information obtained and/or recorded in the course of the telemedicine visit and may receive copies of available information for a reasonable fee.  I understand that some of the potential risks of receiving the Services via telemedicine include:  Marland Kitchen Delay or interruption in medical evaluation due to technological equipment failure or disruption; . Information transmitted may not be sufficient (e.g. poor resolution of images) to allow for appropriate medical decision making by the Practitioner; and/or  . In rare instances, security protocols could fail, causing a breach of personal health information.  Furthermore, I acknowledge that it is my responsibility to provide information about my medical history, conditions and care that is complete and accurate to the best of my ability. I acknowledge that Practitioner's advice, recommendations, and/or decision may be based on factors not within their control, such as incomplete or inaccurate data provided by me or distortions of diagnostic images or specimens that may result from electronic transmissions.  I understand that the practice of medicine is not an exact science and that Practitioner makes no warranties or guarantees regarding treatment outcomes. I acknowledge that I will receive a copy of this consent concurrently upon execution via email to the email address I last provided but may also request a printed copy by calling the office of [facility].    I understand that my insurance will be billed for this visit.   I have read or had this consent read to me. . I understand the contents of this consent, which adequately explains the benefits and risks of the Services being provided via telemedicine.  . I have been provided ample opportunity to ask questions regarding this consent and the Services and have had my questions  answered to my satisfaction. . I give my informed consent for the services to be provided through the use of telemedicine in my medical care  By participating in this telemedicine visit I agree to the above.

## 2019-05-06 ENCOUNTER — Telehealth: Payer: Self-pay | Admitting: *Deleted

## 2019-05-06 ENCOUNTER — Other Ambulatory Visit: Payer: Self-pay

## 2019-05-06 ENCOUNTER — Telehealth (INDEPENDENT_AMBULATORY_CARE_PROVIDER_SITE_OTHER): Payer: Medicare Other | Admitting: Cardiology

## 2019-05-06 DIAGNOSIS — I4819 Other persistent atrial fibrillation: Secondary | ICD-10-CM

## 2019-05-06 NOTE — H&P (View-Only) (Signed)
Electrophysiology TeleHealth Note   Due to national recommendations of social distancing due to COVID 19, an audio/video telehealth visit is felt to be most appropriate for this patient at this time.  See Epic message for the patient's consent to telehealth for [facility].   Date:  05/06/2019   ID:  Caleb Lyons, DOB 1930/10/23, MRN [id number]  Location: patient's home  Provider location: [address] [place]  Evaluation Performed: Follow-up visit  PCP:  Patient, No Pcp Per  Cardiologist:  No primary care provider on file.  Electrophysiologist:  Dr Curt Bears  Chief Complaint:  CHF  History of Present Illness:    Caleb Lyons is a 85 y.o. male who presents via audio/video conferencing for a telehealth visit today.  Since last being seen in our clinic, the patient reports doing very well.  Today, he denies symptoms of palpitations, chest pain, shortness of breath,  lower extremity edema, dizziness, presyncope, or syncope.  The patient is otherwise without complaint today.  The patient denies symptoms of fevers, chills, cough, or new SOB worrisome for COVID 19.  He has a history of complete heart block status post Saint Jude dual-chamber ICD, hypertension, hyperlipidemia, coronary artery disease status post CABG, ischemic cardiomyopathy, OSA, atrial fibrillation, sinus node dysfunction, and type 2 diabetes.  Today, denies symptoms of palpitations, chest pain, shortness of breath, orthopnea, PND, lower extremity edema, claudication, dizziness, presyncope, syncope, bleeding, or neurologic sequela. The patient is tolerating medications without difficulties.  Overall he is feeling well.  He has noted some increased shortness of breath and fatigue since the beginning of May when he went into atrial fibrillation.  He is able to do all of his daily activities, though he is certainly a little bit slower.  Past Medical History:  Diagnosis Date  . Complete heart block ([facility])    . Dual implantable cardiac defibrillator in situ    St Judes  . History of chicken pox   . HLD (hyperlipidemia)   . HTN (hypertension)   . Hyperparathyroidism   . Impotence of organic origin   . Ischemic cardiomyopathy    s/pCABG, Graft patent 3/11; EF 30-35%  . Obesity       . OSA (obstructive sleep apnea)   . PAF (paroxysmal atrial fibrillation) ([place])   . Sinus node dysfunction (HCC)   . Type II or unspecified type diabetes mellitus without mention of complication, not stated as uncontrolled     Past Surgical History:  Procedure Laterality Date  . CORONARY ARTERY BYPASS GRAFT    . ICD GENERATOR CHANGEOUT N/A 01/18/2019   Procedure: ICD GENERATOR CHANGEOUT - Dual Chamber;  Surgeon: Deboraha Sprang, MD;  Location: [facility];  Service: Cardiovascular;  Laterality: N/A;  . TONSILLECTOMY      Current Outpatient Medications  Medication Sig Dispense Refill  . allopurinol (ZYLOPRIM) 100 MG tablet Take 100 mg by mouth every evening.     Marland Kitchen apixaban (ELIQUIS) 5 MG TABS tablet Take 1 tablet (5 mg total) by mouth 2 (two) times daily. 180 tablet 2  . atorvastatin (LIPITOR) 40 MG tablet Take 40 mg by mouth every evening.     . cinacalcet (SENSIPAR) 30 MG tablet Take 1 tablet (30 mg total) by mouth daily with breakfast. (Patient taking differently: Take 30 mg by mouth every evening. ) 90 tablet 0  . COLCRYS 0.6 MG tablet Take 0.6 mg by mouth daily.    . Glucosamine HCl (GLUCOSAMINE PO) Take  1 tablet by mouth 2 (two) times daily.     Marland Kitchen levothyroxine (SYNTHROID, LEVOTHROID) 200 MCG tablet Take 200 mcg by mouth daily before breakfast.    . metoprolol (TOPROL-XL) 50 MG 24 hr tablet Take 50 mg by mouth every evening.     . Misc Natural Products (PROSTATE SUPPORT PO) Take 600 mg by mouth 2 (two) times daily. Super Beta Prostate     . Multiple Vitamin (MULTIVITAMIN WITH MINERALS) TABS tablet Take 1 tablet by mouth daily.    Marland Kitchen omeprazole (PRILOSEC) 20 MG capsule Take 20 mg by mouth daily.     . potassium chloride (K-DUR,KLOR-CON) 10 MEQ tablet Take 10 mEq by mouth 2 (two) times daily.     . valsartan-hydrochlorothiazide (DIOVAN-HCT) 160-12.5 MG tablet Take 1 tablet by mouth daily.    . vitamin C (ASCORBIC ACID) 500 MG tablet Take 500 mg by mouth every evening.      No current facility-administered medications for this visit.     Allergies:   Patient has no known allergies.   Social History:  The patient  reports that he has never smoked. He has never used smokeless tobacco. He reports current alcohol use. He reports that he does not use drugs.   Family History:  The patient's  family history includes Early death in his father; Stroke in his father.   ROS:  Please see the history of present illness.   All other systems are personally reviewed and negative.    Exam:    Vital Signs:  BP 123/82   Pulse 69   Over the phone, no acute distress, no shortness of breath.  Labs/Other Tests and Data Reviewed:    Recent Labs: 01/08/2019: BUN 16; Creatinine, Ser 1.15; Hemoglobin 15.5; Platelets 213; Potassium 3.8; Sodium 141   Wt Readings from Last 3 Encounters:  01/18/19 (!) 400 lb (181.4 kg)  12/24/18 (!) 412 lb (186.9 kg)  11/20/18 (!) 409 lb (185.5 kg)     Other studies personally reviewed: Additional studies/ records that were reviewed today include: ECG 12/24/2018 personally reviewed Review of the above records today demonstrates: Atrial fibrillation, ventricular paced   Last device remote is reviewed from [facility] PDF dated 04/17/19 which reveals normal device function, AF noted   ASSESSMENT & PLAN:    1.  Atrial fibrillation: Currently on Xarelto and metoprolol.  He has been in atrial fibrillation since the beginning of May.  He says that he feels a little bit more weak and fatigued with some shortness of breath.  Due to that, we Miata Culbreth plan for cardioversion.  This patients CHA2DS2-VASc Score and unadjusted Ischemic Stroke Rate (% per year) is equal to 9.7 % stroke  rate/year from a score of 6  Above score calculated as 1 point each if present [CHF, HTN, DM, Vascular=MI/PAD/Aortic Plaque, Age if 65-74, or Male] Above score calculated as 2 points each if present [Age > 75, or Stroke/TIA/TE]  2.  Coronary artery disease status post CABG: No current chest pain  3.  Ischemic cardiomyopathy: Ejection fraction improved on most recent echo in 2017  4.  Hypertension: Blood pressure is well controlled.  No changes  COVID 19 screen The patient denies symptoms of COVID 19 at this time.  The importance of social distancing was discussed today.  Follow-up: 3 months  Current medicines are reviewed at length with the patient today.   The patient does not have concerns regarding his medicines.  The following changes were made today:  none  Labs/  tests ordered today include:  No orders of the defined types were placed in this encounter.    Patient Risk:  after full review of this patients clinical status, I feel that they are at moderate risk at this time.  Today, I have spent 11 minutes with the patient with telehealth technology discussing atrial fibrillation.    Signed, Alonni Heimsoth Meredith Leeds, MD  05/06/2019 9:48 AM     [facility] [address] [place] [address] [phone] (office) [phone] (fax)

## 2019-05-06 NOTE — Progress Notes (Signed)
Electrophysiology TeleHealth Note   Due to national recommendations of social distancing due to COVID 19, an audio/video telehealth visit is felt to be most appropriate for this patient at this time.  See Epic message for the patient's consent to telehealth for [facility].   Date:  05/06/2019   ID:  Caleb Lyons, DOB 1930/07/24, MRN [id number]  Location: patient's home  Provider location: [address], [place]  Evaluation Performed: Follow-up visit  PCP:  Patient, No Pcp Per  Cardiologist:  No primary care provider on file.  Electrophysiologist:  Dr Curt Bears  Chief Complaint:  CHF  History of Present Illness:    Caleb Lyons is a 85 y.o. male who presents via audio/video conferencing for a telehealth visit today.  Since last being seen in our clinic, the patient reports doing very well.  Today, he denies symptoms of palpitations, chest pain, shortness of breath,  lower extremity edema, dizziness, presyncope, or syncope.  The patient is otherwise without complaint today.  The patient denies symptoms of fevers, chills, cough, or new SOB worrisome for COVID 19.  He has a history of complete heart block status post Saint Jude dual-chamber ICD, hypertension, hyperlipidemia, coronary artery disease status post CABG, ischemic cardiomyopathy, OSA, atrial fibrillation, sinus node dysfunction, and type 2 diabetes.  Today, denies symptoms of palpitations, chest pain, shortness of breath, orthopnea, PND, lower extremity edema, claudication, dizziness, presyncope, syncope, bleeding, or neurologic sequela. The patient is tolerating medications without difficulties.  Overall he is feeling well.  He has noted some increased shortness of breath and fatigue since the beginning of May when he went into atrial fibrillation.  He is able to do all of his daily activities, though he is certainly a little bit slower.  Past Medical History:  Diagnosis Date  . Complete heart block ([place])    . Dual implantable cardiac defibrillator in situ    St Judes  . History of chicken pox   . HLD (hyperlipidemia)   . HTN (hypertension)   . Hyperparathyroidism   . Impotence of organic origin   . Ischemic cardiomyopathy    s/pCABG, Graft patent 3/11; EF 30-35%  . Obesity       . OSA (obstructive sleep apnea)   . PAF (paroxysmal atrial fibrillation) ([place])   . Sinus node dysfunction (HCC)   . Type II or unspecified type diabetes mellitus without mention of complication, not stated as uncontrolled     Past Surgical History:  Procedure Laterality Date  . CORONARY ARTERY BYPASS GRAFT    . ICD GENERATOR CHANGEOUT N/A 01/18/2019   Procedure: ICD GENERATOR CHANGEOUT - Dual Chamber;  Surgeon: Deboraha Sprang, MD;  Location: [facility];  Service: Cardiovascular;  Laterality: N/A;  . TONSILLECTOMY      Current Outpatient Medications  Medication Sig Dispense Refill  . allopurinol (ZYLOPRIM) 100 MG tablet Take 100 mg by mouth every evening.     Marland Kitchen apixaban (ELIQUIS) 5 MG TABS tablet Take 1 tablet (5 mg total) by mouth 2 (two) times daily. 180 tablet 2  . atorvastatin (LIPITOR) 40 MG tablet Take 40 mg by mouth every evening.     . cinacalcet (SENSIPAR) 30 MG tablet Take 1 tablet (30 mg total) by mouth daily with breakfast. (Patient taking differently: Take 30 mg by mouth every evening. ) 90 tablet 0  . COLCRYS 0.6 MG tablet Take 0.6 mg by mouth daily.    . Glucosamine HCl (GLUCOSAMINE PO) Take  1 tablet by mouth 2 (two) times daily.     Marland Kitchen levothyroxine (SYNTHROID, LEVOTHROID) 200 MCG tablet Take 200 mcg by mouth daily before breakfast.    . metoprolol (TOPROL-XL) 50 MG 24 hr tablet Take 50 mg by mouth every evening.     . Misc Natural Products (PROSTATE SUPPORT PO) Take 600 mg by mouth 2 (two) times daily. Super Beta Prostate     . Multiple Vitamin (MULTIVITAMIN WITH MINERALS) TABS tablet Take 1 tablet by mouth daily.    Marland Kitchen omeprazole (PRILOSEC) 20 MG capsule Take 20 mg by mouth daily.     . potassium chloride (K-DUR,KLOR-CON) 10 MEQ tablet Take 10 mEq by mouth 2 (two) times daily.     . valsartan-hydrochlorothiazide (DIOVAN-HCT) 160-12.5 MG tablet Take 1 tablet by mouth daily.    . vitamin C (ASCORBIC ACID) 500 MG tablet Take 500 mg by mouth every evening.      No current facility-administered medications for this visit.     Allergies:   Patient has no known allergies.   Social History:  The patient  reports that he has never smoked. He has never used smokeless tobacco. He reports current alcohol use. He reports that he does not use drugs.   Family History:  The patient's  family history includes Early death in his father; Stroke in his father.   ROS:  Please see the history of present illness.   All other systems are personally reviewed and negative.    Exam:    Vital Signs:  BP 123/82   Pulse 69   Over the phone, no acute distress, no shortness of breath.  Labs/Other Tests and Data Reviewed:    Recent Labs: 01/08/2019: BUN 16; Creatinine, Ser 1.15; Hemoglobin 15.5; Platelets 213; Potassium 3.8; Sodium 141   Wt Readings from Last 3 Encounters:  01/18/19 (!) 400 lb (181.4 kg)  12/24/18 (!) 412 lb (186.9 kg)  11/20/18 (!) 409 lb (185.5 kg)     Other studies personally reviewed: Additional studies/ records that were reviewed today include: ECG 12/24/2018 personally reviewed Review of the above records today demonstrates: Atrial fibrillation, ventricular paced   Last device remote is reviewed from [place] PDF dated 04/17/19 which reveals normal device function, AF noted   ASSESSMENT & PLAN:    1.  Atrial fibrillation: Currently on Xarelto and metoprolol.  He has been in atrial fibrillation since the beginning of May.  He says that he feels a little bit more weak and fatigued with some shortness of breath.  Due to that, we [name] plan for cardioversion.  This patients CHA2DS2-VASc Score and unadjusted Ischemic Stroke Rate (% per year) is equal to 9.7 % stroke  rate/year from a score of 6  Above score calculated as 1 point each if present [CHF, HTN, DM, Vascular=MI/PAD/Aortic Plaque, Age if 65-74, or Male] Above score calculated as 2 points each if present [Age > 75, or Stroke/TIA/TE]  2.  Coronary artery disease status post CABG: No current chest pain  3.  Ischemic cardiomyopathy: Ejection fraction improved on most recent echo in 2017  4.  Hypertension: Blood pressure is well controlled.  No changes  COVID 19 screen The patient denies symptoms of COVID 19 at this time.  The importance of social distancing was discussed today.  Follow-up: 3 months  Current medicines are reviewed at length with the patient today.   The patient does not have concerns regarding his medicines.  The following changes were made today:  none  Labs/  tests ordered today include:  No orders of the defined types were placed in this encounter.    Patient Risk:  after full review of this patients clinical status, I feel that they are at moderate risk at this time.  Today, I have spent 11 minutes with the patient with telehealth technology discussing atrial fibrillation.    Signed, Lukka Black Meredith Leeds, MD  05/06/2019 9:48 AM     [facility] [address] [place] [address] [phone] (office) [phone] (fax)

## 2019-05-06 NOTE — Telephone Encounter (Signed)
Scheduled DCCV for 6/22. Procedure instructions reviewed w/ pt COVID screening scheduled for Friday. Screening instructions reviewed w/ pt. F/U scheduled for 7/28. Patient verbalized understanding and agreeable to plan.

## 2019-05-10 ENCOUNTER — Other Ambulatory Visit (HOSPITAL_COMMUNITY)
Admission: RE | Admit: 2019-05-10 | Discharge: 2019-05-10 | Disposition: A | Discharge status: Home / Self Care | Payer: Medicare Other | Source: Ambulatory Visit | Attending: Internal Medicine | Admitting: Internal Medicine

## 2019-05-10 DIAGNOSIS — Z1159 Encounter for screening for other viral diseases: Secondary | ICD-10-CM | POA: Insufficient documentation

## 2019-05-10 LAB — SARS CORONAVIRUS 2 (TAT 6-24 HRS): SARS Coronavirus 2: NEGATIVE

## 2019-05-13 ENCOUNTER — Ambulatory Visit (HOSPITAL_COMMUNITY)
Admission: RE | Admit: 2019-05-13 | Discharge: 2019-05-13 | Disposition: A | Discharge status: Home / Self Care | Payer: Medicare Other | Attending: Internal Medicine | Admitting: Internal Medicine

## 2019-05-13 ENCOUNTER — Ambulatory Visit (HOSPITAL_COMMUNITY): Payer: Medicare Other | Admitting: Certified Registered Nurse Anesthetist

## 2019-05-13 ENCOUNTER — Other Ambulatory Visit: Payer: Self-pay

## 2019-05-13 ENCOUNTER — Encounter (HOSPITAL_COMMUNITY): Payer: Self-pay

## 2019-05-13 ENCOUNTER — Telehealth: Payer: Self-pay | Admitting: Internal Medicine

## 2019-05-13 ENCOUNTER — Encounter (HOSPITAL_COMMUNITY)
Admission: RE | Disposition: A | Discharge status: Home / Self Care | Payer: Self-pay | Source: Home / Self Care | Attending: Internal Medicine

## 2019-05-13 DIAGNOSIS — E119 Type 2 diabetes mellitus without complications: Secondary | ICD-10-CM | POA: Diagnosis not present

## 2019-05-13 DIAGNOSIS — E669 Obesity, unspecified: Secondary | ICD-10-CM | POA: Insufficient documentation

## 2019-05-13 DIAGNOSIS — I442 Atrioventricular block, complete: Secondary | ICD-10-CM | POA: Insufficient documentation

## 2019-05-13 DIAGNOSIS — Z95810 Presence of automatic (implantable) cardiac defibrillator: Secondary | ICD-10-CM | POA: Diagnosis not present

## 2019-05-13 DIAGNOSIS — I480 Paroxysmal atrial fibrillation: Secondary | ICD-10-CM | POA: Insufficient documentation

## 2019-05-13 DIAGNOSIS — I2510 Atherosclerotic heart disease of native coronary artery without angina pectoris: Secondary | ICD-10-CM | POA: Insufficient documentation

## 2019-05-13 DIAGNOSIS — E875 Hyperkalemia: Secondary | ICD-10-CM

## 2019-05-13 DIAGNOSIS — E213 Hyperparathyroidism, unspecified: Secondary | ICD-10-CM | POA: Insufficient documentation

## 2019-05-13 DIAGNOSIS — I4891 Unspecified atrial fibrillation: Secondary | ICD-10-CM | POA: Diagnosis not present

## 2019-05-13 DIAGNOSIS — I255 Ischemic cardiomyopathy: Secondary | ICD-10-CM | POA: Insufficient documentation

## 2019-05-13 DIAGNOSIS — G4733 Obstructive sleep apnea (adult) (pediatric): Secondary | ICD-10-CM | POA: Diagnosis not present

## 2019-05-13 DIAGNOSIS — I495 Sick sinus syndrome: Secondary | ICD-10-CM | POA: Insufficient documentation

## 2019-05-13 DIAGNOSIS — Z79890 Hormone replacement therapy (postmenopausal): Secondary | ICD-10-CM | POA: Insufficient documentation

## 2019-05-13 DIAGNOSIS — Z7901 Long term (current) use of anticoagulants: Secondary | ICD-10-CM | POA: Insufficient documentation

## 2019-05-13 DIAGNOSIS — E785 Hyperlipidemia, unspecified: Secondary | ICD-10-CM | POA: Diagnosis not present

## 2019-05-13 DIAGNOSIS — I5032 Chronic diastolic (congestive) heart failure: Secondary | ICD-10-CM | POA: Diagnosis not present

## 2019-05-13 DIAGNOSIS — I6523 Occlusion and stenosis of bilateral carotid arteries: Secondary | ICD-10-CM | POA: Diagnosis not present

## 2019-05-13 DIAGNOSIS — Z8673 Personal history of transient ischemic attack (TIA), and cerebral infarction without residual deficits: Secondary | ICD-10-CM | POA: Insufficient documentation

## 2019-05-13 DIAGNOSIS — I110 Hypertensive heart disease with heart failure: Secondary | ICD-10-CM | POA: Diagnosis not present

## 2019-05-13 DIAGNOSIS — Z951 Presence of aortocoronary bypass graft: Secondary | ICD-10-CM | POA: Insufficient documentation

## 2019-05-13 DIAGNOSIS — Z79899 Other long term (current) drug therapy: Secondary | ICD-10-CM | POA: Diagnosis not present

## 2019-05-13 HISTORY — PX: CARDIOVERSION: SHX1299

## 2019-05-13 LAB — BASIC METABOLIC PANEL
Anion gap: 15 (ref 5–15)
Anion gap: 9 (ref 5–15)
BUN: 17 mg/dL (ref 8–23)
BUN: 15 mg/dL (ref 8–23)
CO2: 20 mmol/L — ABNORMAL LOW (ref 22–32)
CO2: 27 mmol/L (ref 22–32)
Calcium: 9.7 mg/dL (ref 8.9–10.3)
Calcium: 9.8 mg/dL (ref 8.9–10.3)
Chloride: 105 mmol/L (ref 98–111)
Chloride: 105 mmol/L (ref 98–111)
Creatinine, Ser: 1.16 mg/dL (ref 0.61–1.24)
Creatinine, Ser: 1.12 mg/dL (ref 0.61–1.24)
GFR calc Af Amer: >60 mL/min (ref >60)
GFR calc Af Amer: >60 mL/min (ref >60)
GFR calc non Af Amer: 57 mL/min — ABNORMAL LOW (ref >60)
GFR calc non Af Amer: 59 mL/min — ABNORMAL LOW (ref >60)
Glucose, Bld: 143 mg/dL — ABNORMAL HIGH (ref 70–99)
Glucose, Bld: 139 mg/dL — ABNORMAL HIGH (ref 70–99)
Potassium: 5.9 mmol/L — ABNORMAL HIGH (ref 3.5–5.1)
Potassium: 4.3 mmol/L (ref 3.5–5.1)
Sodium: 140 mmol/L (ref 135–145)
Sodium: 141 mmol/L (ref 135–145)

## 2019-05-13 LAB — CBC WITH DIFFERENTIAL/PLATELET
Abs Immature Granulocytes: 0.04 10*3/uL (ref 0.00–0.07)
Basophils Absolute: 0.1 10*3/uL (ref 0.0–0.1)
Basophils Relative: 1 %
Eosinophils Absolute: 0.4 10*3/uL (ref 0.0–0.5)
Eosinophils Relative: 4 %
HCT: 50.2 % (ref 39.0–52.0)
Hemoglobin: 16.4 g/dL (ref 13.0–17.0)
Immature Granulocytes: 1 %
Lymphocytes Relative: 22 %
Lymphs Abs: 1.8 10*3/uL (ref 0.7–4.0)
MCH: 32.5 pg (ref 26.0–34.0)
MCHC: 32.7 g/dL (ref 30.0–36.0)
MCV: 99.4 fL (ref 80.0–100.0)
Monocytes Absolute: 0.7 10*3/uL (ref 0.1–1.0)
Monocytes Relative: 9 %
Neutro Abs: 5.4 10*3/uL (ref 1.7–7.7)
Neutrophils Relative %: 63 %
Platelets: 179 10*3/uL (ref 150–400)
RBC: 5.05 MIL/uL (ref 4.22–5.81)
RDW: 13.7 % (ref 11.5–15.5)
WBC: 8.5 10*3/uL (ref 4.0–10.5)
nRBC: 0 % (ref 0.0–0.2)

## 2019-05-13 SURGERY — CARDIOVERSION
Anesthesia: General

## 2019-05-13 MED ORDER — LIDOCAINE 2% (20 MG/ML) 5 ML SYRINGE
INTRAMUSCULAR | Status: DC | PRN
  Administered 2019-05-13: 60 mg via INTRAVENOUS

## 2019-05-13 MED ORDER — PROPOFOL 10 MG/ML IV BOLUS
INTRAVENOUS | Status: DC | PRN
  Administered 2019-05-13: 60 mg via INTRAVENOUS

## 2019-05-13 MED ORDER — SODIUM CHLORIDE 0.9 % IV SOLN
INTRAVENOUS | Status: DC | PRN
  Administered 2019-05-13: 11:00:00 via INTRAVENOUS

## 2019-05-13 NOTE — Interval H&P Note (Signed)
History and Physical Interval Note:  05/13/2019 9:23 AM  Caleb Lyons  has presented today for surgery, with the diagnosis of A-FIB.  The various methods of treatment have been discussed with the patient and family. After consideration of risks, benefits and other options for treatment, the patient has consented to  Procedure(s): CARDIOVERSION (N/A) as a surgical intervention.  The patient's history has been reviewed, patient examined, no change in status, stable for surgery.  I have reviewed the patient's chart and labs.  Questions were answered to the patient's satisfaction.     Dorris Carnes

## 2019-05-13 NOTE — Anesthesia Procedure Notes (Signed)
Procedure Name: General with mask airway Date/Time: 05/13/2019 11:07 AM Performed by: Alain Marion, CRNA Pre-anesthesia Checklist: Patient identified, Emergency Drugs available, Suction available, Patient being monitored and Timeout performed Patient Re-evaluated:Patient Re-evaluated prior to induction Oxygen Delivery Method: Ambu bag Preoxygenation: Pre-oxygenation with 100% oxygen Induction Type: IV induction Placement Confirmation: positive ETCO2

## 2019-05-13 NOTE — Anesthesia Preprocedure Evaluation (Signed)
Anesthesia Evaluation  Patient identified by MRN, date of birth, ID band Patient awake    Reviewed: Allergy & Precautions, H&P , NPO status , Patient's Chart, lab work & pertinent test results  Airway Mallampati: II   Neck ROM: full    Dental   Pulmonary sleep apnea ,    breath sounds clear to auscultation       Cardiovascular hypertension, + angina + CABG, + Peripheral Vascular Disease and +CHF  + dysrhythmias Atrial Fibrillation + Cardiac Defibrillator  Rhythm:irregular Rate:Normal     Neuro/Psych  Neuromuscular disease CVA    GI/Hepatic   Endo/Other  diabetes, Type 2  Renal/GU      Musculoskeletal   Abdominal   Peds  Hematology   Anesthesia Other Findings   Reproductive/Obstetrics                             Anesthesia Physical Anesthesia Plan  ASA: IV  Anesthesia Plan: General   Post-op Pain Management:    Induction: Intravenous  PONV Risk Score and Plan: 2 and Treatment may vary due to age or medical condition and Propofol infusion  Airway Management Planned: Mask  Additional Equipment:   Intra-op Plan:   Post-operative Plan:   Informed Consent: I have reviewed the patients History and Physical, chart, labs and discussed the procedure including the risks, benefits and alternatives for the proposed anesthesia with the patient or authorized representative who has indicated his/her understanding and acceptance.       Plan Discussed with: CRNA, Anesthesiologist and Surgeon  Anesthesia Plan Comments:         Anesthesia Quick Evaluation

## 2019-05-13 NOTE — CV Procedure (Signed)
Cardioversion  Patient sedated by anesthesia with lidocaine and propofol  With pads in apex / base position, patient converted to SR with 200 J synchronized biphasic energy  Device interrogation pending  Procedure was without complication  Will repeat K level  Patient told to stop K supplement.  Will recheck BMET this week.  Dorris Carnes MD

## 2019-05-13 NOTE — Transfer of Care (Signed)
Immediate Anesthesia Transfer of Care Note  Patient: Caleb Lyons  Procedure(s) Performed: CARDIOVERSION (N/A )  Patient Location: Endoscopy Unit   Anesthesia Type:MAC  Level of Consciousness: awake, alert  and oriented  Airway & Oxygen Therapy: Patient Spontanous Breathing  Post-op Assessment: Report given to RN and Post -op Vital signs reviewed and stable  Post vital signs: Reviewed and stable  Last Vitals:  Vitals Value Taken Time  BP 160/88 05/13/19 1116  Temp    Pulse 78 05/13/19 1116  Resp 15 05/13/19 1116  SpO2 92 % 05/13/19 1116    Last Pain:  Vitals:   05/13/19 0855  TempSrc: Oral  PainSc: 0-No pain         Complications: No apparent anesthesia complications

## 2019-05-13 NOTE — Telephone Encounter (Signed)
Left message informing pt I would call at later time to arrange BMET in [place] office.

## 2019-05-13 NOTE — Telephone Encounter (Signed)
Patient had cardioversion today  BMET on arrival   K 5.9     Istat K greater than 8   (hemolyzed) Repeat BMET pending     Pt cardioverted wthout difficulty to SR  Told to stop K supplements (taking 10 bid)   He admits last week to eating a lot of bananas  Needs repeat BMET on Thursday     (he is also on a ARB/HCTZ combo)  Needs to get at [facility]   Please arrange/call pt with time    Confirm he has a f/u appt.

## 2019-05-14 NOTE — Telephone Encounter (Signed)
Pt agreeable to follow up lab work next week. He will stop by the office in [place] for blood work on Monday/Tuesday.  Instructions for staying in car and calling office when he gets there reviewed with patient. Patient verbalized understanding and agreeable to plan.

## 2019-05-14 NOTE — Addendum Note (Signed)
Addended by: Stanton Kidney on: 05/14/2019 01:05 PM   Modules accepted: Orders

## 2019-05-15 NOTE — Anesthesia Postprocedure Evaluation (Signed)
Anesthesia Post Note  Patient: Caleb Lyons  Procedure(s) Performed: CARDIOVERSION (N/A )     Patient location during evaluation: Endoscopy Anesthesia Type: General Level of consciousness: awake and alert Pain management: pain level controlled Vital Signs Assessment: post-procedure vital signs reviewed and stable Respiratory status: spontaneous breathing, nonlabored ventilation, respiratory function stable and patient connected to nasal cannula oxygen Cardiovascular status: blood pressure returned to baseline and stable Postop Assessment: no apparent nausea or vomiting Anesthetic complications: no    Last Vitals:  Vitals:   05/13/19 1120 05/13/19 1130  BP: (!) 146/67 (!) 164/97  Pulse: 72 76  Resp: 10 (!) 22  Temp:    SpO2: 96% 94%    Last Pain:  Vitals:   05/14/19 0935  TempSrc:   PainSc: 0-No pain                 Lilliauna Van S

## 2019-05-16 ENCOUNTER — Encounter (HOSPITAL_COMMUNITY): Payer: Self-pay | Admitting: Internal Medicine

## 2019-05-21 ENCOUNTER — Other Ambulatory Visit: Payer: Self-pay | Admitting: Emergency Medicine

## 2019-05-21 DIAGNOSIS — E875 Hyperkalemia: Secondary | ICD-10-CM

## 2019-05-21 LAB — BASIC METABOLIC PANEL
BUN/Creatinine Ratio: 11 (ref 10–24)
BUN: 12 mg/dL (ref 8–27)
CO2: 25 mmol/L (ref 20–29)
Calcium: 10.2 mg/dL (ref 8.6–10.2)
Chloride: 98 mmol/L (ref 96–106)
Creatinine, Ser: 1.09 mg/dL (ref 0.76–1.27)
GFR calc Af Amer: 71 mL/min/{1.73_m2} (ref >59)
GFR calc non Af Amer: 61 mL/min/{1.73_m2} (ref >59)
Glucose: 162 mg/dL — ABNORMAL HIGH (ref 65–99)
Potassium: 4.1 mmol/L (ref 3.5–5.2)
Sodium: 141 mmol/L (ref 134–144)

## 2019-06-18 ENCOUNTER — Encounter: Payer: Medicare Other | Admitting: Cardiology

## 2019-06-26 DIAGNOSIS — N3281 Overactive bladder: Secondary | ICD-10-CM | POA: Diagnosis not present

## 2019-06-26 DIAGNOSIS — N401 Enlarged prostate with lower urinary tract symptoms: Secondary | ICD-10-CM | POA: Diagnosis not present

## 2019-07-17 ENCOUNTER — Ambulatory Visit (INDEPENDENT_AMBULATORY_CARE_PROVIDER_SITE_OTHER): Payer: Medicare Other | Admitting: *Deleted

## 2019-07-17 DIAGNOSIS — I442 Atrioventricular block, complete: Secondary | ICD-10-CM

## 2019-07-17 DIAGNOSIS — I255 Ischemic cardiomyopathy: Secondary | ICD-10-CM

## 2019-07-17 LAB — CUP PACEART REMOTE DEVICE CHECK
Battery Remaining Longevity: 88 mo
Battery Remaining Percentage: 89 %
Battery Voltage: 3.11 V
Brady Statistic AP VP Percent: 17 %
Brady Statistic AP VS Percent: 1 %
Brady Statistic AS VP Percent: 74 %
Brady Statistic AS VS Percent: 5.3 %
Brady Statistic RA Percent Paced: 7.9 %
Brady Statistic RV Percent Paced: 90 %
Date Time Interrogation Session: 20200825060015
HighPow Impedance: 52 Ohm
HighPow Impedance: 52 Ohm
Implantable Lead Implant Date: 20020930
Implantable Lead Implant Date: 20110303
Implantable Lead Location: 753858
Implantable Lead Location: 753859
Implantable Lead Model: 7121
Implantable Pulse Generator Implant Date: 20200228
Lead Channel Impedance Value: 380 Ohm
Lead Channel Impedance Value: 400 Ohm
Lead Channel Pacing Threshold Amplitude: 0.625 V
Lead Channel Pacing Threshold Pulse Width: 0.5 ms
Lead Channel Sensing Intrinsic Amplitude: 12 mV
Lead Channel Sensing Intrinsic Amplitude: 4.7 mV
Lead Channel Setting Pacing Amplitude: 0.875
Lead Channel Setting Pacing Amplitude: 2.5 V
Lead Channel Setting Pacing Pulse Width: 0.5 ms
Lead Channel Setting Sensing Sensitivity: 0.5 mV
Pulse Gen Serial Number: 9834627

## 2019-07-18 DIAGNOSIS — Z03818 Encounter for observation for suspected exposure to other biological agents ruled out: Secondary | ICD-10-CM | POA: Diagnosis not present

## 2019-07-18 DIAGNOSIS — R609 Edema, unspecified: Secondary | ICD-10-CM | POA: Diagnosis not present

## 2019-07-18 DIAGNOSIS — R0902 Hypoxemia: Secondary | ICD-10-CM | POA: Diagnosis not present

## 2019-07-18 DIAGNOSIS — R52 Pain, unspecified: Secondary | ICD-10-CM | POA: Diagnosis not present

## 2019-07-18 DIAGNOSIS — R531 Weakness: Secondary | ICD-10-CM | POA: Diagnosis not present

## 2019-07-26 NOTE — Progress Notes (Signed)
Remote ICD transmission.   

## 2019-08-05 ENCOUNTER — Ambulatory Visit (INDEPENDENT_AMBULATORY_CARE_PROVIDER_SITE_OTHER): Payer: Medicare Other | Admitting: Cardiology

## 2019-08-05 ENCOUNTER — Other Ambulatory Visit: Payer: Self-pay

## 2019-08-05 ENCOUNTER — Encounter: Payer: Self-pay | Admitting: Cardiology

## 2019-08-05 VITALS — BP 132/88 | HR 81 | Ht 73.0 in | Wt >= 6400 oz

## 2019-08-05 DIAGNOSIS — Z95810 Presence of automatic (implantable) cardiac defibrillator: Secondary | ICD-10-CM | POA: Diagnosis not present

## 2019-08-05 DIAGNOSIS — I5032 Chronic diastolic (congestive) heart failure: Secondary | ICD-10-CM | POA: Diagnosis not present

## 2019-08-05 DIAGNOSIS — I255 Ischemic cardiomyopathy: Secondary | ICD-10-CM

## 2019-08-05 DIAGNOSIS — I4819 Other persistent atrial fibrillation: Secondary | ICD-10-CM | POA: Diagnosis not present

## 2019-08-05 DIAGNOSIS — I442 Atrioventricular block, complete: Secondary | ICD-10-CM

## 2019-08-05 LAB — CUP PACEART INCLINIC DEVICE CHECK
Battery Remaining Longevity: 91 mo
Brady Statistic RA Percent Paced: 7.6 %
Brady Statistic RV Percent Paced: 90 %
Date Time Interrogation Session: 20200914151250
HighPow Impedance: 51 Ohm
Implantable Lead Implant Date: 20020930
Implantable Lead Implant Date: 20110303
Implantable Lead Location: 753859
Implantable Lead Location: 753860
Implantable Lead Model: 7121
Implantable Pulse Generator Implant Date: 20200228
Lead Channel Impedance Value: 400 Ohm
Lead Channel Impedance Value: 412.5 Ohm
Lead Channel Pacing Threshold Amplitude: 0.625 V
Lead Channel Pacing Threshold Amplitude: 0.75 V
Lead Channel Pacing Threshold Pulse Width: 0.5 ms
Lead Channel Pacing Threshold Pulse Width: 0.5 ms
Lead Channel Sensing Intrinsic Amplitude: 12 mV
Lead Channel Sensing Intrinsic Amplitude: 4.7 mV
Lead Channel Setting Pacing Amplitude: 0.875
Lead Channel Setting Pacing Amplitude: 2 V
Lead Channel Setting Pacing Pulse Width: 0.5 ms
Lead Channel Setting Sensing Sensitivity: 0.5 mV
Pulse Gen Serial Number: 9834627

## 2019-08-05 NOTE — Patient Instructions (Signed)
Medication Instructions:  Your physician recommends that you continue on your current medications as directed. Please refer to the Current Medication list given to you today.  *If you need a refill on your cardiac medications before your next appointment, please call your pharmacy*  Labwork: None ordered If you have labs (blood work) drawn today and your tests are completely normal, you will receive your results only by:  [place] (if you have MyChart) OR  A paper copy in the mail If you have any lab test that is abnormal or we need to change your treatment, we will call you to review the results.  Testing/Procedures: None ordered  Follow-Up: Remote monitoring is used to monitor your Pacemaker or ICD from home. This monitoring reduces the number of office visits required to check your device to one time per year. It allows [place] to keep an eye on the functioning of your device to ensure it is working properly. You are scheduled for a device check from home on 10/16/19. You may send your transmission at any time that day. If you have a wireless device, the transmission will be sent automatically. After your physician reviews your transmission, you will receive a postcard with your next transmission date.  Your physician wants you to follow-up in: 1 year with Dr. Curt Bears.  You will receive a reminder letter in the mail two months in advance. If you don't receive a letter, please call our office to schedule the follow-up appointment.  Thank you for choosing [facility]!!   Trinidad Curet, RN [phone]  Any Other Special Instructions Will Be Listed Below (If Applicable).

## 2019-08-05 NOTE — Progress Notes (Signed)
Electrophysiology Office Note   Date:  08/05/2019   ID:  Caleb Lyons, DOB 1930-07-02, MRN [id number]  PCP:  Patient, No Pcp Per  Cardiologist:   Primary Electrophysiologist:   Meredith Leeds, MD    Chief Complaint: ICD   History of Present Illness: Caleb Lyons is a 85 y.o. male who is being seen today for the evaluation of ICD at the request of No ref. provider found. Presenting today for electrophysiology evaluation.  Is a history of complete heart block status post Saint Jude dual-chamber ICD, hypertension, hyperlipidemia, coronary artery disease status post CABG, ischemic cardiomyopathy, OSA, atrial fibrillation, sinus node dysfunction, and type 2 diabetes.  He had a cardioversion 05/13/2019 for atrial fibrillation.  He remains in sinus rhythm since his cardioversion and is felt well.  Today, he denies symptoms of palpitations, chest pain, shortness of breath, orthopnea, PND, lower extremity edema, claudication, dizziness, presyncope, syncope, bleeding, or neurologic sequela. The patient is tolerating medications without difficulties.    Past Medical History:  Diagnosis Date  . Complete heart block ([place])   . Dual implantable cardiac defibrillator in situ    St Judes  . History of chicken pox   . HLD (hyperlipidemia)   . HTN (hypertension)   . Hyperparathyroidism   . Impotence of organic origin   . Ischemic cardiomyopathy    s/pCABG, Graft patent 3/11; EF 30-35%  . Obesity       . OSA (obstructive sleep apnea)   . PAF (paroxysmal atrial fibrillation) ([place])   . Sinus node dysfunction ([facility])   . Type II or unspecified type diabetes mellitus without mention of complication, not stated as uncontrolled    Past Surgical History:  Procedure Laterality Date  . CARDIOVERSION N/A 05/13/2019   Procedure: CARDIOVERSION;  Surgeon: Fay Records, MD;  [facility] ENDOSCOPY;  Service: Cardiovascular;  Laterality: N/A;  . CORONARY ARTERY BYPASS GRAFT    . ICD GENERATOR  CHANGEOUT N/A 01/18/2019   Procedure: ICD GENERATOR CHANGEOUT - Dual Chamber;  Surgeon: Deboraha Sprang, MD;  Location: [place] CV LAB;  Service: Cardiovascular;  Laterality: N/A;  . TONSILLECTOMY       Current Outpatient Medications  Medication Sig Dispense Refill  . allopurinol (ZYLOPRIM) 100 MG tablet Take 100 mg by mouth every evening.     Marland Kitchen apixaban (ELIQUIS) 5 MG TABS tablet Take 1 tablet (5 mg total) by mouth 2 (two) times daily. 180 tablet 2  . atorvastatin (LIPITOR) 40 MG tablet Take 40 mg by mouth every evening.     . cinacalcet (SENSIPAR) 30 MG tablet Take 1 tablet (30 mg total) by mouth daily with breakfast. 90 tablet 0  . COLCRYS 0.6 MG tablet Take 0.6 mg by mouth daily.    Marland Kitchen GLUCOSAMINE-CHONDROITIN PO Take 1 tablet by mouth 2 (two) times a day.    . levothyroxine (SYNTHROID, LEVOTHROID) 200 MCG tablet Take 200 mcg by mouth daily before breakfast.    . Menthol, Topical Analgesic, (BENGAY EX) Apply 1 application topically daily as needed (pain).    . metoprolol (TOPROL-XL) 50 MG 24 hr tablet Take 50 mg by mouth every evening.     . Misc Natural Products (PROSTATE SUPPORT PO) Take 600 mg by mouth 2 (two) times daily. Super Beta Prostate     . Multiple Vitamin (MULTIVITAMIN WITH MINERALS) TABS tablet Take 1 tablet by mouth daily.    Marland Kitchen omeprazole (PRILOSEC) 20 MG capsule Take 20 mg by mouth daily.    Marland Kitchen  oxymetazoline (AFRIN) 0.05 % nasal spray Place 1 spray into both nostrils at bedtime.    . potassium chloride (K-DUR) 10 MEQ tablet Take 10 mEq by mouth 2 (two) times daily.    . valsartan-hydrochlorothiazide (DIOVAN-HCT) 160-12.5 MG tablet Take 1 tablet by mouth daily.    . vitamin C (ASCORBIC ACID) 500 MG tablet Take 500 mg by mouth every evening.      No current facility-administered medications for this visit.     Allergies:   Patient has no known allergies.   Social History:  The patient  reports that he has never smoked. He has never used smokeless tobacco. He reports  current alcohol use. He reports that he does not use drugs.   Family History:  The patient's family history includes Early death in his father; Stroke in his father.    ROS:  Please see the history of present illness.   Otherwise, review of systems is positive for none.   All other systems are reviewed and negative.    PHYSICAL EXAM: VS:  BP 132/88   Pulse 81   Ht 6\' 1"  (1.854 m)   Wt (!) 448 lb (203.2 kg)   SpO2 94%   BMI 59.11 kg/m  , BMI Body mass index is 59.11 kg/m. GEN: Well nourished, well developed, in no acute distress  HEENT: normal  Neck: no JVD, carotid bruits, or masses Cardiac: RRR; no murmurs, rubs, or gallops,no edema  Respiratory:  clear to auscultation bilaterally, normal work of breathing GI: soft, nontender, nondistended, + BS MS: no deformity or atrophy  Skin: warm and dry, device pocket is well healed Neuro:  Strength and sensation are intact Psych: euthymic mood, full affect  EKG:  EKG is ordered today. Personal review of the ekg ordered shows atrial sensed, ventricular paced  Device interrogation is reviewed today in detail.  See PaceArt for details.   Recent Labs: 05/13/2019: Hemoglobin 16.4; Platelets 179 05/21/2019: BUN 12; Creatinine, Ser 1.09; Potassium 4.1; Sodium 141    Lipid Panel     Component Value Date/Time   CHOL 107 06/06/2016 0444   TRIG 214 (H) 06/06/2016 0444   HDL 28 (L) 06/06/2016 0444   CHOLHDL 3.8 06/06/2016 0444   VLDL 43 (H) 06/06/2016 0444   LDLCALC 36 06/06/2016 0444     Wt Readings from Last 3 Encounters:  08/05/19 (!) 448 lb (203.2 kg)  01/18/19 (!) 400 lb (181.4 kg)  12/24/18 (!) 412 lb (186.9 kg)      Other studies Reviewed: Additional studies/ records that were reviewed today include: TTE 2017 Review of the above records today demonstrates:  - Left ventricle: The cavity size was normal. There was mild   concentric hypertrophy. Systolic function was normal. The   estimated ejection fraction was in the  range of 50% to 55%. There   is akinesis of the distal septal and apical myocardium. There is   akinesis of the apicalanterior myocardium. There was a reduced   contribution of atrial contraction to ventricular filling, due to   increased ventricular diastolic pressure or atrial contractile   dysfunction. Features are consistent with a pseudonormal left   ventricular filling pattern, with concomitant abnormal relaxation   and increased filling pressure (grade 2 diastolic dysfunction).   Doppler parameters are consistent with high ventricular filling   pressure. - Ventricular septum: Septal motion showed paradox. These changes   are consistent with right ventricular pacing. - Aortic valve: Moderate diffuse thickening and calcification,   consistent with  sclerosis. - Aorta: Aortic root dimension: 39 mm (ED). - Aortic root: The aortic root was mildly dilated. - Mitral valve: Calcified annulus. - Pulmonary arteries: PA peak pressure: 35 mm Hg (S).   ASSESSMENT AND PLAN:  1.  Persistent atrial fibrillation: Currently on Xarelto and metoprolol.  Had a recent cardioversion 05/13/2019.  This patients CHA2DS2-VASc Score and unadjusted Ischemic Stroke Rate (% per year) is equal to 9.7 % stroke rate/year from a score of 6  Above score calculated as 1 point each if present [CHF, HTN, DM, Vascular=MI/PAD/Aortic Plaque, Age if 65-74, or Male] Above score calculated as 2 points each if present [Age > 75, or Stroke/TIA/TE]  2.  Coronary artery disease: Status post CABG.  No current chest pain.  3.  Ischemic cardiomyopathy: Ejection fraction has improved on most recent echo from 2017. No signs of volume overload  4.  Hypertension:well controlled   Current medicines are reviewed at length with the patient today.   The patient does not have concerns regarding his medicines.  The following changes were made today:  none  Labs/ tests ordered today include:  Orders Placed This Encounter   Procedures  . EKG 12-Lead     Disposition:   FU with   1 year  Signed,  Meredith Leeds, MD  08/05/2019 12:29 PM     [facility] [address] [place] [address] [phone] (office) [phone] (fax)

## 2019-08-17 DIAGNOSIS — N401 Enlarged prostate with lower urinary tract symptoms: Secondary | ICD-10-CM | POA: Diagnosis not present

## 2019-08-17 DIAGNOSIS — E782 Mixed hyperlipidemia: Secondary | ICD-10-CM | POA: Diagnosis not present

## 2019-08-17 DIAGNOSIS — I10 Essential (primary) hypertension: Secondary | ICD-10-CM | POA: Diagnosis not present

## 2019-08-17 DIAGNOSIS — E038 Other specified hypothyroidism: Secondary | ICD-10-CM | POA: Diagnosis not present

## 2019-09-16 DIAGNOSIS — E038 Other specified hypothyroidism: Secondary | ICD-10-CM | POA: Diagnosis not present

## 2019-09-16 DIAGNOSIS — N401 Enlarged prostate with lower urinary tract symptoms: Secondary | ICD-10-CM | POA: Diagnosis not present

## 2019-09-16 DIAGNOSIS — I10 Essential (primary) hypertension: Secondary | ICD-10-CM | POA: Diagnosis not present

## 2019-09-16 DIAGNOSIS — E782 Mixed hyperlipidemia: Secondary | ICD-10-CM | POA: Diagnosis not present

## 2019-10-04 DIAGNOSIS — N401 Enlarged prostate with lower urinary tract symptoms: Secondary | ICD-10-CM | POA: Diagnosis not present

## 2019-10-04 DIAGNOSIS — I10 Essential (primary) hypertension: Secondary | ICD-10-CM | POA: Diagnosis not present

## 2019-10-04 DIAGNOSIS — E782 Mixed hyperlipidemia: Secondary | ICD-10-CM | POA: Diagnosis not present

## 2019-10-04 DIAGNOSIS — E038 Other specified hypothyroidism: Secondary | ICD-10-CM | POA: Diagnosis not present

## 2019-10-10 DIAGNOSIS — D72829 Elevated white blood cell count, unspecified: Secondary | ICD-10-CM | POA: Diagnosis present

## 2019-10-10 DIAGNOSIS — I5033 Acute on chronic diastolic (congestive) heart failure: Secondary | ICD-10-CM | POA: Diagnosis present

## 2019-10-10 DIAGNOSIS — Z951 Presence of aortocoronary bypass graft: Secondary | ICD-10-CM | POA: Diagnosis not present

## 2019-10-10 DIAGNOSIS — R069 Unspecified abnormalities of breathing: Secondary | ICD-10-CM | POA: Diagnosis not present

## 2019-10-10 DIAGNOSIS — M7989 Other specified soft tissue disorders: Secondary | ICD-10-CM | POA: Diagnosis not present

## 2019-10-10 DIAGNOSIS — Z7401 Bed confinement status: Secondary | ICD-10-CM | POA: Diagnosis not present

## 2019-10-10 DIAGNOSIS — R339 Retention of urine, unspecified: Secondary | ICD-10-CM | POA: Diagnosis present

## 2019-10-10 DIAGNOSIS — R609 Edema, unspecified: Secondary | ICD-10-CM | POA: Diagnosis not present

## 2019-10-10 DIAGNOSIS — I442 Atrioventricular block, complete: Secondary | ICD-10-CM | POA: Diagnosis present

## 2019-10-10 DIAGNOSIS — Z8709 Personal history of other diseases of the respiratory system: Secondary | ICD-10-CM | POA: Diagnosis not present

## 2019-10-10 DIAGNOSIS — E039 Hypothyroidism, unspecified: Secondary | ICD-10-CM | POA: Diagnosis present

## 2019-10-10 DIAGNOSIS — I252 Old myocardial infarction: Secondary | ICD-10-CM | POA: Diagnosis not present

## 2019-10-10 DIAGNOSIS — M79662 Pain in left lower leg: Secondary | ICD-10-CM | POA: Diagnosis not present

## 2019-10-10 DIAGNOSIS — E119 Type 2 diabetes mellitus without complications: Secondary | ICD-10-CM | POA: Diagnosis present

## 2019-10-10 DIAGNOSIS — B957 Other staphylococcus as the cause of diseases classified elsewhere: Secondary | ICD-10-CM | POA: Diagnosis present

## 2019-10-10 DIAGNOSIS — I499 Cardiac arrhythmia, unspecified: Secondary | ICD-10-CM | POA: Diagnosis not present

## 2019-10-10 DIAGNOSIS — R5381 Other malaise: Secondary | ICD-10-CM | POA: Diagnosis not present

## 2019-10-10 DIAGNOSIS — I2510 Atherosclerotic heart disease of native coronary artery without angina pectoris: Secondary | ICD-10-CM | POA: Diagnosis not present

## 2019-10-10 DIAGNOSIS — I4891 Unspecified atrial fibrillation: Secondary | ICD-10-CM | POA: Diagnosis not present

## 2019-10-10 DIAGNOSIS — G4733 Obstructive sleep apnea (adult) (pediatric): Secondary | ICD-10-CM | POA: Diagnosis not present

## 2019-10-10 DIAGNOSIS — R0602 Shortness of breath: Secondary | ICD-10-CM | POA: Diagnosis not present

## 2019-10-10 DIAGNOSIS — M79661 Pain in right lower leg: Secondary | ICD-10-CM | POA: Diagnosis not present

## 2019-10-10 DIAGNOSIS — Z95810 Presence of automatic (implantable) cardiac defibrillator: Secondary | ICD-10-CM | POA: Diagnosis not present

## 2019-10-10 DIAGNOSIS — I4892 Unspecified atrial flutter: Secondary | ICD-10-CM | POA: Diagnosis not present

## 2019-10-10 DIAGNOSIS — I255 Ischemic cardiomyopathy: Secondary | ICD-10-CM | POA: Diagnosis present

## 2019-10-10 DIAGNOSIS — R0600 Dyspnea, unspecified: Secondary | ICD-10-CM | POA: Diagnosis not present

## 2019-10-10 DIAGNOSIS — I509 Heart failure, unspecified: Secondary | ICD-10-CM | POA: Diagnosis not present

## 2019-10-10 DIAGNOSIS — Z6841 Body Mass Index (BMI) 40.0 and over, adult: Secondary | ICD-10-CM | POA: Diagnosis not present

## 2019-10-10 DIAGNOSIS — N179 Acute kidney failure, unspecified: Secondary | ICD-10-CM | POA: Diagnosis not present

## 2019-10-10 DIAGNOSIS — I517 Cardiomegaly: Secondary | ICD-10-CM | POA: Diagnosis not present

## 2019-10-10 DIAGNOSIS — M6281 Muscle weakness (generalized): Secondary | ICD-10-CM | POA: Diagnosis not present

## 2019-10-10 DIAGNOSIS — E876 Hypokalemia: Secondary | ICD-10-CM | POA: Diagnosis present

## 2019-10-10 DIAGNOSIS — I361 Nonrheumatic tricuspid (valve) insufficiency: Secondary | ICD-10-CM | POA: Diagnosis not present

## 2019-10-10 DIAGNOSIS — I5043 Acute on chronic combined systolic (congestive) and diastolic (congestive) heart failure: Secondary | ICD-10-CM | POA: Diagnosis not present

## 2019-10-10 DIAGNOSIS — N390 Urinary tract infection, site not specified: Secondary | ICD-10-CM | POA: Diagnosis present

## 2019-10-10 DIAGNOSIS — J9600 Acute respiratory failure, unspecified whether with hypoxia or hypercapnia: Secondary | ICD-10-CM | POA: Diagnosis not present

## 2019-10-10 DIAGNOSIS — E785 Hyperlipidemia, unspecified: Secondary | ICD-10-CM | POA: Diagnosis present

## 2019-10-10 DIAGNOSIS — R69 Illness, unspecified: Secondary | ICD-10-CM | POA: Diagnosis not present

## 2019-10-10 DIAGNOSIS — R918 Other nonspecific abnormal finding of lung field: Secondary | ICD-10-CM | POA: Diagnosis not present

## 2019-10-10 DIAGNOSIS — J9601 Acute respiratory failure with hypoxia: Secondary | ICD-10-CM | POA: Diagnosis present

## 2019-10-10 DIAGNOSIS — I110 Hypertensive heart disease with heart failure: Secondary | ICD-10-CM | POA: Diagnosis not present

## 2019-10-10 DIAGNOSIS — Z03818 Encounter for observation for suspected exposure to other biological agents ruled out: Secondary | ICD-10-CM | POA: Diagnosis not present

## 2019-10-10 DIAGNOSIS — R0689 Other abnormalities of breathing: Secondary | ICD-10-CM | POA: Diagnosis not present

## 2019-10-10 DIAGNOSIS — J9811 Atelectasis: Secondary | ICD-10-CM | POA: Diagnosis present

## 2019-10-10 DIAGNOSIS — G9341 Metabolic encephalopathy: Secondary | ICD-10-CM | POA: Diagnosis not present

## 2019-10-10 DIAGNOSIS — K219 Gastro-esophageal reflux disease without esophagitis: Secondary | ICD-10-CM | POA: Diagnosis present

## 2019-10-10 DIAGNOSIS — I5023 Acute on chronic systolic (congestive) heart failure: Secondary | ICD-10-CM | POA: Diagnosis not present

## 2019-10-11 DIAGNOSIS — I517 Cardiomegaly: Secondary | ICD-10-CM | POA: Diagnosis not present

## 2019-10-11 DIAGNOSIS — I509 Heart failure, unspecified: Secondary | ICD-10-CM | POA: Diagnosis not present

## 2019-10-11 DIAGNOSIS — R609 Edema, unspecified: Secondary | ICD-10-CM | POA: Diagnosis not present

## 2019-10-11 DIAGNOSIS — I5033 Acute on chronic diastolic (congestive) heart failure: Secondary | ICD-10-CM | POA: Diagnosis not present

## 2019-10-12 DIAGNOSIS — I509 Heart failure, unspecified: Secondary | ICD-10-CM | POA: Diagnosis not present

## 2019-10-12 DIAGNOSIS — R609 Edema, unspecified: Secondary | ICD-10-CM | POA: Diagnosis not present

## 2019-10-12 DIAGNOSIS — I361 Nonrheumatic tricuspid (valve) insufficiency: Secondary | ICD-10-CM

## 2019-10-12 DIAGNOSIS — I517 Cardiomegaly: Secondary | ICD-10-CM | POA: Diagnosis not present

## 2019-10-13 DIAGNOSIS — I509 Heart failure, unspecified: Secondary | ICD-10-CM | POA: Diagnosis not present

## 2019-10-13 DIAGNOSIS — I517 Cardiomegaly: Secondary | ICD-10-CM | POA: Diagnosis not present

## 2019-10-13 DIAGNOSIS — R609 Edema, unspecified: Secondary | ICD-10-CM | POA: Diagnosis not present

## 2019-10-14 DIAGNOSIS — I509 Heart failure, unspecified: Secondary | ICD-10-CM | POA: Diagnosis not present

## 2019-10-14 DIAGNOSIS — R609 Edema, unspecified: Secondary | ICD-10-CM | POA: Diagnosis not present

## 2019-10-14 DIAGNOSIS — I517 Cardiomegaly: Secondary | ICD-10-CM | POA: Diagnosis not present

## 2019-10-15 DIAGNOSIS — I517 Cardiomegaly: Secondary | ICD-10-CM | POA: Diagnosis not present

## 2019-10-15 DIAGNOSIS — G4733 Obstructive sleep apnea (adult) (pediatric): Secondary | ICD-10-CM

## 2019-10-15 DIAGNOSIS — Z95810 Presence of automatic (implantable) cardiac defibrillator: Secondary | ICD-10-CM

## 2019-10-15 DIAGNOSIS — R609 Edema, unspecified: Secondary | ICD-10-CM | POA: Diagnosis not present

## 2019-10-15 DIAGNOSIS — I509 Heart failure, unspecified: Secondary | ICD-10-CM

## 2019-10-15 DIAGNOSIS — I2510 Atherosclerotic heart disease of native coronary artery without angina pectoris: Secondary | ICD-10-CM

## 2019-10-16 DIAGNOSIS — R609 Edema, unspecified: Secondary | ICD-10-CM | POA: Diagnosis not present

## 2019-10-16 DIAGNOSIS — I4892 Unspecified atrial flutter: Secondary | ICD-10-CM

## 2019-10-16 DIAGNOSIS — I509 Heart failure, unspecified: Secondary | ICD-10-CM | POA: Diagnosis not present

## 2019-10-16 DIAGNOSIS — I517 Cardiomegaly: Secondary | ICD-10-CM | POA: Diagnosis not present

## 2019-10-17 DIAGNOSIS — Z95810 Presence of automatic (implantable) cardiac defibrillator: Secondary | ICD-10-CM

## 2019-10-17 DIAGNOSIS — I2510 Atherosclerotic heart disease of native coronary artery without angina pectoris: Secondary | ICD-10-CM

## 2019-10-17 DIAGNOSIS — I517 Cardiomegaly: Secondary | ICD-10-CM | POA: Diagnosis not present

## 2019-10-17 DIAGNOSIS — I509 Heart failure, unspecified: Secondary | ICD-10-CM

## 2019-10-17 DIAGNOSIS — R609 Edema, unspecified: Secondary | ICD-10-CM | POA: Diagnosis not present

## 2019-10-17 DIAGNOSIS — G4733 Obstructive sleep apnea (adult) (pediatric): Secondary | ICD-10-CM

## 2019-10-17 DIAGNOSIS — I4892 Unspecified atrial flutter: Secondary | ICD-10-CM

## 2019-10-18 DIAGNOSIS — I509 Heart failure, unspecified: Secondary | ICD-10-CM | POA: Diagnosis not present

## 2019-10-18 DIAGNOSIS — I517 Cardiomegaly: Secondary | ICD-10-CM | POA: Diagnosis not present

## 2019-10-18 DIAGNOSIS — R609 Edema, unspecified: Secondary | ICD-10-CM | POA: Diagnosis not present

## 2019-10-19 DIAGNOSIS — I517 Cardiomegaly: Secondary | ICD-10-CM | POA: Diagnosis not present

## 2019-10-19 DIAGNOSIS — I509 Heart failure, unspecified: Secondary | ICD-10-CM | POA: Diagnosis not present

## 2019-10-19 DIAGNOSIS — R609 Edema, unspecified: Secondary | ICD-10-CM | POA: Diagnosis not present

## 2019-10-20 DIAGNOSIS — E785 Hyperlipidemia, unspecified: Secondary | ICD-10-CM | POA: Diagnosis not present

## 2019-10-20 DIAGNOSIS — I255 Ischemic cardiomyopathy: Secondary | ICD-10-CM | POA: Diagnosis not present

## 2019-10-20 DIAGNOSIS — I4891 Unspecified atrial fibrillation: Secondary | ICD-10-CM | POA: Diagnosis not present

## 2019-10-20 DIAGNOSIS — I2510 Atherosclerotic heart disease of native coronary artery without angina pectoris: Secondary | ICD-10-CM | POA: Diagnosis not present

## 2019-10-20 DIAGNOSIS — Z7401 Bed confinement status: Secondary | ICD-10-CM | POA: Diagnosis not present

## 2019-10-20 DIAGNOSIS — R609 Edema, unspecified: Secondary | ICD-10-CM | POA: Diagnosis not present

## 2019-10-20 DIAGNOSIS — R69 Illness, unspecified: Secondary | ICD-10-CM | POA: Diagnosis not present

## 2019-10-20 DIAGNOSIS — M6281 Muscle weakness (generalized): Secondary | ICD-10-CM | POA: Diagnosis not present

## 2019-10-20 DIAGNOSIS — I5023 Acute on chronic systolic (congestive) heart failure: Secondary | ICD-10-CM | POA: Diagnosis not present

## 2019-10-20 DIAGNOSIS — I509 Heart failure, unspecified: Secondary | ICD-10-CM | POA: Diagnosis not present

## 2019-10-20 DIAGNOSIS — Z95810 Presence of automatic (implantable) cardiac defibrillator: Secondary | ICD-10-CM | POA: Diagnosis not present

## 2019-10-20 DIAGNOSIS — J9600 Acute respiratory failure, unspecified whether with hypoxia or hypercapnia: Secondary | ICD-10-CM | POA: Diagnosis not present

## 2019-10-20 DIAGNOSIS — K219 Gastro-esophageal reflux disease without esophagitis: Secondary | ICD-10-CM | POA: Diagnosis not present

## 2019-10-20 DIAGNOSIS — R5381 Other malaise: Secondary | ICD-10-CM | POA: Diagnosis not present

## 2019-10-20 DIAGNOSIS — G4733 Obstructive sleep apnea (adult) (pediatric): Secondary | ICD-10-CM | POA: Diagnosis not present

## 2019-10-20 DIAGNOSIS — R0600 Dyspnea, unspecified: Secondary | ICD-10-CM | POA: Diagnosis not present

## 2019-10-20 DIAGNOSIS — I517 Cardiomegaly: Secondary | ICD-10-CM | POA: Diagnosis not present

## 2019-10-20 DIAGNOSIS — E039 Hypothyroidism, unspecified: Secondary | ICD-10-CM | POA: Diagnosis not present

## 2019-10-21 DIAGNOSIS — Z95810 Presence of automatic (implantable) cardiac defibrillator: Secondary | ICD-10-CM | POA: Diagnosis not present

## 2019-10-21 DIAGNOSIS — I4891 Unspecified atrial fibrillation: Secondary | ICD-10-CM | POA: Diagnosis not present

## 2019-10-21 DIAGNOSIS — R5381 Other malaise: Secondary | ICD-10-CM | POA: Diagnosis not present

## 2019-10-21 DIAGNOSIS — I255 Ischemic cardiomyopathy: Secondary | ICD-10-CM | POA: Diagnosis not present

## 2019-10-22 DIAGNOSIS — Z20828 Contact with and (suspected) exposure to other viral communicable diseases: Secondary | ICD-10-CM | POA: Diagnosis not present

## 2019-10-25 ENCOUNTER — Ambulatory Visit (INDEPENDENT_AMBULATORY_CARE_PROVIDER_SITE_OTHER): Payer: Medicare Other | Admitting: *Deleted

## 2019-10-25 DIAGNOSIS — I442 Atrioventricular block, complete: Secondary | ICD-10-CM

## 2019-10-27 LAB — CUP PACEART REMOTE DEVICE CHECK
Battery Remaining Longevity: 86 mo
Battery Remaining Percentage: 86 %
Battery Voltage: 3.04 V
Brady Statistic AP VP Percent: 6.9 %
Brady Statistic AP VS Percent: 1 %
Brady Statistic AS VP Percent: 86 %
Brady Statistic AS VS Percent: 5.6 %
Brady Statistic RA Percent Paced: 1.1 %
Brady Statistic RV Percent Paced: 90 %
Date Time Interrogation Session: 20201204211716
HighPow Impedance: 55 Ohm
HighPow Impedance: 55 Ohm
Implantable Lead Implant Date: 20020930
Implantable Lead Implant Date: 20110303
Implantable Lead Location: 753859
Implantable Lead Location: 753860
Implantable Lead Model: 7121
Implantable Pulse Generator Implant Date: 20200228
Lead Channel Impedance Value: 380 Ohm
Lead Channel Impedance Value: 400 Ohm
Lead Channel Pacing Threshold Amplitude: 0.75 V
Lead Channel Pacing Threshold Amplitude: 0.75 V
Lead Channel Pacing Threshold Pulse Width: 0.5 ms
Lead Channel Pacing Threshold Pulse Width: 0.5 ms
Lead Channel Sensing Intrinsic Amplitude: 10.6 mV
Lead Channel Sensing Intrinsic Amplitude: 2.7 mV
Lead Channel Setting Pacing Amplitude: 1 V
Lead Channel Setting Pacing Amplitude: 2 V
Lead Channel Setting Pacing Pulse Width: 0.5 ms
Lead Channel Setting Sensing Sensitivity: 0.5 mV
Pulse Gen Serial Number: 9834627

## 2019-10-28 ENCOUNTER — Other Ambulatory Visit: Payer: Self-pay | Admitting: *Deleted

## 2019-10-28 NOTE — Patient Outreach (Signed)
Member screened for potential [facility] Care Management needs as a benefit of [place] [employer].  Telephone call received from [place] indicating per [facility] dc planner, Mr. Carrero left AMA from [facility] on Friday, 10/24/19. Home health was not arranged by facility prior.   Request made for writer to contact regarding disposition needs and for potential [facility] Care Management services.   Telephone call made to Mr. Thieme's home at [phone]. Mrs. Cornfield (wife) answered the phone. Patient identifiers confirmed. Attempted to explain [facility] Care Management services. Verified Dr. Jannette Fogo is PCP ( non [facility] provider).  Wife reports Dr. Tenna Delaine office has arranged home health thru [facility]. Mrs. Esguerra denies any further [facility] needs.   Marthenia Rolling, MSN-Ed, RN,BSN [place] Acute Care Coordinator [phone] [facility]) [phone]  (Toll free office)

## 2019-10-29 DIAGNOSIS — Z6841 Body Mass Index (BMI) 40.0 and over, adult: Secondary | ICD-10-CM | POA: Diagnosis not present

## 2019-10-29 DIAGNOSIS — Z95810 Presence of automatic (implantable) cardiac defibrillator: Secondary | ICD-10-CM | POA: Diagnosis not present

## 2019-10-29 DIAGNOSIS — Z955 Presence of coronary angioplasty implant and graft: Secondary | ICD-10-CM | POA: Diagnosis not present

## 2019-10-29 DIAGNOSIS — N179 Acute kidney failure, unspecified: Secondary | ICD-10-CM | POA: Diagnosis not present

## 2019-10-29 DIAGNOSIS — I5030 Unspecified diastolic (congestive) heart failure: Secondary | ICD-10-CM | POA: Diagnosis not present

## 2019-10-29 DIAGNOSIS — E119 Type 2 diabetes mellitus without complications: Secondary | ICD-10-CM | POA: Diagnosis not present

## 2019-10-29 DIAGNOSIS — R339 Retention of urine, unspecified: Secondary | ICD-10-CM | POA: Diagnosis not present

## 2019-10-29 DIAGNOSIS — G4733 Obstructive sleep apnea (adult) (pediatric): Secondary | ICD-10-CM | POA: Diagnosis not present

## 2019-10-29 DIAGNOSIS — K219 Gastro-esophageal reflux disease without esophagitis: Secondary | ICD-10-CM | POA: Diagnosis not present

## 2019-10-29 DIAGNOSIS — M109 Gout, unspecified: Secondary | ICD-10-CM | POA: Diagnosis not present

## 2019-10-29 DIAGNOSIS — I255 Ischemic cardiomyopathy: Secondary | ICD-10-CM | POA: Diagnosis not present

## 2019-10-29 DIAGNOSIS — E039 Hypothyroidism, unspecified: Secondary | ICD-10-CM | POA: Diagnosis not present

## 2019-10-29 DIAGNOSIS — I480 Paroxysmal atrial fibrillation: Secondary | ICD-10-CM | POA: Diagnosis not present

## 2019-10-29 DIAGNOSIS — I2510 Atherosclerotic heart disease of native coronary artery without angina pectoris: Secondary | ICD-10-CM | POA: Diagnosis not present

## 2019-10-29 DIAGNOSIS — E785 Hyperlipidemia, unspecified: Secondary | ICD-10-CM | POA: Diagnosis not present

## 2019-10-29 DIAGNOSIS — I110 Hypertensive heart disease with heart failure: Secondary | ICD-10-CM | POA: Diagnosis not present

## 2019-10-29 DIAGNOSIS — R410 Disorientation, unspecified: Secondary | ICD-10-CM | POA: Diagnosis not present

## 2019-10-31 ENCOUNTER — Telehealth: Payer: Self-pay | Admitting: Cardiology

## 2019-10-31 DIAGNOSIS — I5032 Chronic diastolic (congestive) heart failure: Secondary | ICD-10-CM

## 2019-10-31 DIAGNOSIS — I255 Ischemic cardiomyopathy: Secondary | ICD-10-CM

## 2019-10-31 NOTE — Telephone Encounter (Signed)
Please call as patient is followed by Dr. Curt Bears and Dr. Caryl Comes. Thanks!

## 2019-10-31 NOTE — Telephone Encounter (Signed)
Patients wife would like to speak to someone regarding her husbands care and direction that it is going.

## 2019-11-01 NOTE — Telephone Encounter (Signed)
Wife calls in concerning husband's care (pt in background listening/speaking). Wants to know if we knew anything of his 12 day hospital stay at [facility] for CHF about 3/4 weeks ago.  Reports that he went in at 470 lb and d/c at 390 lb. She denies pt is having any acute HF symptoms at this time. Denies edema, weight gain, SOB, dizziness.  Informed wife that I am not sure if Dr. Curt Bears has been made aware of this hospitalization.  Aware that I will obtain records and have Dr. Curt Bears review.  She reports that they sent pt to rehab but that he wasn't getting much rehab while he was in quarantine for 2 weeks, pt left AMA 1 week into rehab stay.  PCP has helped with ordering [facility] [place] [facility] ([place]). PT comes BID, HH aide comes once a week. Wife is concerned that pt needs more help.  States he is a "big man and very high fall risk".  She feels he needs more help that what she can give as she cannot help lift/transfer him. She is concerned that his cardiologist is unaware of recent issues and that he needs follow up w/ cardiology.  She and discussed the possible need for pt to establish w/ general cardiologist there in [place] office for more streamlined care of pt, d/t [facility] only going there twice a month, along w/ transportation issues w/ pt.   Wife agrees that this would probably be the best plan going forward and agreeable to Dr. Curt Bears continuing to follow pt's ICD. Wife aware that I will discuss this w/ Dr. Curt Bears and that it may be next week before hearing back from me as [facility] is not in the office until Tuesday. Wife verbalized understanding and agreeable to plan.

## 2019-11-04 DIAGNOSIS — N401 Enlarged prostate with lower urinary tract symptoms: Secondary | ICD-10-CM | POA: Diagnosis not present

## 2019-11-04 DIAGNOSIS — I10 Essential (primary) hypertension: Secondary | ICD-10-CM | POA: Diagnosis not present

## 2019-11-04 DIAGNOSIS — K219 Gastro-esophageal reflux disease without esophagitis: Secondary | ICD-10-CM | POA: Diagnosis not present

## 2019-11-04 DIAGNOSIS — E782 Mixed hyperlipidemia: Secondary | ICD-10-CM | POA: Diagnosis not present

## 2019-11-07 NOTE — Telephone Encounter (Signed)
Pt needs to establish with a general cardiologist in [place]. Will place referral in [facility]

## 2019-11-07 NOTE — Addendum Note (Signed)
Addended by: Stanton Kidney on: 11/07/2019 05:59 PM   Modules accepted: Orders

## 2019-11-12 ENCOUNTER — Other Ambulatory Visit: Payer: Self-pay | Admitting: Cardiology

## 2019-11-12 NOTE — Telephone Encounter (Signed)
Followed up about referral refusal.  Wife informs me that due to pt not being able to get out of house on his own and she cannot assist, he can't get to appt currently.  She reports that they have hospice involved (not d/t end of life measures) in order to try getting all services needed for him, HH has not been sufficient.  They are also working on getting with the [facility] for help. Advised to discuss w/ current care team, transportation plans. to ensure pt gets to needed cardiology follow up. Appt made for consult w/ Dr. Bettina Gavia on 1/27. She will call office in [place] to schedule sooner appt if able to arrange transportation . She appreciates my follow up and recommendation.

## 2019-11-22 DIAGNOSIS — I10 Essential (primary) hypertension: Secondary | ICD-10-CM | POA: Diagnosis not present

## 2019-11-22 DIAGNOSIS — I480 Paroxysmal atrial fibrillation: Secondary | ICD-10-CM | POA: Diagnosis not present

## 2019-11-22 DIAGNOSIS — K219 Gastro-esophageal reflux disease without esophagitis: Secondary | ICD-10-CM | POA: Diagnosis not present

## 2019-11-22 DIAGNOSIS — I255 Ischemic cardiomyopathy: Secondary | ICD-10-CM | POA: Diagnosis not present

## 2019-11-22 DIAGNOSIS — I5032 Chronic diastolic (congestive) heart failure: Secondary | ICD-10-CM | POA: Diagnosis not present

## 2019-11-22 DIAGNOSIS — G4733 Obstructive sleep apnea (adult) (pediatric): Secondary | ICD-10-CM | POA: Diagnosis not present

## 2019-11-22 DIAGNOSIS — E782 Mixed hyperlipidemia: Secondary | ICD-10-CM | POA: Diagnosis not present

## 2019-11-22 DIAGNOSIS — R338 Other retention of urine: Secondary | ICD-10-CM | POA: Diagnosis not present

## 2019-11-22 DIAGNOSIS — M1000 Idiopathic gout, unspecified site: Secondary | ICD-10-CM | POA: Diagnosis not present

## 2019-11-22 DIAGNOSIS — E038 Other specified hypothyroidism: Secondary | ICD-10-CM | POA: Diagnosis not present

## 2019-11-25 DIAGNOSIS — I5032 Chronic diastolic (congestive) heart failure: Secondary | ICD-10-CM | POA: Diagnosis not present

## 2019-11-25 DIAGNOSIS — I10 Essential (primary) hypertension: Secondary | ICD-10-CM | POA: Diagnosis not present

## 2019-11-25 DIAGNOSIS — E782 Mixed hyperlipidemia: Secondary | ICD-10-CM | POA: Diagnosis not present

## 2019-11-25 DIAGNOSIS — I255 Ischemic cardiomyopathy: Secondary | ICD-10-CM | POA: Diagnosis not present

## 2019-11-25 DIAGNOSIS — K219 Gastro-esophageal reflux disease without esophagitis: Secondary | ICD-10-CM | POA: Diagnosis not present

## 2019-11-25 DIAGNOSIS — E038 Other specified hypothyroidism: Secondary | ICD-10-CM | POA: Diagnosis not present

## 2019-11-27 DIAGNOSIS — I5032 Chronic diastolic (congestive) heart failure: Secondary | ICD-10-CM | POA: Diagnosis not present

## 2019-11-27 DIAGNOSIS — I10 Essential (primary) hypertension: Secondary | ICD-10-CM | POA: Diagnosis not present

## 2019-11-27 DIAGNOSIS — K219 Gastro-esophageal reflux disease without esophagitis: Secondary | ICD-10-CM | POA: Diagnosis not present

## 2019-11-27 DIAGNOSIS — I255 Ischemic cardiomyopathy: Secondary | ICD-10-CM | POA: Diagnosis not present

## 2019-11-27 DIAGNOSIS — E038 Other specified hypothyroidism: Secondary | ICD-10-CM | POA: Diagnosis not present

## 2019-11-27 DIAGNOSIS — E782 Mixed hyperlipidemia: Secondary | ICD-10-CM | POA: Diagnosis not present

## 2019-11-28 DIAGNOSIS — K219 Gastro-esophageal reflux disease without esophagitis: Secondary | ICD-10-CM | POA: Diagnosis not present

## 2019-11-28 DIAGNOSIS — I5032 Chronic diastolic (congestive) heart failure: Secondary | ICD-10-CM | POA: Diagnosis not present

## 2019-11-28 DIAGNOSIS — E782 Mixed hyperlipidemia: Secondary | ICD-10-CM | POA: Diagnosis not present

## 2019-11-28 DIAGNOSIS — E038 Other specified hypothyroidism: Secondary | ICD-10-CM | POA: Diagnosis not present

## 2019-11-28 DIAGNOSIS — I10 Essential (primary) hypertension: Secondary | ICD-10-CM | POA: Diagnosis not present

## 2019-11-28 DIAGNOSIS — I255 Ischemic cardiomyopathy: Secondary | ICD-10-CM | POA: Diagnosis not present

## 2019-11-29 DIAGNOSIS — I10 Essential (primary) hypertension: Secondary | ICD-10-CM | POA: Diagnosis not present

## 2019-11-29 DIAGNOSIS — E782 Mixed hyperlipidemia: Secondary | ICD-10-CM | POA: Diagnosis not present

## 2019-11-29 DIAGNOSIS — K219 Gastro-esophageal reflux disease without esophagitis: Secondary | ICD-10-CM | POA: Diagnosis not present

## 2019-11-29 DIAGNOSIS — I5032 Chronic diastolic (congestive) heart failure: Secondary | ICD-10-CM | POA: Diagnosis not present

## 2019-11-29 DIAGNOSIS — E038 Other specified hypothyroidism: Secondary | ICD-10-CM | POA: Diagnosis not present

## 2019-11-29 DIAGNOSIS — I255 Ischemic cardiomyopathy: Secondary | ICD-10-CM | POA: Diagnosis not present

## 2019-12-02 DIAGNOSIS — I10 Essential (primary) hypertension: Secondary | ICD-10-CM | POA: Diagnosis not present

## 2019-12-02 DIAGNOSIS — E782 Mixed hyperlipidemia: Secondary | ICD-10-CM | POA: Diagnosis not present

## 2019-12-02 DIAGNOSIS — I5032 Chronic diastolic (congestive) heart failure: Secondary | ICD-10-CM | POA: Diagnosis not present

## 2019-12-02 DIAGNOSIS — E038 Other specified hypothyroidism: Secondary | ICD-10-CM | POA: Diagnosis not present

## 2019-12-02 DIAGNOSIS — I255 Ischemic cardiomyopathy: Secondary | ICD-10-CM | POA: Diagnosis not present

## 2019-12-02 DIAGNOSIS — K219 Gastro-esophageal reflux disease without esophagitis: Secondary | ICD-10-CM | POA: Diagnosis not present

## 2019-12-03 DIAGNOSIS — E038 Other specified hypothyroidism: Secondary | ICD-10-CM | POA: Diagnosis not present

## 2019-12-03 DIAGNOSIS — I5032 Chronic diastolic (congestive) heart failure: Secondary | ICD-10-CM | POA: Diagnosis not present

## 2019-12-03 DIAGNOSIS — I10 Essential (primary) hypertension: Secondary | ICD-10-CM | POA: Diagnosis not present

## 2019-12-03 DIAGNOSIS — E782 Mixed hyperlipidemia: Secondary | ICD-10-CM | POA: Diagnosis not present

## 2019-12-03 DIAGNOSIS — K219 Gastro-esophageal reflux disease without esophagitis: Secondary | ICD-10-CM | POA: Diagnosis not present

## 2019-12-03 DIAGNOSIS — I255 Ischemic cardiomyopathy: Secondary | ICD-10-CM | POA: Diagnosis not present

## 2019-12-04 ENCOUNTER — Telehealth: Payer: Self-pay

## 2019-12-04 DIAGNOSIS — I255 Ischemic cardiomyopathy: Secondary | ICD-10-CM | POA: Diagnosis not present

## 2019-12-04 DIAGNOSIS — K219 Gastro-esophageal reflux disease without esophagitis: Secondary | ICD-10-CM | POA: Diagnosis not present

## 2019-12-04 DIAGNOSIS — E782 Mixed hyperlipidemia: Secondary | ICD-10-CM | POA: Diagnosis not present

## 2019-12-04 DIAGNOSIS — E038 Other specified hypothyroidism: Secondary | ICD-10-CM | POA: Diagnosis not present

## 2019-12-04 DIAGNOSIS — I5032 Chronic diastolic (congestive) heart failure: Secondary | ICD-10-CM | POA: Diagnosis not present

## 2019-12-04 DIAGNOSIS — I10 Essential (primary) hypertension: Secondary | ICD-10-CM | POA: Diagnosis not present

## 2019-12-04 NOTE — Telephone Encounter (Signed)
Sp[oke with pt regarding remote device interrogation, advised of results.  Pt states he is feeling fine, therefore no changes needed from EP standpoint.  Pt then questioned his upcoming appt with Dr. Bettina Gavia.  Pt indicates high cost of transportation as detering him from attending the visit.  Message sent to MD to determine if appt can be conducted as virtual visit.

## 2019-12-04 NOTE — Telephone Encounter (Signed)
-----   Message from Caleb Meredith Leeds, MD sent at 10/29/2019 11:30 AM EST ----- Normal remote reviewed. Battery and lead parameters stable.  Patient back in atrial fibrillation.  Feeling well no changes if feeling poorly Caleb need A. fib clinic visit

## 2019-12-05 DIAGNOSIS — E038 Other specified hypothyroidism: Secondary | ICD-10-CM | POA: Diagnosis not present

## 2019-12-05 DIAGNOSIS — I10 Essential (primary) hypertension: Secondary | ICD-10-CM | POA: Diagnosis not present

## 2019-12-05 DIAGNOSIS — I5032 Chronic diastolic (congestive) heart failure: Secondary | ICD-10-CM | POA: Diagnosis not present

## 2019-12-05 DIAGNOSIS — I255 Ischemic cardiomyopathy: Secondary | ICD-10-CM | POA: Diagnosis not present

## 2019-12-05 DIAGNOSIS — E782 Mixed hyperlipidemia: Secondary | ICD-10-CM | POA: Diagnosis not present

## 2019-12-05 DIAGNOSIS — K219 Gastro-esophageal reflux disease without esophagitis: Secondary | ICD-10-CM | POA: Diagnosis not present

## 2019-12-06 DIAGNOSIS — I255 Ischemic cardiomyopathy: Secondary | ICD-10-CM | POA: Diagnosis not present

## 2019-12-06 DIAGNOSIS — E038 Other specified hypothyroidism: Secondary | ICD-10-CM | POA: Diagnosis not present

## 2019-12-06 DIAGNOSIS — I5032 Chronic diastolic (congestive) heart failure: Secondary | ICD-10-CM | POA: Diagnosis not present

## 2019-12-06 DIAGNOSIS — E782 Mixed hyperlipidemia: Secondary | ICD-10-CM | POA: Diagnosis not present

## 2019-12-06 DIAGNOSIS — I10 Essential (primary) hypertension: Secondary | ICD-10-CM | POA: Diagnosis not present

## 2019-12-06 DIAGNOSIS — K219 Gastro-esophageal reflux disease without esophagitis: Secondary | ICD-10-CM | POA: Diagnosis not present

## 2019-12-09 DIAGNOSIS — E782 Mixed hyperlipidemia: Secondary | ICD-10-CM | POA: Diagnosis not present

## 2019-12-09 DIAGNOSIS — I10 Essential (primary) hypertension: Secondary | ICD-10-CM | POA: Diagnosis not present

## 2019-12-09 DIAGNOSIS — K219 Gastro-esophageal reflux disease without esophagitis: Secondary | ICD-10-CM | POA: Diagnosis not present

## 2019-12-09 DIAGNOSIS — I255 Ischemic cardiomyopathy: Secondary | ICD-10-CM | POA: Diagnosis not present

## 2019-12-09 DIAGNOSIS — E038 Other specified hypothyroidism: Secondary | ICD-10-CM | POA: Diagnosis not present

## 2019-12-09 DIAGNOSIS — I5032 Chronic diastolic (congestive) heart failure: Secondary | ICD-10-CM | POA: Diagnosis not present

## 2019-12-10 DIAGNOSIS — E782 Mixed hyperlipidemia: Secondary | ICD-10-CM | POA: Diagnosis not present

## 2019-12-10 DIAGNOSIS — I255 Ischemic cardiomyopathy: Secondary | ICD-10-CM | POA: Diagnosis not present

## 2019-12-10 DIAGNOSIS — K219 Gastro-esophageal reflux disease without esophagitis: Secondary | ICD-10-CM | POA: Diagnosis not present

## 2019-12-10 DIAGNOSIS — I5032 Chronic diastolic (congestive) heart failure: Secondary | ICD-10-CM | POA: Diagnosis not present

## 2019-12-10 DIAGNOSIS — I10 Essential (primary) hypertension: Secondary | ICD-10-CM | POA: Diagnosis not present

## 2019-12-10 DIAGNOSIS — E038 Other specified hypothyroidism: Secondary | ICD-10-CM | POA: Diagnosis not present

## 2019-12-11 DIAGNOSIS — E782 Mixed hyperlipidemia: Secondary | ICD-10-CM | POA: Diagnosis not present

## 2019-12-11 DIAGNOSIS — I255 Ischemic cardiomyopathy: Secondary | ICD-10-CM | POA: Diagnosis not present

## 2019-12-11 DIAGNOSIS — K219 Gastro-esophageal reflux disease without esophagitis: Secondary | ICD-10-CM | POA: Diagnosis not present

## 2019-12-11 DIAGNOSIS — I10 Essential (primary) hypertension: Secondary | ICD-10-CM | POA: Diagnosis not present

## 2019-12-11 DIAGNOSIS — I5032 Chronic diastolic (congestive) heart failure: Secondary | ICD-10-CM | POA: Diagnosis not present

## 2019-12-11 DIAGNOSIS — E038 Other specified hypothyroidism: Secondary | ICD-10-CM | POA: Diagnosis not present

## 2019-12-12 DIAGNOSIS — E038 Other specified hypothyroidism: Secondary | ICD-10-CM | POA: Diagnosis not present

## 2019-12-12 DIAGNOSIS — E782 Mixed hyperlipidemia: Secondary | ICD-10-CM | POA: Diagnosis not present

## 2019-12-12 DIAGNOSIS — I5032 Chronic diastolic (congestive) heart failure: Secondary | ICD-10-CM | POA: Diagnosis not present

## 2019-12-12 DIAGNOSIS — I10 Essential (primary) hypertension: Secondary | ICD-10-CM | POA: Diagnosis not present

## 2019-12-12 DIAGNOSIS — K219 Gastro-esophageal reflux disease without esophagitis: Secondary | ICD-10-CM | POA: Diagnosis not present

## 2019-12-12 DIAGNOSIS — I255 Ischemic cardiomyopathy: Secondary | ICD-10-CM | POA: Diagnosis not present

## 2019-12-13 DIAGNOSIS — I10 Essential (primary) hypertension: Secondary | ICD-10-CM | POA: Diagnosis not present

## 2019-12-13 DIAGNOSIS — E782 Mixed hyperlipidemia: Secondary | ICD-10-CM | POA: Diagnosis not present

## 2019-12-13 DIAGNOSIS — E038 Other specified hypothyroidism: Secondary | ICD-10-CM | POA: Diagnosis not present

## 2019-12-13 DIAGNOSIS — K219 Gastro-esophageal reflux disease without esophagitis: Secondary | ICD-10-CM | POA: Diagnosis not present

## 2019-12-13 DIAGNOSIS — I5032 Chronic diastolic (congestive) heart failure: Secondary | ICD-10-CM | POA: Diagnosis not present

## 2019-12-13 DIAGNOSIS — I255 Ischemic cardiomyopathy: Secondary | ICD-10-CM | POA: Diagnosis not present

## 2019-12-16 DIAGNOSIS — E782 Mixed hyperlipidemia: Secondary | ICD-10-CM | POA: Diagnosis not present

## 2019-12-16 DIAGNOSIS — E038 Other specified hypothyroidism: Secondary | ICD-10-CM | POA: Diagnosis not present

## 2019-12-16 DIAGNOSIS — I5032 Chronic diastolic (congestive) heart failure: Secondary | ICD-10-CM | POA: Diagnosis not present

## 2019-12-16 DIAGNOSIS — I255 Ischemic cardiomyopathy: Secondary | ICD-10-CM | POA: Diagnosis not present

## 2019-12-16 DIAGNOSIS — I10 Essential (primary) hypertension: Secondary | ICD-10-CM | POA: Diagnosis not present

## 2019-12-16 DIAGNOSIS — K219 Gastro-esophageal reflux disease without esophagitis: Secondary | ICD-10-CM | POA: Diagnosis not present

## 2019-12-17 DIAGNOSIS — E038 Other specified hypothyroidism: Secondary | ICD-10-CM | POA: Diagnosis not present

## 2019-12-17 DIAGNOSIS — I5032 Chronic diastolic (congestive) heart failure: Secondary | ICD-10-CM | POA: Diagnosis not present

## 2019-12-17 DIAGNOSIS — K219 Gastro-esophageal reflux disease without esophagitis: Secondary | ICD-10-CM | POA: Diagnosis not present

## 2019-12-17 DIAGNOSIS — I255 Ischemic cardiomyopathy: Secondary | ICD-10-CM | POA: Diagnosis not present

## 2019-12-17 DIAGNOSIS — I10 Essential (primary) hypertension: Secondary | ICD-10-CM | POA: Diagnosis not present

## 2019-12-17 DIAGNOSIS — E782 Mixed hyperlipidemia: Secondary | ICD-10-CM | POA: Diagnosis not present

## 2019-12-18 ENCOUNTER — Ambulatory Visit: Payer: Medicare Other | Admitting: Cardiology

## 2019-12-19 DIAGNOSIS — E559 Vitamin D deficiency, unspecified: Secondary | ICD-10-CM | POA: Diagnosis not present

## 2019-12-19 DIAGNOSIS — D518 Other vitamin B12 deficiency anemias: Secondary | ICD-10-CM | POA: Diagnosis not present

## 2019-12-19 DIAGNOSIS — K219 Gastro-esophageal reflux disease without esophagitis: Secondary | ICD-10-CM | POA: Diagnosis not present

## 2019-12-19 DIAGNOSIS — I255 Ischemic cardiomyopathy: Secondary | ICD-10-CM | POA: Diagnosis not present

## 2019-12-19 DIAGNOSIS — E038 Other specified hypothyroidism: Secondary | ICD-10-CM | POA: Diagnosis not present

## 2019-12-19 DIAGNOSIS — I10 Essential (primary) hypertension: Secondary | ICD-10-CM | POA: Diagnosis not present

## 2019-12-19 DIAGNOSIS — I5032 Chronic diastolic (congestive) heart failure: Secondary | ICD-10-CM | POA: Diagnosis not present

## 2019-12-19 DIAGNOSIS — E782 Mixed hyperlipidemia: Secondary | ICD-10-CM | POA: Diagnosis not present

## 2019-12-19 NOTE — Progress Notes (Deleted)
Cardiology Office Note:    Date:  12/20/2019   ID:  Caleb Lyons, DOB November 25, 1929, MRN [id number]  PCP:  No primary care provider on file.  Cardiologist:  Shirlee More, MD    Referring MD: No ref. provider found    ASSESSMENT:    1. Coronary artery disease of native heart with stable angina pectoris, unspecified vessel or lesion type ([place])   2. Ischemic cardiomyopathy   3. Chronic combined systolic and diastolic heart failure ([place])   4. Complete heart block (HCC)   5. Paroxysmal atrial fibrillation ([place])   6. Essential hypertension   7. Mixed hyperlipidemia   8. Chronic anticoagulation    PLAN:    In order of problems listed above:  1. ***   Next appointment: ***   Medication Adjustments/Labs and Tests Ordered: Current medicines are reviewed at length with the patient today.  Concerns regarding medicines are outlined above.  No orders of the defined types were placed in this encounter.  No orders of the defined types were placed in this encounter.   Chief Complaint  Patient presents with  . Follow-up    History of Present Illness:    Caleb Lyons is a 86 y.o. male with a hx of complete heart block status post Saint Jude dual-chamber ICD, hypertension, hyperlipidemia, coronary artery disease status post CABG, ischemic cardiomyopathy, OSA, atrial fibrillation, sinus node dysfunction, and type 2 diabetes. He had a cardioversion 05/13/2019 for atrial fibrillation.  He was last seen 08/05/2019 by Dr Curt Bears. Compliance with diet, lifestyle and medications: ***  Additional studies/ records that were reviewed today include: TTE 2017 Review of the above records today demonstrates:  - Left ventricle: The cavity size was normal. There was mild   concentric hypertrophy. Systolic function was normal. The   estimated ejection fraction was in the range of 50% to 55%. There   is akinesis of the distal septal and apical myocardium. There is   akinesis of the apicalanterior  myocardium. There was a reduced   contribution of atrial contraction to ventricular filling, due to   increased ventricular diastolic pressure or atrial contractile   dysfunction. Features are consistent with a pseudonormal left   ventricular filling pattern, with concomitant abnormal relaxation   and increased filling pressure (grade 2 diastolic dysfunction).   Doppler parameters are consistent with high ventricular filling   pressure. - Ventricular septum: Septal motion showed paradox. These changes   are consistent with right ventricular pacing. - Aortic valve: Moderate diffuse thickening and calcification,   consistent with sclerosis. - Aorta: Aortic root dimension: 39 mm (ED). - Aortic root: The aortic root was mildly dilated. - Mitral valve: Calcified annulus. - Pulmonary arteries: PA peak pressure: 35 mm Hg (S).  Past Medical History:  Diagnosis Date  . Complete heart block ([place])   . Dual implantable cardiac defibrillator in situ    St Judes  . History of chicken pox   . HLD (hyperlipidemia)   . HTN (hypertension)   . Hyperparathyroidism   . Impotence of organic origin   . Ischemic cardiomyopathy    s/pCABG, Graft patent 3/11; EF 30-35%  . Obesity       . OSA (obstructive sleep apnea)   . PAF (paroxysmal atrial fibrillation) ([place])   . Sinus node dysfunction (HCC)   . Type II or unspecified type diabetes mellitus without mention of complication, not stated as uncontrolled     Past Surgical History:  Procedure Laterality Date  . CARDIOVERSION N/A  05/13/2019   Procedure: CARDIOVERSION;  Surgeon: Fay Records, MD;  Location: [facility] ENDOSCOPY;  Service: Cardiovascular;  Laterality: N/A;  . CORONARY ARTERY BYPASS GRAFT    . ICD GENERATOR CHANGEOUT N/A 01/18/2019   Procedure: ICD GENERATOR CHANGEOUT - Dual Chamber;  Surgeon: Deboraha Sprang, MD;  Location: [place] [facility] CV LAB;  Service: Cardiovascular;  Laterality: N/A;  . TONSILLECTOMY      Current Medications: Current Meds   Medication Sig  . allopurinol (ZYLOPRIM) 100 MG tablet Take 100 mg by mouth every evening.   Marland Kitchen apixaban (ELIQUIS) 5 MG TABS tablet Take 1 tablet (5 mg total) by mouth 2 (two) times daily.  Marland Kitchen atorvastatin (LIPITOR) 40 MG tablet Take 40 mg by mouth every evening.   . cinacalcet (SENSIPAR) 30 MG tablet Take 1 tablet (30 mg total) by mouth daily with breakfast.  . COLCRYS 0.6 MG tablet Take 0.6 mg by mouth daily.  . furosemide (LASIX) 20 MG tablet Take 20 mg by mouth 2 (two) times daily.  Marland Kitchen GLUCOSAMINE-CHONDROITIN PO Take 1 tablet by mouth 2 (two) times a day.  . levothyroxine (SYNTHROID, LEVOTHROID) 200 MCG tablet Take 200 mcg by mouth daily before breakfast.  . Menthol, Topical Analgesic, (BENGAY EX) Apply 1 application topically daily as needed (pain).  . metoprolol (TOPROL-XL) 50 MG 24 hr tablet Take 50 mg by mouth every evening.   . Misc Natural Products (PROSTATE SUPPORT PO) Take 600 mg by mouth 2 (two) times daily. Super Beta Prostate   . Multiple Vitamin (MULTIVITAMIN WITH MINERALS) TABS tablet Take 1 tablet by mouth daily.  Marland Kitchen omeprazole (PRILOSEC) 20 MG capsule Take 20 mg by mouth daily.  Marland Kitchen oxymetazoline (AFRIN) 0.05 % nasal spray Place 1 spray into both nostrils at bedtime.  . potassium chloride (K-DUR) 10 MEQ tablet Take 10 mEq by mouth 2 (two) times daily.  . valsartan-hydrochlorothiazide (DIOVAN-HCT) 160-12.5 MG tablet Take 1 tablet by mouth daily.  . vitamin C (ASCORBIC ACID) 500 MG tablet Take 500 mg by mouth every evening.      Allergies:   Patient has no known allergies.   Social History   Socioeconomic History  . Marital status: Married    Spouse name: Not on file  . Number of children: Not on file  . Years of education: 63  . Highest education level: Not on file  Occupational History  . Occupation: Retired    Fish farm manager: RETIRED  Tobacco Use  . Smoking status: Never Smoker  . Smokeless tobacco: Never Used  Substance and Sexual Activity  . Alcohol use: Yes  . Drug  use: No  . Sexual activity: Not Currently  Other Topics Concern  . Not on file  Social History Narrative   Regular exercise-yes   Social Determinants of Health   Financial Resource Strain:   . Difficulty of Paying Living Expenses: Not on file  Food Insecurity:   . Worried About Charity fundraiser in the Last Year: Not on file  . Ran Out of Food in the Last Year: Not on file  Transportation Needs:   . Lack of Transportation (Medical): Not on file  . Lack of Transportation (Non-Medical): Not on file  Physical Activity:   . Days of Exercise per Week: Not on file  . Minutes of Exercise per Session: Not on file  Stress:   . Feeling of Stress : Not on file  Social Connections:   . Frequency of Communication with Friends and Family: Not on file  .  Frequency of Social Gatherings with Friends and Family: Not on file  . Attends Religious Services: Not on file  . Active Member of Clubs or Organizations: Not on file  . Attends Archivist Meetings: Not on file  . Marital Status: Not on file     Family History: The patient's ***family history includes Early death in his father; Stroke in his father. ROS:   Please see the history of present illness.    All other systems reviewed and are negative.  EKGs/Labs/Other Studies Reviewed:    The following studies were reviewed today:  EKG:  EKG ordered today and personally reviewed.  The ekg ordered today demonstrates ***  Recent Labs: 05/13/2019: Hemoglobin 16.4; Platelets 179 05/21/2019: BUN 12; Creatinine, Ser 1.09; Potassium 4.1; Sodium 141  Recent Lipid Panel    Component Value Date/Time   CHOL 107 06/06/2016 0444   TRIG 214 (H) 06/06/2016 0444   HDL 28 (L) 06/06/2016 0444   CHOLHDL 3.8 06/06/2016 0444   VLDL 43 (H) 06/06/2016 0444   LDLCALC 36 06/06/2016 0444    Physical Exam:    VS:  Ht 6\' 1"  (1.854 m)   Wt (!) 448 lb (203.2 kg)   BMI 59.11 kg/m     Wt Readings from Last 3 Encounters:  12/20/19 (!) 448 lb  (203.2 kg)  08/05/19 (!) 448 lb (203.2 kg)  01/18/19 (!) 400 lb (181.4 kg)     GEN: *** Well nourished, well developed in no acute distress HEENT: Normal NECK: No JVD; No carotid bruits LYMPHATICS: No lymphadenopathy CARDIAC: ***RRR, no murmurs, rubs, gallops RESPIRATORY:  Clear to auscultation without rales, wheezing or rhonchi  ABDOMEN: Soft, non-tender, non-distended MUSCULOSKELETAL:  No edema; No deformity  SKIN: Warm and dry NEUROLOGIC:  Alert and oriented x 3 PSYCHIATRIC:  Normal affect    Signed, Shirlee More, MD  12/20/2019 10:37 AM    [place]

## 2019-12-20 ENCOUNTER — Telehealth (INDEPENDENT_AMBULATORY_CARE_PROVIDER_SITE_OTHER): Payer: TRICARE For Life (TFL) | Admitting: Cardiology

## 2019-12-20 ENCOUNTER — Telehealth: Payer: Self-pay | Admitting: Cardiology

## 2019-12-20 ENCOUNTER — Encounter: Payer: Self-pay | Admitting: Cardiology

## 2019-12-20 VITALS — Ht 73.0 in | Wt >= 6400 oz

## 2019-12-20 DIAGNOSIS — Z7901 Long term (current) use of anticoagulants: Secondary | ICD-10-CM

## 2019-12-20 DIAGNOSIS — I442 Atrioventricular block, complete: Secondary | ICD-10-CM

## 2019-12-20 DIAGNOSIS — I255 Ischemic cardiomyopathy: Secondary | ICD-10-CM | POA: Diagnosis not present

## 2019-12-20 DIAGNOSIS — I10 Essential (primary) hypertension: Secondary | ICD-10-CM

## 2019-12-20 DIAGNOSIS — K219 Gastro-esophageal reflux disease without esophagitis: Secondary | ICD-10-CM | POA: Diagnosis not present

## 2019-12-20 DIAGNOSIS — E038 Other specified hypothyroidism: Secondary | ICD-10-CM | POA: Diagnosis not present

## 2019-12-20 DIAGNOSIS — E782 Mixed hyperlipidemia: Secondary | ICD-10-CM | POA: Diagnosis not present

## 2019-12-20 DIAGNOSIS — I480 Paroxysmal atrial fibrillation: Secondary | ICD-10-CM

## 2019-12-20 DIAGNOSIS — I5042 Chronic combined systolic (congestive) and diastolic (congestive) heart failure: Secondary | ICD-10-CM

## 2019-12-20 DIAGNOSIS — I5032 Chronic diastolic (congestive) heart failure: Secondary | ICD-10-CM | POA: Diagnosis not present

## 2019-12-20 DIAGNOSIS — I25118 Atherosclerotic heart disease of native coronary artery with other forms of angina pectoris: Secondary | ICD-10-CM

## 2019-12-20 NOTE — Telephone Encounter (Signed)
Patient's wife called in to advise Dr. Bettina Gavia and his nurse that their internet with [employer] has gone out. She gave a new number to call - updated in the appt notes. May not be able to do face to face video chat.

## 2019-12-20 NOTE — Telephone Encounter (Signed)
Multiple attempts to contact patient all numbers busy/unavailable.  Left message for patient to call back.

## 2019-12-20 NOTE — Progress Notes (Deleted)
Cardiology Office Note:    Date:  12/21/2019   ID:  Caleb Lyons, DOB Oct 17, 1930, MRN [id number]  PCP:  No primary care provider on file.  Cardiologist:  Shirlee More, MD    Referring MD: No ref. provider found   We have called him twice this morning and he does not answer the phone or his virtual visit  ASSESSMENT:    1. Chronic combined systolic and diastolic heart failure ([place])   2. Hypertensive heart disease with chronic combined systolic and diastolic congestive heart failure ([place])   3. Complete heart block ([place])   4. ICD (implantable cardioverter-defibrillator) in place   5. Chronic anticoagulation   6. Atrial flutter, unspecified type ([place])    PLAN:    In order of problems listed above:  1. ***   Next appointment: ***   Medication Adjustments/Labs and Tests Ordered: Current medicines are reviewed at length with the patient today.  Concerns regarding medicines are outlined above.  No orders of the defined types were placed in this encounter.  No orders of the defined types were placed in this encounter.   No chief complaint on file.   History of Present Illness:    Caleb Lyons is a 86 y.o. male with a hx of complete heart block status post Saint Jude dual-chamber ICD, hypertension, hyperlipidemia, coronary artery disease status post CABG, ischemic cardiomyopathy, OSA, atrial fibrillation, sinus node dysfunction, and type 2 diabetes. He had a cardioversion 05/13/2019 for atrial fibrillation.  He was last seen 07/17/2019 Dr Curt Bears. Compliance with diet, lifestyle and medications: ***  He was seen by Dr. Agustin Cree and Dr. Harriet Masson while at [facility] in November at that time he had decompensated heart failure and has been in persistent atrial flutter since September was initiated on oral amiodarone and continued on anticoagulation he had an element of acute renal failure related to motor outlet obstruction required a Foley catheter.  Studies during that  admission to the hospital include a chest x-ray showed heart failure echocardiogram showed EF of 55 to 60% the right ventricle is not well visualized.  Prior to discharge hemoglobin is 13.7 creatinine 1.20.  Had a low level static troponin elevation that was nonspecific proBNP level was elevated at 3390 EKG 10/11/2019 independently reviewed showed atrial flutter with variable conduction. Past Medical History:  Diagnosis Date  . Complete heart block ([place])   . Dual implantable cardiac defibrillator in situ    [employer] Judes  . History of chicken pox   . HLD (hyperlipidemia)   . HTN (hypertension)   . Hyperparathyroidism   . Impotence of organic origin   . Ischemic cardiomyopathy    s/pCABG, Graft patent 3/11; EF 30-35%  . Obesity       . OSA (obstructive sleep apnea)   . PAF (paroxysmal atrial fibrillation) ([place])   . Sinus node dysfunction (HCC)   . Type II or unspecified type diabetes mellitus without mention of complication, not stated as uncontrolled     Past Surgical History:  Procedure Laterality Date  . CARDIOVERSION N/A 05/13/2019   Procedure: CARDIOVERSION;  Surgeon: Fay Records, MD;  Location: [facility] ENDOSCOPY;  Service: Cardiovascular;  Laterality: N/A;  . CORONARY ARTERY BYPASS GRAFT    . ICD GENERATOR CHANGEOUT N/A 01/18/2019   Procedure: ICD GENERATOR CHANGEOUT - Dual Chamber;  Surgeon: Deboraha Sprang, MD;  Location: [place] CV LAB;  Service: Cardiovascular;  Laterality: N/A;  . TONSILLECTOMY      Current Medications: No outpatient  medications have been marked as taking for the 12/23/19 encounter (Appointment) with Richardo Priest, MD.     Allergies:   Patient has no known allergies.   Social History   Socioeconomic History  . Marital status: Married    Spouse name: Not on file  . Number of children: Not on file  . Years of education: 52  . Highest education level: Not on file  Occupational History  . Occupation: Retired    Fish farm manager: RETIRED  Tobacco Use  .  Smoking status: Never Smoker  . Smokeless tobacco: Never Used  Substance and Sexual Activity  . Alcohol use: Yes  . Drug use: No  . Sexual activity: Not Currently  Other Topics Concern  . Not on file  Social History Narrative   Regular exercise-yes   Social Determinants of Health   Financial Resource Strain:   . Difficulty of Paying Living Expenses: Not on file  Food Insecurity:   . Worried About Charity fundraiser in the Last Year: Not on file  . Ran Out of Food in the Last Year: Not on file  Transportation Needs:   . Lack of Transportation (Medical): Not on file  . Lack of Transportation (Non-Medical): Not on file  Physical Activity:   . Days of Exercise per Week: Not on file  . Minutes of Exercise per Session: Not on file  Stress:   . Feeling of Stress : Not on file  Social Connections:   . Frequency of Communication with Friends and Family: Not on file  . Frequency of Social Gatherings with Friends and Family: Not on file  . Attends Religious Services: Not on file  . Active Member of Clubs or Organizations: Not on file  . Attends Archivist Meetings: Not on file  . Marital Status: Not on file     Family History: The patient's ***family history includes Early death in his father; Stroke in his father. ROS:   Please see the history of present illness.    All other systems reviewed and are negative.  EKGs/Labs/Other Studies Reviewed:    The following studies were reviewed today:  EKG:  EKG ordered today and personally reviewed.  The ekg ordered today demonstrates ***  Recent Labs: 05/13/2019: Hemoglobin 16.4; Platelets 179 05/21/2019: BUN 12; Creatinine, Ser 1.09; Potassium 4.1; Sodium 141  Recent Lipid Panel    Component Value Date/Time   CHOL 107 06/06/2016 0444   TRIG 214 (H) 06/06/2016 0444   HDL 28 (L) 06/06/2016 0444   CHOLHDL 3.8 06/06/2016 0444   VLDL 43 (H) 06/06/2016 0444   LDLCALC 36 06/06/2016 0444    Physical Exam:    VS:  There  were no vitals taken for this visit.    Wt Readings from Last 3 Encounters:  12/20/19 (!) 448 lb (203.2 kg)  08/05/19 (!) 448 lb (203.2 kg)  01/18/19 (!) 400 lb (181.4 kg)     GEN: *** Well nourished, well developed in no acute distress HEENT: Normal NECK: No JVD; No carotid bruits LYMPHATICS: No lymphadenopathy CARDIAC: ***RRR, no murmurs, rubs, gallops RESPIRATORY:  Clear to auscultation without rales, wheezing or rhonchi  ABDOMEN: Soft, non-tender, non-distended MUSCULOSKELETAL:  No edema; No deformity  SKIN: Warm and dry NEUROLOGIC:  Alert and oriented x 3 PSYCHIATRIC:  Normal affect    Signed, Shirlee More, MD  12/21/2019 12:15 PM    [facility]

## 2019-12-23 ENCOUNTER — Telehealth (INDEPENDENT_AMBULATORY_CARE_PROVIDER_SITE_OTHER): Payer: TRICARE For Life (TFL) | Admitting: Cardiology

## 2019-12-23 ENCOUNTER — Other Ambulatory Visit: Payer: Self-pay

## 2019-12-23 ENCOUNTER — Telehealth: Payer: Self-pay

## 2019-12-23 ENCOUNTER — Telehealth: Payer: Self-pay | Admitting: Cardiology

## 2019-12-23 DIAGNOSIS — G4733 Obstructive sleep apnea (adult) (pediatric): Secondary | ICD-10-CM | POA: Diagnosis not present

## 2019-12-23 DIAGNOSIS — E038 Other specified hypothyroidism: Secondary | ICD-10-CM | POA: Diagnosis not present

## 2019-12-23 DIAGNOSIS — I5032 Chronic diastolic (congestive) heart failure: Secondary | ICD-10-CM | POA: Diagnosis not present

## 2019-12-23 DIAGNOSIS — I4892 Unspecified atrial flutter: Secondary | ICD-10-CM

## 2019-12-23 DIAGNOSIS — M1000 Idiopathic gout, unspecified site: Secondary | ICD-10-CM | POA: Diagnosis not present

## 2019-12-23 DIAGNOSIS — I5042 Chronic combined systolic (congestive) and diastolic (congestive) heart failure: Secondary | ICD-10-CM

## 2019-12-23 DIAGNOSIS — Z5329 Procedure and treatment not carried out because of patient's decision for other reasons: Secondary | ICD-10-CM

## 2019-12-23 DIAGNOSIS — I10 Essential (primary) hypertension: Secondary | ICD-10-CM | POA: Diagnosis not present

## 2019-12-23 DIAGNOSIS — I480 Paroxysmal atrial fibrillation: Secondary | ICD-10-CM | POA: Diagnosis not present

## 2019-12-23 DIAGNOSIS — I255 Ischemic cardiomyopathy: Secondary | ICD-10-CM | POA: Diagnosis not present

## 2019-12-23 DIAGNOSIS — Z7901 Long term (current) use of anticoagulants: Secondary | ICD-10-CM

## 2019-12-23 DIAGNOSIS — Z95810 Presence of automatic (implantable) cardiac defibrillator: Secondary | ICD-10-CM

## 2019-12-23 DIAGNOSIS — K219 Gastro-esophageal reflux disease without esophagitis: Secondary | ICD-10-CM | POA: Diagnosis not present

## 2019-12-23 DIAGNOSIS — I110 Hypertensive heart disease with heart failure: Secondary | ICD-10-CM

## 2019-12-23 DIAGNOSIS — E782 Mixed hyperlipidemia: Secondary | ICD-10-CM | POA: Diagnosis not present

## 2019-12-23 DIAGNOSIS — R338 Other retention of urine: Secondary | ICD-10-CM | POA: Diagnosis not present

## 2019-12-23 DIAGNOSIS — I442 Atrioventricular block, complete: Secondary | ICD-10-CM

## 2019-12-23 NOTE — Telephone Encounter (Signed)
New Message    Pts wife is calling about the Virtual appt today  She would like for [facility] to call her back    Please call

## 2019-12-23 NOTE — Progress Notes (Signed)
He had no Internet access no ability to measure vital signs at home and at that point I did not have records of her recent hospitalization and we decided to reschedule when you could do a virtual visit blood pressure heart rate readings records have been reviewed.

## 2019-12-23 NOTE — Telephone Encounter (Signed)
Called the patient to get him prepared for his my chart virtual visit. No answer. LMOM to return the call prior to his appointment, Patient never showed for his appointment.

## 2019-12-24 NOTE — Progress Notes (Signed)
No show for virtual visit  

## 2019-12-30 DIAGNOSIS — E559 Vitamin D deficiency, unspecified: Secondary | ICD-10-CM | POA: Diagnosis not present

## 2019-12-30 DIAGNOSIS — E782 Mixed hyperlipidemia: Secondary | ICD-10-CM | POA: Diagnosis not present

## 2019-12-30 DIAGNOSIS — E038 Other specified hypothyroidism: Secondary | ICD-10-CM | POA: Diagnosis not present

## 2019-12-30 DIAGNOSIS — I10 Essential (primary) hypertension: Secondary | ICD-10-CM | POA: Diagnosis not present

## 2020-01-05 DIAGNOSIS — K219 Gastro-esophageal reflux disease without esophagitis: Secondary | ICD-10-CM | POA: Diagnosis not present

## 2020-01-05 DIAGNOSIS — I10 Essential (primary) hypertension: Secondary | ICD-10-CM | POA: Diagnosis not present

## 2020-01-05 DIAGNOSIS — E038 Other specified hypothyroidism: Secondary | ICD-10-CM | POA: Diagnosis not present

## 2020-01-05 DIAGNOSIS — E782 Mixed hyperlipidemia: Secondary | ICD-10-CM | POA: Diagnosis not present

## 2020-01-05 DIAGNOSIS — I255 Ischemic cardiomyopathy: Secondary | ICD-10-CM | POA: Diagnosis not present

## 2020-01-05 DIAGNOSIS — I5032 Chronic diastolic (congestive) heart failure: Secondary | ICD-10-CM | POA: Diagnosis not present

## 2020-01-07 DIAGNOSIS — R0789 Other chest pain: Secondary | ICD-10-CM | POA: Diagnosis not present

## 2020-01-07 DIAGNOSIS — R531 Weakness: Secondary | ICD-10-CM | POA: Diagnosis not present

## 2020-01-07 DIAGNOSIS — R0602 Shortness of breath: Secondary | ICD-10-CM | POA: Diagnosis not present

## 2020-01-07 DIAGNOSIS — R079 Chest pain, unspecified: Secondary | ICD-10-CM | POA: Diagnosis not present

## 2020-01-07 DIAGNOSIS — R0902 Hypoxemia: Secondary | ICD-10-CM | POA: Diagnosis not present

## 2020-01-07 DIAGNOSIS — I959 Hypotension, unspecified: Secondary | ICD-10-CM | POA: Diagnosis not present

## 2020-01-07 DIAGNOSIS — K8020 Calculus of gallbladder without cholecystitis without obstruction: Secondary | ICD-10-CM | POA: Diagnosis not present

## 2020-01-08 DIAGNOSIS — A499 Bacterial infection, unspecified: Secondary | ICD-10-CM | POA: Diagnosis not present

## 2020-01-08 DIAGNOSIS — R079 Chest pain, unspecified: Secondary | ICD-10-CM | POA: Diagnosis not present

## 2020-01-08 DIAGNOSIS — E860 Dehydration: Secondary | ICD-10-CM | POA: Diagnosis not present

## 2020-01-08 DIAGNOSIS — E872 Acidosis: Secondary | ICD-10-CM | POA: Diagnosis not present

## 2020-01-08 DIAGNOSIS — I5032 Chronic diastolic (congestive) heart failure: Secondary | ICD-10-CM | POA: Diagnosis not present

## 2020-01-09 ENCOUNTER — Inpatient Hospital Stay (HOSPITAL_COMMUNITY)
Admission: AD | Admit: 2020-01-09 | Discharge: 2020-01-17 | DRG: 682 | Disposition: A | Discharge status: Hospice | Payer: Medicare Other | Source: Other Acute Inpatient Hospital | Attending: Family Medicine | Admitting: Family Medicine

## 2020-01-09 DIAGNOSIS — E872 Acidosis: Secondary | ICD-10-CM | POA: Diagnosis present

## 2020-01-09 DIAGNOSIS — Z823 Family history of stroke: Secondary | ICD-10-CM

## 2020-01-09 DIAGNOSIS — Z951 Presence of aortocoronary bypass graft: Secondary | ICD-10-CM

## 2020-01-09 DIAGNOSIS — R310 Gross hematuria: Secondary | ICD-10-CM | POA: Diagnosis present

## 2020-01-09 DIAGNOSIS — N401 Enlarged prostate with lower urinary tract symptoms: Secondary | ICD-10-CM | POA: Diagnosis not present

## 2020-01-09 DIAGNOSIS — R5381 Other malaise: Secondary | ICD-10-CM

## 2020-01-09 DIAGNOSIS — E039 Hypothyroidism, unspecified: Secondary | ICD-10-CM | POA: Diagnosis present

## 2020-01-09 DIAGNOSIS — I4819 Other persistent atrial fibrillation: Secondary | ICD-10-CM | POA: Diagnosis not present

## 2020-01-09 DIAGNOSIS — E860 Dehydration: Secondary | ICD-10-CM | POA: Diagnosis present

## 2020-01-09 DIAGNOSIS — I132 Hypertensive heart and chronic kidney disease with heart failure and with stage 5 chronic kidney disease, or end stage renal disease: Secondary | ICD-10-CM | POA: Diagnosis not present

## 2020-01-09 DIAGNOSIS — Z8673 Personal history of transient ischemic attack (TIA), and cerebral infarction without residual deficits: Secondary | ICD-10-CM | POA: Diagnosis not present

## 2020-01-09 DIAGNOSIS — I5032 Chronic diastolic (congestive) heart failure: Secondary | ICD-10-CM | POA: Diagnosis not present

## 2020-01-09 DIAGNOSIS — I484 Atypical atrial flutter: Secondary | ICD-10-CM | POA: Diagnosis not present

## 2020-01-09 DIAGNOSIS — E213 Hyperparathyroidism, unspecified: Secondary | ICD-10-CM | POA: Diagnosis not present

## 2020-01-09 DIAGNOSIS — R197 Diarrhea, unspecified: Secondary | ICD-10-CM | POA: Diagnosis present

## 2020-01-09 DIAGNOSIS — I5043 Acute on chronic combined systolic (congestive) and diastolic (congestive) heart failure: Secondary | ICD-10-CM | POA: Diagnosis present

## 2020-01-09 DIAGNOSIS — Z5320 Procedure and treatment not carried out because of patient's decision for unspecified reasons: Secondary | ICD-10-CM | POA: Diagnosis not present

## 2020-01-09 DIAGNOSIS — I4891 Unspecified atrial fibrillation: Secondary | ICD-10-CM | POA: Diagnosis not present

## 2020-01-09 DIAGNOSIS — N400 Enlarged prostate without lower urinary tract symptoms: Secondary | ICD-10-CM | POA: Diagnosis present

## 2020-01-09 DIAGNOSIS — M6282 Rhabdomyolysis: Secondary | ICD-10-CM | POA: Diagnosis present

## 2020-01-09 DIAGNOSIS — E785 Hyperlipidemia, unspecified: Secondary | ICD-10-CM | POA: Diagnosis present

## 2020-01-09 DIAGNOSIS — Z7401 Bed confinement status: Secondary | ICD-10-CM | POA: Diagnosis not present

## 2020-01-09 DIAGNOSIS — I130 Hypertensive heart and chronic kidney disease with heart failure and stage 1 through stage 4 chronic kidney disease, or unspecified chronic kidney disease: Secondary | ICD-10-CM | POA: Diagnosis present

## 2020-01-09 DIAGNOSIS — Z515 Encounter for palliative care: Secondary | ICD-10-CM | POA: Diagnosis present

## 2020-01-09 DIAGNOSIS — I255 Ischemic cardiomyopathy: Secondary | ICD-10-CM | POA: Diagnosis not present

## 2020-01-09 DIAGNOSIS — G4733 Obstructive sleep apnea (adult) (pediatric): Secondary | ICD-10-CM | POA: Diagnosis present

## 2020-01-09 DIAGNOSIS — E876 Hypokalemia: Secondary | ICD-10-CM | POA: Diagnosis present

## 2020-01-09 DIAGNOSIS — Z95810 Presence of automatic (implantable) cardiac defibrillator: Secondary | ICD-10-CM

## 2020-01-09 DIAGNOSIS — I480 Paroxysmal atrial fibrillation: Secondary | ICD-10-CM | POA: Diagnosis present

## 2020-01-09 DIAGNOSIS — E669 Obesity, unspecified: Secondary | ICD-10-CM | POA: Diagnosis present

## 2020-01-09 DIAGNOSIS — Z79899 Other long term (current) drug therapy: Secondary | ICD-10-CM

## 2020-01-09 DIAGNOSIS — R319 Hematuria, unspecified: Secondary | ICD-10-CM | POA: Diagnosis not present

## 2020-01-09 DIAGNOSIS — N390 Urinary tract infection, site not specified: Secondary | ICD-10-CM | POA: Diagnosis present

## 2020-01-09 DIAGNOSIS — N184 Chronic kidney disease, stage 4 (severe): Secondary | ICD-10-CM

## 2020-01-09 DIAGNOSIS — E871 Hypo-osmolality and hyponatremia: Secondary | ICD-10-CM | POA: Diagnosis not present

## 2020-01-09 DIAGNOSIS — I4892 Unspecified atrial flutter: Secondary | ICD-10-CM | POA: Diagnosis not present

## 2020-01-09 DIAGNOSIS — I495 Sick sinus syndrome: Secondary | ICD-10-CM | POA: Diagnosis present

## 2020-01-09 DIAGNOSIS — E1122 Type 2 diabetes mellitus with diabetic chronic kidney disease: Secondary | ICD-10-CM | POA: Diagnosis present

## 2020-01-09 DIAGNOSIS — N170 Acute kidney failure with tubular necrosis: Principal | ICD-10-CM | POA: Diagnosis present

## 2020-01-09 DIAGNOSIS — I6529 Occlusion and stenosis of unspecified carotid artery: Secondary | ICD-10-CM | POA: Diagnosis not present

## 2020-01-09 DIAGNOSIS — L8990 Pressure ulcer of unspecified site, unspecified stage: Secondary | ICD-10-CM | POA: Diagnosis present

## 2020-01-09 DIAGNOSIS — I442 Atrioventricular block, complete: Secondary | ICD-10-CM | POA: Diagnosis not present

## 2020-01-09 DIAGNOSIS — Z20822 Contact with and (suspected) exposure to covid-19: Secondary | ICD-10-CM | POA: Diagnosis present

## 2020-01-09 DIAGNOSIS — Z6841 Body Mass Index (BMI) 40.0 and over, adult: Secondary | ICD-10-CM

## 2020-01-09 DIAGNOSIS — I10 Essential (primary) hypertension: Secondary | ICD-10-CM | POA: Diagnosis not present

## 2020-01-09 DIAGNOSIS — Z66 Do not resuscitate: Secondary | ICD-10-CM

## 2020-01-09 DIAGNOSIS — E1159 Type 2 diabetes mellitus with other circulatory complications: Secondary | ICD-10-CM | POA: Diagnosis present

## 2020-01-09 DIAGNOSIS — N179 Acute kidney failure, unspecified: Secondary | ICD-10-CM

## 2020-01-09 DIAGNOSIS — R062 Wheezing: Secondary | ICD-10-CM

## 2020-01-09 DIAGNOSIS — R7989 Other specified abnormal findings of blood chemistry: Secondary | ICD-10-CM | POA: Diagnosis not present

## 2020-01-09 DIAGNOSIS — Z7901 Long term (current) use of anticoagulants: Secondary | ICD-10-CM

## 2020-01-09 DIAGNOSIS — R531 Weakness: Secondary | ICD-10-CM | POA: Diagnosis not present

## 2020-01-09 DIAGNOSIS — M2550 Pain in unspecified joint: Secondary | ICD-10-CM | POA: Diagnosis not present

## 2020-01-09 DIAGNOSIS — Z79890 Hormone replacement therapy (postmenopausal): Secondary | ICD-10-CM

## 2020-01-09 DIAGNOSIS — N185 Chronic kidney disease, stage 5: Secondary | ICD-10-CM | POA: Diagnosis not present

## 2020-01-09 DIAGNOSIS — R918 Other nonspecific abnormal finding of lung field: Secondary | ICD-10-CM | POA: Diagnosis not present

## 2020-01-09 DIAGNOSIS — I25119 Atherosclerotic heart disease of native coronary artery with unspecified angina pectoris: Secondary | ICD-10-CM | POA: Diagnosis not present

## 2020-01-09 LAB — CBC
HCT: 40.2 % (ref 39.0–52.0)
Hemoglobin: 13.2 g/dL (ref 13.0–17.0)
MCH: 32.4 pg (ref 26.0–34.0)
MCHC: 32.8 g/dL (ref 30.0–36.0)
MCV: 98.5 fL (ref 80.0–100.0)
Platelets: 195 10*3/uL (ref 150–400)
RBC: 4.08 MIL/uL — ABNORMAL LOW (ref 4.22–5.81)
RDW: 17.3 % — ABNORMAL HIGH (ref 11.5–15.5)
WBC: 11.8 10*3/uL — ABNORMAL HIGH (ref 4.0–10.5)
nRBC: 0 % (ref 0.0–0.2)

## 2020-01-09 LAB — URINALYSIS, ROUTINE W REFLEX MICROSCOPIC
Bilirubin Urine: NEGATIVE
Glucose, UA: 150 mg/dL — AB
Ketones, ur: 5 mg/dL — AB
Nitrite: NEGATIVE
Protein, ur: >=300 mg/dL — AB
Specific Gravity, Urine: 1.017 (ref 1.005–1.030)
pH: 9 — ABNORMAL HIGH (ref 5.0–8.0)

## 2020-01-09 LAB — CREATININE, URINE, RANDOM: Creatinine, Urine: <10 mg/dL

## 2020-01-09 LAB — SARS CORONAVIRUS 2 (TAT 6-24 HRS): SARS Coronavirus 2: NEGATIVE

## 2020-01-09 LAB — COMPREHENSIVE METABOLIC PANEL
ALT: 30 U/L (ref 0–44)
AST: 42 U/L — ABNORMAL HIGH (ref 15–41)
Albumin: 2.9 g/dL — ABNORMAL LOW (ref 3.5–5.0)
Alkaline Phosphatase: 76 U/L (ref 38–126)
Anion gap: 17 — ABNORMAL HIGH (ref 5–15)
BUN: 91 mg/dL — ABNORMAL HIGH (ref 8–23)
CO2: 17 mmol/L — ABNORMAL LOW (ref 22–32)
Calcium: 8.5 mg/dL — ABNORMAL LOW (ref 8.9–10.3)
Chloride: 105 mmol/L (ref 98–111)
Creatinine, Ser: 7.88 mg/dL — ABNORMAL HIGH (ref 0.61–1.24)
GFR calc Af Amer: 6 mL/min — ABNORMAL LOW (ref >60)
GFR calc non Af Amer: 6 mL/min — ABNORMAL LOW (ref >60)
Glucose, Bld: 106 mg/dL — ABNORMAL HIGH (ref 70–99)
Potassium: 3.4 mmol/L — ABNORMAL LOW (ref 3.5–5.1)
Sodium: 139 mmol/L (ref 135–145)
Total Bilirubin: 0.7 mg/dL (ref 0.3–1.2)
Total Protein: 5.9 g/dL — ABNORMAL LOW (ref 6.5–8.1)

## 2020-01-09 LAB — LACTIC ACID, PLASMA
Lactic Acid, Venous: 1.6 mmol/L (ref 0.5–1.9)
Lactic Acid, Venous: 1.4 mmol/L (ref 0.5–1.9)

## 2020-01-09 LAB — APTT: aPTT: 118 seconds — ABNORMAL HIGH (ref 24–36)

## 2020-01-09 LAB — ABO/RH: ABO/RH(D): A POS

## 2020-01-09 LAB — CK: Total CK: 838 U/L — ABNORMAL HIGH (ref 49–397)

## 2020-01-09 LAB — SODIUM, URINE, RANDOM: Sodium, Ur: 137 mmol/L

## 2020-01-09 MED ORDER — ATORVASTATIN CALCIUM 40 MG PO TABS: 40.0000 mg | ORAL_TABLET | Freq: Every evening | ORAL | Status: DC

## 2020-01-09 MED ORDER — ACETAMINOPHEN 650 MG RE SUPP: 650.0000 mg | Freq: Four times a day (QID) | RECTAL | Status: DC | PRN

## 2020-01-09 MED ORDER — CINACALCET HCL 30 MG PO TABS
30.0000 mg | ORAL_TABLET | Freq: Every day | ORAL | Status: DC
  Administered 2020-01-10 – 2020-01-17 (×6): 30 mg via ORAL
  Filled 2020-01-09 (×7): qty 1

## 2020-01-09 MED ORDER — SODIUM CHLORIDE 0.9 % IV SOLN
1.0000 g | INTRAVENOUS | Status: AC
  Administered 2020-01-09 – 2020-01-15 (×7): 1 g via INTRAVENOUS
  Filled 2020-01-09 (×5): qty 10
  Filled 2020-01-09: qty 1
  Filled 2020-01-09 (×2): qty 10
  Filled 2020-01-09: qty 1

## 2020-01-09 MED ORDER — FUROSEMIDE 20 MG PO TABS
20.0000 mg | ORAL_TABLET | Freq: Two times a day (BID) | ORAL | Status: DC
  Administered 2020-01-09: 20 mg via ORAL
  Filled 2020-01-09: qty 1

## 2020-01-09 MED ORDER — LEVOTHYROXINE SODIUM 100 MCG PO TABS
200.0000 ug | ORAL_TABLET | Freq: Every day | ORAL | Status: DC
  Administered 2020-01-10 – 2020-01-17 (×8): 200 ug via ORAL
  Filled 2020-01-09 (×8): qty 2

## 2020-01-09 MED ORDER — LACTATED RINGERS IV SOLN: INTRAVENOUS | Status: DC

## 2020-01-09 MED ORDER — ONDANSETRON HCL 4 MG/2ML IJ SOLN: 4.0000 mg | Freq: Four times a day (QID) | INTRAMUSCULAR | Status: DC | PRN

## 2020-01-09 MED ORDER — ACETAMINOPHEN 325 MG PO TABS
650.0000 mg | ORAL_TABLET | Freq: Four times a day (QID) | ORAL | Status: DC | PRN
  Administered 2020-01-09 – 2020-01-13 (×3): 650 mg via ORAL
  Filled 2020-01-09 (×3): qty 2

## 2020-01-09 MED ORDER — HEPARIN SODIUM (PORCINE) 5000 UNIT/ML IJ SOLN: 5000.0000 [IU] | Freq: Three times a day (TID) | INTRAMUSCULAR | Status: DC

## 2020-01-09 MED ORDER — ONDANSETRON HCL 4 MG PO TABS: 4.0000 mg | ORAL_TABLET | Freq: Four times a day (QID) | ORAL | Status: DC | PRN

## 2020-01-09 MED ORDER — CHLORHEXIDINE GLUCONATE CLOTH 2 % EX PADS
6.0000 | MEDICATED_PAD | Freq: Every day | CUTANEOUS | Status: DC
  Administered 2020-01-09 – 2020-01-14 (×6): 6 via TOPICAL

## 2020-01-09 MED ORDER — STERILE WATER FOR INJECTION IV SOLN
INTRAVENOUS | Status: DC
  Filled 2020-01-09 (×4): qty 850

## 2020-01-09 MED ORDER — HEPARIN (PORCINE) 25000 UT/250ML-% IV SOLN
1300.0000 [IU]/h | INTRAVENOUS | Status: DC
  Administered 2020-01-09: 1550 [IU]/h via INTRAVENOUS
  Administered 2020-01-10 – 2020-01-14 (×4): 1200 [IU]/h via INTRAVENOUS
  Filled 2020-01-09 (×6): qty 250

## 2020-01-09 NOTE — H&P (Addendum)
History and Physical    DIYARI CHERNE KVQ:[id number] DOB: [birth year] DOA: 01/09/2020  PCP: No primary care provider on file.  Patient coming from: [place]  I have personally briefly reviewed patient's old medical records in [place]  Chief Complaint: AKF  HPI: Caleb Lyons is a 86 y.o. male with medical history significant of obesity, OSA, SSS s/p PPM / AICD, chronic diastolic CHF, ICM.  Pt presented to [facility] ED on 2/16 with L sided chest discomfort.  Trop neg x3.  Was found to have AKF with creat of 8.8, BUN 89.  This significantly worse than his baseline creat of 1.2 just last month.  It was suspected that dehydration / pre-renal may be to blame as he was started on lasix 20mg  BID and when that didn't work for peripheral edema it was increased to 40mg  BID and he was on a very fluid restricted diet.  As a result he got 6L+ total fluid at [facility] over course of 2/17.  After 5L of that though, kidney function didn't show significant improvement, only had 900cc UOP, initally urine appearing but now changed to hematuria.  UA showed UTI, UCx growing 100k CFU of a GNR of some sort, started on rocephin.  Bicarb of 19, anion gap of 20.  WBC 13k.  BPs have been running on the low side, especially initially during stay when he came in with systolics in the 64P.  Now 115/58.  Looks like creat after the medicine consult went down to 7.7, BUN 91 though, so not sure that's much of an improvement.  Patient finally able to get bed and transferred over to [facility] now.    Review of Systems: As per HPI, otherwise all review of systems negative.  Past Medical History:  Diagnosis Date  . Complete heart block ([place])   . Dual implantable cardiac defibrillator in situ    [facility]  . History of chicken pox   . HLD (hyperlipidemia)   . HTN (hypertension)   . Hyperparathyroidism   . Impotence of organic origin   . Ischemic cardiomyopathy    s/pCABG, Graft patent 3/11; EF 30-35%  . Obesity       .  OSA (obstructive sleep apnea)   . PAF (paroxysmal atrial fibrillation) ([place])   . Sinus node dysfunction (HCC)   . Type II or unspecified type diabetes mellitus without mention of complication, not stated as uncontrolled     Past Surgical History:  Procedure Laterality Date  . CARDIOVERSION N/A 05/13/2019   Procedure: CARDIOVERSION;  Surgeon: Fay Records, MD;  Location: [facility] ENDOSCOPY;  Service: Cardiovascular;  Laterality: N/A;  . CORONARY ARTERY BYPASS GRAFT    . ICD GENERATOR CHANGEOUT N/A 01/18/2019   Procedure: ICD GENERATOR CHANGEOUT - Dual Chamber;  Surgeon: Deboraha Sprang, MD;  Location: [place] CV LAB;  Service: Cardiovascular;  Laterality: N/A;  . TONSILLECTOMY       reports that he has never smoked. He has never used smokeless tobacco. He reports current alcohol use. He reports that he does not use drugs.  No Known Allergies  Family History  Problem Relation Age of Onset  . Early death Father   . Stroke Father      Prior to Admission medications   Medication Sig Start Date End Date Taking? Authorizing Provider  allopurinol (ZYLOPRIM) 100 MG tablet Take 100 mg by mouth every evening.     [provider]  apixaban (ELIQUIS) 5 MG TABS tablet Take 1 tablet (  5 mg total) by mouth 2 (two) times daily. 10/30/18   Sherran Needs, NP  atorvastatin (LIPITOR) 40 MG tablet Take 40 mg by mouth every evening.     [provider]  cinacalcet (SENSIPAR) 30 MG tablet Take 1 tablet (30 mg total) by mouth daily with breakfast. 04/21/14   Renato Shin, MD  COLCRYS 0.6 MG tablet Take 0.6 mg by mouth daily.    [provider]  furosemide (LASIX) 20 MG tablet Take 20 mg by mouth 2 (two) times daily.    [provider]  GLUCOSAMINE-CHONDROITIN PO Take 1 tablet by mouth 2 (two) times a day.    [provider]  levothyroxine (SYNTHROID, LEVOTHROID) 200 MCG tablet Take 200 mcg by mouth daily before breakfast.    [provider]  Menthol,  Topical Analgesic, (BENGAY EX) Apply 1 application topically daily as needed (pain).    [provider]  metoprolol (TOPROL-XL) 50 MG 24 hr tablet Take 50 mg by mouth every evening.     [provider]  Misc Natural Products (PROSTATE SUPPORT PO) Take 600 mg by mouth 2 (two) times daily. Super Beta Prostate     [provider]  Multiple Vitamin (MULTIVITAMIN WITH MINERALS) TABS tablet Take 1 tablet by mouth daily.    [provider]  omeprazole (PRILOSEC) 20 MG capsule Take 20 mg by mouth daily.    [provider]  oxymetazoline (AFRIN) 0.05 % nasal spray Place 1 spray into both nostrils at bedtime.    [provider]  potassium chloride (K-DUR) 10 MEQ tablet Take 10 mEq by mouth 2 (two) times daily.    [provider]  valsartan-hydrochlorothiazide (DIOVAN-HCT) 160-12.5 MG tablet Take 1 tablet by mouth daily.    [provider]  vitamin C (ASCORBIC ACID) 500 MG tablet Take 500 mg by mouth every evening.     [provider]    Physical Exam: Vitals:   01/09/20 0007 01/09/20 0022  BP: (!) 94/52 (!) 115/58  Pulse: (!) 50 (!) 51  Resp: 20   Temp: 98.2 F (36.8 C)   SpO2: 99% 99%    Constitutional: NAD, calm, comfortable Eyes: PERRL, lids and conjunctivae normal ENMT: Mucous membranes are moist. Posterior pharynx clear of any exudate or lesions.Normal dentition.  Neck: normal, supple, no masses, no thyromegaly Respiratory: clear to auscultation bilaterally, no wheezing, no crackles. Normal respiratory effort. No accessory muscle use.  Cardiovascular: Regular rate and rhythm, no murmurs / rubs / gallops. Trace BLE edema, mostly non-pitting edema though (adipose tissue).. 2+ pedal pulses. No carotid bruits.  Abdomen: no tenderness, no masses palpated. No hepatosplenomegaly. Bowel sounds positive.  Musculoskeletal: no clubbing / cyanosis. No joint deformity upper and lower extremities. Good ROM, no contractures.  Normal muscle tone.  Skin: no rashes, lesions, ulcers. No induration Neurologic: CN 2-12 grossly intact. Sensation intact, DTR normal. Strength 5/5 in all 4.  Psychiatric: Normal judgment and insight. Alert and oriented x 3. Normal mood.    Labs on Admission: I have personally reviewed following labs and imaging studies  CBC: No results for input(s): WBC, NEUTROABS, HGB, HCT, MCV, PLT in the last 168 hours. Basic Metabolic Panel: No results for input(s): NA, K, CL, CO2, GLUCOSE, BUN, CREATININE, CALCIUM, MG, PHOS in the last 168 hours. GFR: CrCl cannot be calculated (Patient's most recent lab result is older than the maximum 21 days allowed.). Liver Function Tests: No results for input(s): AST, ALT, ALKPHOS, BILITOT, PROT, ALBUMIN in the last  168 hours. No results for input(s): LIPASE, AMYLASE in the last 168 hours. No results for input(s): AMMONIA in the last 168 hours. Coagulation Profile: No results for input(s): INR, PROTIME in the last 168 hours. Cardiac Enzymes: No results for input(s): CKTOTAL, CKMB, CKMBINDEX, TROPONINI in the last 168 hours. BNP (last 3 results) No results for input(s): PROBNP in the last 8760 hours. HbA1C: No results for input(s): HGBA1C in the last 72 hours. CBG: No results for input(s): GLUCAP in the last 168 hours. Lipid Profile: No results for input(s): CHOL, HDL, LDLCALC, TRIG, CHOLHDL, LDLDIRECT in the last 72 hours. Thyroid Function Tests: No results for input(s): TSH, T4TOTAL, FREET4, T3FREE, THYROIDAB in the last 72 hours. Anemia Panel: No results for input(s): VITAMINB12, FOLATE, FERRITIN, TIBC, IRON, RETICCTPCT in the last 72 hours. Urine analysis: No results found for: COLORURINE, APPEARANCEUR, LABSPEC, PHURINE, GLUCOSEU, HGBUR, BILIRUBINUR, KETONESUR, PROTEINUR, UROBILINOGEN, NITRITE, LEUKOCYTESUR  Radiological Exams on Admission: No results found.  EKG: Independently reviewed.  Assessment/Plan Principal Problem:   Acute kidney  failure (HCC) Active Problems:   Ischemic cardiomyopathy   HTN (hypertension)   Type 2 diabetes mellitus with circulatory disorder (HCC)   Acute lower UTI    1. AKF - 1. ATN due to low BPs / dehydration / lasix ? 2. Repeat CBC/CMP stat now 3. Nephrology consult now or in AM depending on results of labs. 4. IVF: got 6L+ at [facility].  Will leave on LR at 150 started at [facility] this afternoon for the moment. 1. Labs pending 5. Strict intake and output 6. CT renal study didn't show any evidence of obstruction 7. Foley is in place 8. Holding all BP meds for now given low BPs on admission and borderline BPs during RH stay (including lasix and ARB) 9. Hold PPI 2. UTI - 1. Rocephin 2. 100k CFU of GNR growing at [facility] 3. SSS / PPM - 1. Putting on tele monitor 2. BB on hold due to low BPs 3. Continue amiodarone when med-rec complete 4. Hypothyroidism - 1. Cont synthroid 5. HTN - 1. Holding all BP meds as noted above 6. Chronic diastolic CHF - 1. Not in acute exacerbation at this point 2. Holding diuretics as noted above 7. BPH - 1. Foley in place  2. Flomax on hold due to low BPs 3. Detrol also on hold 8. DM2 - 1. Chart diagnosis? 2. Looks like hes on no chronic meds for this 3. CBG only 104 in ED.  DVT prophylaxis: SCDs - hematuria Code Status: Full Family Communication: No family in room Disposition Plan: Home after admit Consults called: None- needs nephro consult in AM, sooner if labs indicate Admission status: Admit to inpatient  Severity of Illness: The appropriate patient status for this patient is INPATIENT. Inpatient status is judged to be reasonable and necessary in order to provide the required intensity of service to ensure the patient's safety. The patient's presenting symptoms, physical exam findings, and initial radiographic and laboratory data in the context of their chronic comorbidities is felt to place them at high risk for further clinical deterioration. Furthermore, it  is not anticipated that the patient will be medically stable for discharge from the hospital within 2 midnights of admission. The following factors support the patient status of inpatient.   IP status for AKF with creat 8.8 up from 1.2 baseline.   * I certify that at the point of admission it is my clinical judgment that the patient will require inpatient hospital care spanning beyond 2  midnights from the point of admission due to high intensity of service, high risk for further deterioration and high frequency of surveillance required.*    Ghassan Coggeshall M. DO [facility]  How to contact the [facility] Attending or Consulting provider [place] or covering provider during after hours [place], for this patient?  1. Check the care team in [facility] and look for a) attending/consulting [facility] provider listed and b) the [facility] team listed 2. Log into [email or web address]  Amion Physician Scheduling and messaging for groups and whole hospitals  On call and physician scheduling software for group practices, residents, hospitalists and other medical providers for call, clinic, rotation and shift schedules. OnCall Enterprise is a hospital-wide system for scheduling doctors and paging doctors on call. EasyPlot is for scientific plotting and data analysis.  [email or web address]  and use Tool's universal password to access. If you do not have the password, please contact the hospital operator.  3. Locate the [facility] provider you are looking for under [facility] and page to a number that you can be directly reached. 4. If you still have difficulty reaching the provider, please page the [facility] (Director on Call) for the Hospitalists listed on amion for assistance.  01/09/2020, 12:53 AM

## 2020-01-09 NOTE — Progress Notes (Addendum)
Bladder scan complete.  Flushed line with 68ml  and no signs of blood clots.

## 2020-01-09 NOTE — Consult Note (Addendum)
[facility]  INPATIENT CONSULTATION  Reason for Consultation: AKI Requesting Provider: Dr. Ree Kida  HPI: Caleb Lyons is an 86 y.o. male with h/o SSS s/p PPM, atrial fibrillation, HTN, HL, HFpEF (2017 TTE EF nl), BPH, diet controlled DM type 2, primary hyperPTH who is seen for evaluation and management of AKI.   Pt initially presented to [facility] on 2/16 with L sided CP.  Troponin neg x 3.  Labs showed AKI with BUN 89/Cr 8.8.  Per report Cr was 1.2 last month.  He had recently been started on lasix titrated to 40 BID + fluid restriction for LE edema.  Presenting SBP in the 80s and was on diovan 160,hydralazine 10 QID.   Prerenal azotemia was suspected and he was given 6L of IVF over 2/17.  UOP reported 928mL.  CT a/p without contrast showed no obstruction. UA c/w UTI (2+ LE, hazy, 4+ bacteria, TNTC WBC, 10-20 RBC/hpf) and culture 100k CFU of GNR and he was initiated on ceftiaxone.  Leukocytosis to 13k.  BPs improved to the 100s.  He was transferred to [facility] overnight and labs this AM showed BUN 91/Cr 7.88, K 3.4, Bicarb 17.  CK 838.  He is currently on bicarb gtt at 75/hr.  Very little UOP which is now frankly bloody after foley inserted.   I spoke with patient who noted that he's had > 1 month of watery, nonbloody diarrhea without abdominal pain.  He noted no dysgeusia but lack of taste that's led to little oral intake.  No pruritis.  Felt confused after admitted to hospital but fairly lucid currently.  He was not able to really provide a lot of details on time line.   I spoke to his wife on phone who confirmed the watery diarrhea.  His lasix dose was increased about 3 weeks ago due to edema.  She says the edema improved, but he has chronic dyspnea which was unchanged.  +confusion.  +little oral intake saying "I don't know how he's surviving for how little he eats"  In the 2-3 days prior to [facility] presentation he had essentially no UOP.    Uses walker at home, doesn't drive.  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 Complete heart block ([place])   . Dual implantable cardiac defibrillator in situ    [facility]  . History of chicken pox   . HLD (hyperlipidemia)   . HTN (hypertension)   . Hyperparathyroidism   . Impotence of organic origin   . Ischemic cardiomyopathy    s/pCABG, Graft patent 3/11; EF 30-35%  . Obesity       . OSA (obstructive sleep apnea)   . PAF (paroxysmal atrial fibrillation) ([place])   . Sinus node dysfunction (HCC)   . Type II or unspecified type diabetes mellitus without mention of complication, not stated as uncontrolled    PSH: Past Surgical History:  Procedure Laterality Date  . CARDIOVERSION N/A 05/13/2019   Procedure: CARDIOVERSION;  Surgeon: Fay Records, MD;  Location: [facility] ENDOSCOPY;  Service: Cardiovascular;  Laterality: N/A;  . CORONARY ARTERY BYPASS GRAFT    . ICD GENERATOR CHANGEOUT N/A 01/18/2019   Procedure: ICD GENERATOR CHANGEOUT - Dual Chamber;  Surgeon: Deboraha Sprang, MD;  Location: [place] CV LAB;  Service: Cardiovascular;  Laterality: N/A;  . TONSILLECTOMY      Past Medical History:  Diagnosis Date  . Complete heart block ([place])   .  Dual implantable cardiac defibrillator in situ    [facility]  . History of chicken pox   . HLD (hyperlipidemia)   . HTN (hypertension)   . Hyperparathyroidism   . Impotence of organic origin   . Ischemic cardiomyopathy    s/pCABG, Graft patent 3/11; EF 30-35%  . Obesity       . OSA (obstructive sleep apnea)   . PAF (paroxysmal atrial fibrillation) ([place])   . Sinus node dysfunction (HCC)   . Type II or unspecified type diabetes mellitus without mention of complication, not stated as uncontrolled     Medications:  I have reviewed the patient's current medications.  Medications Prior to Admission  Medication Sig Dispense Refill  . allopurinol (ZYLOPRIM) 100 MG tablet Take 100 mg by mouth daily.     Marland Kitchen amiodarone (PACERONE)  200 MG tablet Take 200 mg by mouth 3 (three) times daily.    Marland Kitchen apixaban (ELIQUIS) 5 MG TABS tablet Take 1 tablet (5 mg total) by mouth 2 (two) times daily. 180 tablet 2  . atorvastatin (LIPITOR) 40 MG tablet Take 40 mg by mouth every evening.     . cinacalcet (SENSIPAR) 30 MG tablet Take 1 tablet (30 mg total) by mouth daily with breakfast. (Patient taking differently: Take 30 mg by mouth at bedtime. ) 90 tablet 0  . COLCRYS 0.6 MG tablet Take 0.6 mg by mouth at bedtime.     . diphenoxylate-atropine (LOMOTIL) 2.5-0.025 MG tablet Take 2 tablets by mouth 4 (four) times daily as needed for diarrhea or loose stools.    . furosemide (LASIX) 20 MG tablet Take 20 mg by mouth daily.     Marland Kitchen GLUCOSAMINE-CHONDROITIN PO Take 2 tablets by mouth daily.     . hydrALAZINE (APRESOLINE) 10 MG tablet Take 10 mg by mouth 4 (four) times daily.    Marland Kitchen levothyroxine (SYNTHROID, LEVOTHROID) 200 MCG tablet Take 200 mcg by mouth daily before breakfast.    . Menthol, Topical Analgesic, (BENGAY EX) Apply 1 application topically daily as needed (pain).    . metoprolol (TOPROL-XL) 50 MG 24 hr tablet Take 50 mg by mouth daily.     . Misc Natural Products (PROSTATE SUPPORT PO) Take 600 mg by mouth 2 (two) times daily. Super Beta Prostate     . Multiple Vitamin (MULTIVITAMIN WITH MINERALS) TABS tablet Take 1 tablet by mouth daily.    Marland Kitchen omeprazole (PRILOSEC) 20 MG capsule Take 20 mg by mouth daily.    Marland Kitchen oxymetazoline (AFRIN) 0.05 % nasal spray Place 1 spray into both nostrils at bedtime.    . potassium chloride (K-DUR) 10 MEQ tablet Take 10 mEq by mouth 2 (two) times daily.    Marland Kitchen senna-docusate (SENOKOT-S) 8.6-50 MG tablet Take 1 tablet by mouth 2 (two) times daily as needed for mild constipation.    . tamsulosin (FLOMAX) 0.4 MG CAPS capsule Take 0.4 mg by mouth at bedtime.    . tolterodine (DETROL LA) 4 MG 24 hr capsule Take 4 mg by mouth daily.    . traMADol (ULTRAM) 50 MG tablet Take 50 mg by mouth every 6 (six) hours as needed  for moderate pain.    . valsartan (DIOVAN) 160 MG tablet Take 160 mg by mouth daily.    . vitamin C (ASCORBIC ACID) 500 MG tablet Take 500 mg by mouth every evening.       ALLERGIES:  No Known Allergies  FAM HX: Family History  Problem Relation Age of Onset  . Early  death Father   . Stroke Father     Social History:   reports that he has never smoked. He has never used smokeless tobacco. He reports current alcohol use. He reports that he does not use drugs.  ROS: 12 system ROS neg except per HPI above  Blood pressure (!) 105/54, pulse (!) 52, temperature 97.9 F (36.6 C), temperature source Oral, resp. rate 18, height 6\' 1"  (1.854 m), weight (!) 203.2 kg, SpO2 100 %. PHYSICAL EXAM: Gen: elderly obese man who is not in distress  Eyes: anicteric ENT: dry MM Neck: supple CV:  RRR Abd:  Soft, obese, NABS Lungs: normal WOB at rest, clear ant and laterally GU: foley with bloody urine outpt, no frank cltos Extr: no edema Neuro: AOx3 but seems not to recall details or time well Skin: warm and dry, no obvious rashes   Results for orders placed or performed during the hospital encounter of 01/09/20 (from the past 48 hour(s))  CBC     Status: Abnormal   Collection Time: 01/09/20  2:07 AM  Result Value Ref Range   WBC 11.8 (H) 4.0 - 10.5 K/uL   RBC 4.08 (L) 4.22 - 5.81 MIL/uL   Hemoglobin 13.2 13.0 - 17.0 g/dL   HCT 40.2 39.0 - 52.0 %   MCV 98.5 80.0 - 100.0 fL   MCH 32.4 26.0 - 34.0 pg   MCHC 32.8 30.0 - 36.0 g/dL   RDW 17.3 (H) 11.5 - 15.5 %   Platelets 195 150 - 400 K/uL   nRBC 0.0 0.0 - 0.2 %    Comment: Performed at [facility], [address], [place], [place] [address]  Comprehensive metabolic panel     Status: Abnormal   Collection Time: 01/09/20  2:07 AM  Result Value Ref Range   Sodium 139 135 - 145 mmol/L   Potassium 3.4 (L) 3.5 - 5.1 mmol/L   Chloride 105 98 - 111 mmol/L   CO2 17 (L) 22 - 32 mmol/L   Glucose, Bld 106 (H) 70 - 99 mg/dL   BUN 91 (H) 8 -  23 mg/dL   Creatinine, Ser 7.88 (H) 0.61 - 1.24 mg/dL   Calcium 8.5 (L) 8.9 - 10.3 mg/dL   Total Protein 5.9 (L) 6.5 - 8.1 g/dL   Albumin 2.9 (L) 3.5 - 5.0 g/dL   AST 42 (H) 15 - 41 U/L   ALT 30 0 - 44 U/L   Alkaline Phosphatase 76 38 - 126 U/L   Total Bilirubin 0.7 0.3 - 1.2 mg/dL   GFR calc non Af Amer 6 (L) >60 mL/min   GFR calc Af Amer 6 (L) >60 mL/min   Anion gap 17 (H) 5 - 15    Comment: Performed at [facility], [place] [address], [place], [place] [address]  Lactic acid, plasma     Status: None   Collection Time: 01/09/20  2:07 AM  Result Value Ref Range   Lactic Acid, Venous 1.6 0.5 - 1.9 mmol/L    Comment: Performed at [place] [address], [place], [place] [address]  CK     Status: Abnormal   Collection Time: 01/09/20  2:07 AM  Result Value Ref Range   Total CK 838 (H) 49 - 397 U/L    Comment: Performed at [facility], [place] [address], [place], [place] [address]  SARS CORONAVIRUS 2 (TAT 6-24 HRS) Nasopharyngeal Nasopharyngeal Swab     Status: None  Collection Time: 01/09/20  3:19 AM   Specimen: Nasopharyngeal Swab  Result Value Ref Range   SARS Coronavirus 2 NEGATIVE NEGATIVE    Comment: (NOTE) SARS-CoV-2 target nucleic acids are NOT DETECTED. The SARS-CoV-2 RNA is generally detectable in upper and lower respiratory specimens during the acute phase of infection. Negative results do not preclude SARS-CoV-2 infection, do not rule out co-infections with other pathogens, and should not be used as the sole basis for treatment or other patient management decisions. Negative results must be combined with clinical observations, patient history, and epidemiological information. The expected result is Negative. Fact Sheet for Patients: SugarRoll.be Fact Sheet for Healthcare Providers: https://www.woods-mathews.com/ This test is not yet approved or cleared by the [place] FDA and  has been authorized for  detection and/or diagnosis of SARS-CoV-2 by FDA under an Emergency Use Authorization (EUA). This EUA will remain  in effect (meaning this test can be used) for the duration of the COVID-19 declaration under Section 56 4(b)(1) of the Act, 21 U.S.C. section 360bbb-3(b)(1), unless the authorization is terminated or revoked sooner. Performed at [facility], [place] [address], [place], [place] [address]   Lactic acid, plasma     Status: None   Collection Time: 01/09/20  5:51 AM  Result Value Ref Range   Lactic Acid, Venous 1.4 0.5 - 1.9 mmol/L    Comment: Performed at [place] [address] [place], [place] [address]  ABO/Rh     Status: None   Collection Time: 01/09/20  5:51 AM  Result Value Ref Range   ABO/RH(D)      A POS Performed at [facility] [address] [place], [place] [address]     No results found.  Assessment/Plan **AKI, severe, oliguric:  Suspect this is multifactorial with hypovolemia (diarrhea, diuretics), hypotension > ATN.  The lack of improvement and oliguric state today make me think it's more ATN though he still looks a bit dry.   Agree with holding ARB and diuretics for now, maintaining IVF overnight (currently bicarb at 75/hr).  PPI also on hold, though doubt AIN as etiology for AKI.  He is a poor candidate for dialysis with his limited mobility and advanced age --> his wife thinks he would decline if needed but will d/w him.  I discussed conservative care which would involve hospice most likely should he not improve.    **elevated CK:  Not elevated to a level that would cause severe AKI but not helping; Held statin for now.  AST also mildly elevated.   **Primary hyperPTH: on sensipar, corrected ca is normal.  Will check phos.   **BPH: no sign of obstruction on imaging .   **UTI: GNRs on RH culture, on CTX here.   **Hematuria:  Pilar Plate hematuria note after foley placed; suspect trauma.  Will check bladder scan to make sure bladder not  full with malfunctioning foley though I didn't see any clots in the tubing.   **HFpEF: currently hypovolemic.  Has been admitted in past for volume overload.  Judicious use of fluids.    **Atrial fibrillation: on heparin gtt currently -- if worsening hematuria would hold.  **Diarrhea: per pt and wife x > 1 mo. W/u pending occurrence in hospital.    Justin Mend 01/09/2020, 1:34 PM  ADDENDUM: Rec'd call from RN -- bladder scanner with 673mL x 3, flushed foley --> saline 69mL in easily but not able to be aspirated back.   Asked for foley to  be exchanged this evening.

## 2020-01-09 NOTE — Progress Notes (Signed)
ANTICOAGULATION CONSULT NOTE  Pharmacy Consult for heparin Indication: atrial fibrillation  No Known Allergies  Patient Measurements: Height: 6\' 1"  (185.4 cm) Weight: (402lbs) IBW/kg (Calculated) : 79.9 Heparin Dosing Weight: 130 kg  Vital Signs: Temp: 97.8 F (36.6 C) (02/18 2035) Temp Source: Oral (02/18 2035) BP: 104/57 (02/18 2035) Pulse Rate: 63 (02/18 2035)  Labs: Recent Labs    01/09/20 0207 01/09/20 2022  HGB 13.2  --   HCT 40.2  --   PLT 195  --   APTT  --  118*  CREATININE 7.88*  --   CKTOTAL 838*  --     Estimated Creatinine Clearance: 12.1 mL/min (A) (by C-G formula based on SCr of 7.88 mg/dL (H)).  Assessment: CC/HPI: 86 yo m presenting with aki  PMH: OSA SSS PPM AICD, CHF ICM, afib on apixaban  Anticoag: apixaban pta for a fib - last dose 2/15>> IV hep  Renal: SCr 7.88   Heme/Onc: H&H 13.2/40.2, Plt 195  Aptt tonight is above goal at 118s. No bleeding issues noted.   Goal of Therapy:  Heparin level 0.3-0.7 units/ml aPTT 66 - 102 seconds Monitor platelets by anticoagulation protocol: Yes   Plan:  Reduce heparin gtt to 1350 units/hr Daily HL aPTT cbc F/u plans to return to apixaban   Erin Hearing PharmD., BCPS Clinical Pharmacist 01/09/2020 10:24 PM

## 2020-01-09 NOTE — Plan of Care (Signed)
  Problem: Education: Goal: Knowledge of General Education information will improve Description: Including pain rating scale, medication(s)/side effects and non-pharmacologic comfort measures Outcome: Progressing   Problem: Clinical Measurements: Goal: Ability to maintain clinical measurements within normal limits will improve Outcome: Progressing   

## 2020-01-09 NOTE — Progress Notes (Signed)
Labs reviewed: bicarb starting to trend down slightly, will switch IVF to 75cc/hr LR and 75cc/hr bicarb for the moment.  Needs Nephro consult in AM as kidneys do not appear to be improving despite our best efforts.

## 2020-01-09 NOTE — Progress Notes (Signed)
Called pharmacy to make sure sodium Bicarbonate and Heparin were compatible to run into same IV site.

## 2020-01-09 NOTE — Progress Notes (Signed)
Patient not placed on CPAP at this time due to no recent COVID test.

## 2020-01-09 NOTE — Progress Notes (Addendum)
ANTICOAGULATION CONSULT NOTE - Initial Consult  Pharmacy Consult for heparin Indication: atrial fibrillation  No Known Allergies  Patient Measurements: Height: 6\' 1"  (185.4 cm) Weight: (!) 448 lb (203.2 kg) IBW/kg (Calculated) : 79.9 Heparin Dosing Weight: 130 kg  Vital Signs: Temp: 97.9 F (36.6 C) (02/18 0929) Temp Source: Oral (02/18 0929) BP: 105/54 (02/18 0929) Pulse Rate: 52 (02/18 0929)  Labs: Recent Labs    01/09/20 0207  HGB 13.2  HCT 40.2  PLT 195  CREATININE 7.88*  CKTOTAL 838*    Estimated Creatinine Clearance: 12.1 mL/min (A) (by C-G formula based on SCr of 7.88 mg/dL (H)).  Assessment: CC/HPI: 86 yo m presenting with aki  PMH: OSA SSS PPM AICD, CHF ICM, afib on apixaban  Anticoag: apixaban pta for a fib - last dose 2/15>> IV hep  Renal: SCr 7.88   Heme/Onc: H&H 13.2/40.2, Plt 195  Goal of Therapy:  Heparin level 0.3-0.7 units/ml aPTT 66 - 102 seconds Monitor platelets by anticoagulation protocol: Yes   Plan:  Heparin gtt 1550 units/hr Initial aptt 2000 Daily HL aPTT cbc F/u plans to return to apixaban   Barth Kirks, PharmD, BCPS, BCCCP Clinical Pharmacist [phone]  Please check AMION for all [place] numbers  01/09/2020 12:16 PM

## 2020-01-09 NOTE — Progress Notes (Addendum)
PROGRESS NOTE    Caleb Lyons  VQQ:[id number] DOB: [birth year] DOA: 01/09/2020 PCP: No primary care provider on file.   Brief Narrative:  HPI On 01/09/2020 by Dr. Jennette Kettle Caleb Lyons is a 86 y.o. male with medical history significant of obesity, OSA, SSS s/p PPM / AICD, chronic diastolic CHF, ICM. Pt presented to [facility] ED on 2/16 with L sided chest discomfort.  Trop neg x3. Was found to have AKF with creat of 8.8, BUN 89.  This significantly worse than his baseline creat of 1.2 just last month. It was suspected that dehydration / pre-renal may be to blame as he was started on lasix 20mg  BID and when that didn't work for peripheral edema it was increased to 40mg  BID and he was on a very fluid restricted diet. As a result he got 6L+ total fluid at [facility] over course of 2/17. After 5L of that though, kidney function didn't show significant improvement, only had 900cc UOP, initally urine appearing but now changed to hematuria. UA showed UTI, UCx growing 100k CFU of a GNR of some sort, started on rocephin. Bicarb of 19, anion gap of 20. WBC 13k. BPs have been running on the low side, especially initially during stay when he came in with systolics in the 43P.  Now 115/58. Looks like creat after the medicine consult went down to 7.7, BUN 91 though, so not sure that's much of an improvement. Patient finally able to get bed and transferred over to [facility] now.  Interim history Admitted with AKI. Nephrology consulted.   Assessment & Plan  Patient admitted earlier today by Dr. Jennette Kettle. See H&P for details.  Acute kidney injury -Creatinine on admission 7.88 (baseline 1.2) -Unknown etiology, possibly dehydration, ATN with low blood pressures, Lasix use -Patient received approximately 6 L of IV fluid at an outside facility -Continue IV fluid -Monitor intake and output-patient does have Foley catheter in place -Nephrology consulted and appreciated -CT renal study did not show any evidence of  obstruction  UTI -Patient had 100k GNR at outside facility -Continue ceftriaxone  Sick sinus syndrome/Paroxsymal Atrial fibrillation -Patient with pacemaker placement -Hold Eliquis, will place on heparin -patient does not appear to be on amiodarone -hold metoprolol -patient follows with EP  Hypothyroidism -Continue Synthroid  Chronic diastolic heart failure -appears to be stable and euvolemic  -Continue to monitor I/O, daily weights -diuretics held due to AKI  BPH -Foley in place -Flomax and detrol held  Diabetes mellitus, type II -possibly diet controlled, on no medications at home  DVT Prophylaxis  heparin  Code Status: DNR  Family Communication: None at bedside  Disposition Plan: Admitted from outside hospital (likely from home) for AKI. Nephro consulted and pending. Dispo TBD  Consultants Nephrology   Procedures  None  Antibiotics   Anti-infectives (From admission, onward)   Start     Dose/Rate Route Frequency Ordered Stop   01/09/20 0100  cefTRIAXone (ROCEPHIN) 1 g in sodium chloride 0.9 % 100 mL IVPB     1 g 200 mL/hr over 30 Minutes Intravenous Every 24 hours 01/09/20 0047        Subjective:   Caleb Lyons seen and examined today.  No complaints this morning. Asking about his kidneys and the next step.  Objective:   Vitals:   01/09/20 0022 01/09/20 0546 01/09/20 0546 01/09/20 0929  BP: (!) 115/58 (!) 108/54 (!) 108/54 (!) 105/54  Pulse: (!) 51 (!) 51 (!) 51 (!) 52  Resp:  16 16  18  Temp:  98.4 F (36.9 C) 97.8 F (36.6 C) 97.9 F (36.6 C)  TempSrc:  Oral Oral Oral  SpO2: 99% 95% 95% 100%    Intake/Output Summary (Last 24 hours) at 01/09/2020 1056 Last data filed at 01/09/2020 0615 Gross per 24 hour  Intake 873.09 ml  Output --  Net 873.09 ml   There were no vitals filed for this visit.  Exam  General: Well developed, well nourished, NAD, appears stated age  HEENT: NCAT,  mucous membranes moist.   Cardiovascular: S1 S2  auscultated  Respiratory: Clear to auscultation bilaterally  Abdomen: Soft, obese, nontender, nondistended, + bowel sounds  Extremities: warm dry without cyanosis clubbing. Trace B/L LE edema  Neuro: AAOx3,nonfocal  Psych: Normal affect and demeanor with intact judgement and insight   Data Reviewed: I have personally reviewed following labs and imaging studies  CBC: Recent Labs  Lab 01/09/20 0207  WBC 11.8*  HGB 13.2  HCT 40.2  MCV 98.5  PLT 992   Basic Metabolic Panel: Recent Labs  Lab 01/09/20 0207  NA 139  K 3.4*  CL 105  CO2 17*  GLUCOSE 106*  BUN 91*  CREATININE 7.88*  CALCIUM 8.5*   GFR: CrCl cannot be calculated (Unknown ideal weight.). Liver Function Tests: Recent Labs  Lab 01/09/20 0207  AST 42*  ALT 30  ALKPHOS 76  BILITOT 0.7  PROT 5.9*  ALBUMIN 2.9*   No results for input(s): LIPASE, AMYLASE in the last 168 hours. No results for input(s): AMMONIA in the last 168 hours. Coagulation Profile: No results for input(s): INR, PROTIME in the last 168 hours. Cardiac Enzymes: Recent Labs  Lab 01/09/20 0207  CKTOTAL 838*   BNP (last 3 results) No results for input(s): PROBNP in the last 8760 hours. HbA1C: No results for input(s): HGBA1C in the last 72 hours. CBG: No results for input(s): GLUCAP in the last 168 hours. Lipid Profile: No results for input(s): CHOL, HDL, LDLCALC, TRIG, CHOLHDL, LDLDIRECT in the last 72 hours. Thyroid Function Tests: No results for input(s): TSH, T4TOTAL, FREET4, T3FREE, THYROIDAB in the last 72 hours. Anemia Panel: No results for input(s): VITAMINB12, FOLATE, FERRITIN, TIBC, IRON, RETICCTPCT in the last 72 hours. Urine analysis: No results found for: COLORURINE, APPEARANCEUR, LABSPEC, PHURINE, GLUCOSEU, HGBUR, BILIRUBINUR, KETONESUR, PROTEINUR, UROBILINOGEN, NITRITE, LEUKOCYTESUR Sepsis Labs: @LABRCNTIP (procalcitonin:4,lacticidven:4)  ) Recent Results (from the past 240 hour(s))  SARS CORONAVIRUS 2 (TAT  6-24 HRS) Nasopharyngeal Nasopharyngeal Swab     Status: None   Collection Time: 01/09/20  3:19 AM   Specimen: Nasopharyngeal Swab  Result Value Ref Range Status   SARS Coronavirus 2 NEGATIVE NEGATIVE Final    Comment: (NOTE) SARS-CoV-2 target nucleic acids are NOT DETECTED. The SARS-CoV-2 RNA is generally detectable in upper and lower respiratory specimens during the acute phase of infection. Negative results do not preclude SARS-CoV-2 infection, do not rule out co-infections with other pathogens, and should not be used as the sole basis for treatment or other patient management decisions. Negative results must be combined with clinical observations, patient history, and epidemiological information. The expected result is Negative. Fact Sheet for Patients: SugarRoll.be Fact Sheet for Healthcare Providers: https://www.woods-mathews.com/ This test is not yet approved or cleared by the [place] FDA and  has been authorized for detection and/or diagnosis of SARS-CoV-2 by FDA under an Emergency Use Authorization (EUA). This EUA will remain  in effect (meaning this test can be used) for the duration of the COVID-19 declaration under Section 56 4(b)(1)  of the Act, 21 U.S.C. section 360bbb-3(b)(1), unless the authorization is terminated or revoked sooner. Performed at [facility], [place] [address], [place], [place] [address]       Radiology Studies: No results found.   Scheduled Meds: . furosemide  20 mg Oral BID   Continuous Infusions: . cefTRIAXone (ROCEPHIN)  IV 1 g (01/09/20 0148)  . lactated ringers 75 mL/hr at 01/09/20 0551  .  sodium bicarbonate (isotonic) infusion in sterile water 75 mL/hr at 01/09/20 0615     LOS: 0 days   Time Spent in minutes   45 minutes  Caleb Lyons D.O. on 01/09/2020 at 10:56 AM  Between 7am to 7pm - Please see pager noted on [email or web address]  After 7pm go to [email or web address]  And look for the  night coverage person covering for me after hours  [facility] Office  [phone]

## 2020-01-09 NOTE — Progress Notes (Addendum)
New Admission Note: Arrived from [facility]: via carelink on stretcher Mental Orientation: A & O X4 Telemetry: Box 15, ventricular paced, CCMD notified Assessment: Completed Skin: prior sacral pressure injury noted  IV: LFA, saline locked Pain: 0/10 Tubes: None Safety Measures: Safety Fall Prevention Plan has been discussed  Admission: to be completed 5 [place] Orientation: Patient has been orientated to the room, unit and staff.   Family: none at bedside  Orders to be reviewed and implemented. Will continue to monitor the patient. Call light has been placed within reach and bed alarm has been activated.

## 2020-01-09 NOTE — Plan of Care (Signed)
°  Problem: Coping: °Goal: Level of anxiety will decrease °Outcome: Progressing °  °

## 2020-01-10 DIAGNOSIS — I255 Ischemic cardiomyopathy: Secondary | ICD-10-CM

## 2020-01-10 DIAGNOSIS — N170 Acute kidney failure with tubular necrosis: Principal | ICD-10-CM

## 2020-01-10 DIAGNOSIS — I10 Essential (primary) hypertension: Secondary | ICD-10-CM

## 2020-01-10 DIAGNOSIS — I442 Atrioventricular block, complete: Secondary | ICD-10-CM

## 2020-01-10 DIAGNOSIS — E1159 Type 2 diabetes mellitus with other circulatory complications: Secondary | ICD-10-CM

## 2020-01-10 DIAGNOSIS — N390 Urinary tract infection, site not specified: Secondary | ICD-10-CM

## 2020-01-10 DIAGNOSIS — I484 Atypical atrial flutter: Secondary | ICD-10-CM

## 2020-01-10 DIAGNOSIS — I4819 Other persistent atrial fibrillation: Secondary | ICD-10-CM

## 2020-01-10 LAB — CBC
HCT: 38.2 % — ABNORMAL LOW (ref 39.0–52.0)
Hemoglobin: 12.7 g/dL — ABNORMAL LOW (ref 13.0–17.0)
MCH: 32.3 pg (ref 26.0–34.0)
MCHC: 33.2 g/dL (ref 30.0–36.0)
MCV: 97.2 fL (ref 80.0–100.0)
Platelets: 188 10*3/uL (ref 150–400)
RBC: 3.93 MIL/uL — ABNORMAL LOW (ref 4.22–5.81)
RDW: 17.2 % — ABNORMAL HIGH (ref 11.5–15.5)
WBC: 8.7 10*3/uL (ref 4.0–10.5)
nRBC: 0 % (ref 0.0–0.2)

## 2020-01-10 LAB — RENAL FUNCTION PANEL
Albumin: 2.5 g/dL — ABNORMAL LOW (ref 3.5–5.0)
Anion gap: 15 (ref 5–15)
BUN: 85 mg/dL — ABNORMAL HIGH (ref 8–23)
CO2: 21 mmol/L — ABNORMAL LOW (ref 22–32)
Calcium: 8.2 mg/dL — ABNORMAL LOW (ref 8.9–10.3)
Chloride: 104 mmol/L (ref 98–111)
Creatinine, Ser: 5.81 mg/dL — ABNORMAL HIGH (ref 0.61–1.24)
GFR calc Af Amer: 9 mL/min — ABNORMAL LOW (ref >60)
GFR calc non Af Amer: 8 mL/min — ABNORMAL LOW (ref >60)
Glucose, Bld: 98 mg/dL (ref 70–99)
Phosphorus: 4.3 mg/dL (ref 2.5–4.6)
Potassium: 3.4 mmol/L — ABNORMAL LOW (ref 3.5–5.1)
Sodium: 140 mmol/L (ref 135–145)

## 2020-01-10 LAB — CK: Total CK: 555 U/L — ABNORMAL HIGH (ref 49–397)

## 2020-01-10 LAB — HEPATIC FUNCTION PANEL
ALT: 25 U/L (ref 0–44)
AST: 41 U/L (ref 15–41)
Albumin: 2.4 g/dL — ABNORMAL LOW (ref 3.5–5.0)
Alkaline Phosphatase: 73 U/L (ref 38–126)
Bilirubin, Direct: 0.2 mg/dL (ref 0.0–0.2)
Indirect Bilirubin: 0.5 mg/dL (ref 0.3–0.9)
Total Bilirubin: 0.7 mg/dL (ref 0.3–1.2)
Total Protein: 5.5 g/dL — ABNORMAL LOW (ref 6.5–8.1)

## 2020-01-10 LAB — APTT
aPTT: 127 seconds — ABNORMAL HIGH (ref 24–36)
aPTT: 75 seconds — ABNORMAL HIGH (ref 24–36)

## 2020-01-10 LAB — HEPARIN LEVEL (UNFRACTIONATED): Heparin Unfractionated: >2.2 IU/mL — ABNORMAL HIGH (ref 0.30–0.70)

## 2020-01-10 MED ORDER — POTASSIUM CHLORIDE CRYS ER 20 MEQ PO TBCR
20.0000 meq | EXTENDED_RELEASE_TABLET | Freq: Once | ORAL | Status: AC
  Administered 2020-01-10: 20 meq via ORAL
  Filled 2020-01-10: qty 1

## 2020-01-10 NOTE — Progress Notes (Addendum)
PROGRESS NOTE    Caleb Lyons  PPJ:[id number] DOB: 06/14/1930 DOA: 01/09/2020 PCP: No primary care provider on file.   Brief Narrative:  HPI On 01/09/2020 by Dr. Jennette Kettle Caleb Lyons is a 86 y.o. male with medical history significant of obesity, OSA, SSS s/p PPM / AICD, chronic diastolic CHF, ICM. Pt presented to [facility] ED on 2/16 with L sided chest discomfort.  Trop neg x3. Was found to have AKF with creat of 8.8, BUN 89.  This significantly worse than his baseline creat of 1.2 just last month. It was suspected that dehydration / pre-renal may be to blame as he was started on lasix 20mg  BID and when that didn't work for peripheral edema it was increased to 40mg  BID and he was on a very fluid restricted diet. As a result he got 6L+ total fluid at [facility] over course of 2/17. After 5L of that though, kidney function didn't show significant improvement, only had 900cc UOP, initally urine appearing but now changed to hematuria. UA showed UTI, UCx growing 100k CFU of a GNR of some sort, started on rocephin. Bicarb of 19, anion gap of 20. WBC 13k. BPs have been running on the low side, especially initially during stay when he came in with systolics in the 58K.  Now 115/58. Looks like creat after the medicine consult went down to 7.7, BUN 91 though, so not sure that's much of an improvement. Patient finally able to get bed and transferred over to [facility] now.  Interim history Admitted with AKI. Nephrology consulted.   Assessment & Plan   Acute kidney injury -Creatinine on admission 7.88 (baseline 1.2) -Unknown etiology, possibly dehydration, ATN with low blood pressures, Lasix use, urinary retention -Patient received approximately 6 L of IV fluid at an outside facility -Continue IV fluid -Monitor intake and output-patient does have Foley catheter in place- noted have some hematuria in foley on 2/18 (?traumatic placement), no output. Foley was replaced, good UOP, clear urine at this  time. -Nephrology consulted and appreciated -CT renal study did not show any evidence of obstruction -Creatinine down to 5.81  UTI -Patient had 100k GNR at outside facility -[facility], grew Ecoli -Continue ceftriaxone  Mild rhabdomyolysis -CK 838 on admission, down to 555 today -doubt this is contributing to AKI -statin held -Continue IVF  Sick sinus syndrome/Paroxsymal Atrial fibrillation -Patient with pacemaker placement -Hold Eliquis, will place on heparin -hold metoprolol, amiodarone (per [facility] patient is on amiodarone 200mg  TID) -of note, patient was seen by Dr. Curt Bears 08/05/19, amiodarone was not mentioned -Called Dr. Bettina Gavia who has not seen this patient but was able to tell me that he was started on amiodarone 200mg  TID on 10/09/20.  -supposedly [employer] device was interrogated on 01/08/20, which showed persistent AF -Will consult cardiology    Hypothyroidism -Continue Synthroid  Chronic diastolic heart failure -appears to be stable and euvolemic  -Continue to monitor I/O, daily weights -diuretics held due to AKI  BPH -Foley in place -Flomax and detrol held  Diabetes mellitus, type II -possibly diet controlled, on no medications at home  Hypokalemia -mild, 3.4 today, will give small dose -monitor BMP  DVT Prophylaxis  heparin  Code Status: DNR  Family Communication: None at bedside  Disposition Plan: Admitted from outside hospital (likely from home) for AKI. Nephro following. Dispo TBD  Consultants Nephrology   Procedures  None  Antibiotics   Anti-infectives (From admission, onward)   Start     Dose/Rate Route Frequency Ordered Stop  01/09/20 0100  cefTRIAXone (ROCEPHIN) 1 g in sodium chloride 0.9 % 100 mL IVPB     1 g 200 mL/hr over 30 Minutes Intravenous Every 24 hours 01/09/20 0047        Subjective:   Caleb Lyons seen and examined today.  No complaints this morning. Denies chest pain, shortness of breath, abdominal pain,  N/V/D/C.   Objective:   Vitals:   01/09/20 1600 01/09/20 2035 01/10/20 0517 01/10/20 0547  BP: 115/60 (!) 104/57 115/60   Pulse: 62 63 70   Resp: 19 18 20    Temp: 97.6 F (36.4 C) 97.8 F (36.6 C) 98.1 F (36.7 C)   TempSrc: Oral Oral Oral   SpO2: 100% 99% 97%   Weight:    (!) 181.2 kg  Height:        Intake/Output Summary (Last 24 hours) at 01/10/2020 0857 Last data filed at 01/10/2020 0600 Gross per 24 hour  Intake 4097.07 ml  Output 2500 ml  Net 1597.07 ml   Filed Weights   01/09/20 0700 01/10/20 0547  Weight: (!) 203.2 kg (!) 181.2 kg   Exam  General: Well developed, well nourished, NAD, appears stated age  54: NCAT,  mucous membranes moist.   Cardiovascular: S1 S2 auscultated  Respiratory: Clear to auscultation bilaterally with equal chest rise  Abdomen: Soft, nontender, nondistended, + bowel sounds  Extremities: warm dry without cyanosis clubbing. Trace LE edema B/L  Neuro: AAOx3, nonfocal  Psych: Appropriate mood and affect, pleasant   Data Reviewed: I have personally reviewed following labs and imaging studies  CBC: Recent Labs  Lab 01/09/20 0207 01/10/20 0607  WBC 11.8* 8.7  HGB 13.2 12.7*  HCT 40.2 38.2*  MCV 98.5 97.2  PLT 195 476   Basic Metabolic Panel: Recent Labs  Lab 01/09/20 0207 01/10/20 0607  NA 139 140  K 3.4* 3.4*  CL 105 104  CO2 17* 21*  GLUCOSE 106* 98  BUN 91* 85*  CREATININE 7.88* 5.81*  CALCIUM 8.5* 8.2*  PHOS  --  4.3   GFR: Estimated Creatinine Clearance: 15.3 mL/min (A) (by C-G formula based on SCr of 5.81 mg/dL (H)). Liver Function Tests: Recent Labs  Lab 01/09/20 0207 01/10/20 0607 01/10/20 0618  AST 42*  --  41  ALT 30  --  25  ALKPHOS 76  --  73  BILITOT 0.7  --  0.7  PROT 5.9*  --  5.5*  ALBUMIN 2.9* 2.5* 2.4*   No results for input(s): LIPASE, AMYLASE in the last 168 hours. No results for input(s): AMMONIA in the last 168 hours. Coagulation Profile: No results for input(s): INR, PROTIME  in the last 168 hours. Cardiac Enzymes: Recent Labs  Lab 01/09/20 0207 01/10/20 0618  CKTOTAL 838* 555*   BNP (last 3 results) No results for input(s): PROBNP in the last 8760 hours. HbA1C: No results for input(s): HGBA1C in the last 72 hours. CBG: No results for input(s): GLUCAP in the last 168 hours. Lipid Profile: No results for input(s): CHOL, HDL, LDLCALC, TRIG, CHOLHDL, LDLDIRECT in the last 72 hours. Thyroid Function Tests: No results for input(s): TSH, T4TOTAL, FREET4, T3FREE, THYROIDAB in the last 72 hours. Anemia Panel: No results for input(s): VITAMINB12, FOLATE, FERRITIN, TIBC, IRON, RETICCTPCT in the last 72 hours. Urine analysis:    Component Value Date/Time   COLORURINE YELLOW 01/09/2020 1501   APPEARANCEUR CLOUDY (A) 01/09/2020 1501   LABSPEC 1.017 01/09/2020 1501   PHURINE 9.0 (H) 01/09/2020 1501   GLUCOSEU 150 (  A) 01/09/2020 1501   HGBUR MODERATE (A) 01/09/2020 1501   BILIRUBINUR NEGATIVE 01/09/2020 1501   KETONESUR 5 (A) 01/09/2020 1501   PROTEINUR >=300 (A) 01/09/2020 1501   NITRITE NEGATIVE 01/09/2020 1501   LEUKOCYTESUR SMALL (A) 01/09/2020 1501   Sepsis Labs: @LABRCNTIP (procalcitonin:4,lacticidven:4)  ) Recent Results (from the past 240 hour(s))  Culture, blood (routine x 2)     Status: None (Preliminary result)   Collection Time: 01/09/20  2:07 AM   Specimen: BLOOD  Result Value Ref Range Status   Specimen Description BLOOD RIGHT ANTECUBITAL  Final   Special Requests   Final    BOTTLES DRAWN AEROBIC AND ANAEROBIC Blood Culture adequate volume   Culture   Final    NO GROWTH 1 DAY Performed at [facility], [place] [address] [place], [place] [address]    Report Status PENDING  Incomplete  Culture, blood (routine x 2)     Status: None (Preliminary result)   Collection Time: 01/09/20  2:21 AM   Specimen: BLOOD LEFT HAND  Result Value Ref Range Status   Specimen Description BLOOD LEFT HAND  Final   Special Requests AEROBIC BOTTLE ONLY  Blood Culture adequate volume  Final   Culture   Final    NO GROWTH 1 DAY Performed at [facility], [place] [address] [place], [place] [address]    Report Status PENDING  Incomplete  SARS CORONAVIRUS 2 (TAT 6-24 HRS) Nasopharyngeal Nasopharyngeal Swab     Status: None   Collection Time: 01/09/20  3:19 AM   Specimen: Nasopharyngeal Swab  Result Value Ref Range Status   SARS Coronavirus 2 NEGATIVE NEGATIVE Final    Comment: (NOTE) SARS-CoV-2 target nucleic acids are NOT DETECTED. The SARS-CoV-2 RNA is generally detectable in upper and lower respiratory specimens during the acute phase of infection. Negative results do not preclude SARS-CoV-2 infection, do not rule out co-infections with other pathogens, and should not be used as the sole basis for treatment or other patient management decisions. Negative results must be combined with clinical observations, patient history, and epidemiological information. The expected result is Negative. Fact Sheet for Patients: SugarRoll.be Fact Sheet for Healthcare Providers: https://www.woods-mathews.com/ This test is not yet approved or cleared by the [place] FDA and  has been authorized for detection and/or diagnosis of SARS-CoV-2 by FDA under an Emergency Use Authorization (EUA). This EUA will remain  in effect (meaning this test can be used) for the duration of the COVID-19 declaration under Section 56 4(b)(1) of the Act, 21 U.S.C. section 360bbb-3(b)(1), unless the authorization is terminated or revoked sooner. Performed at [facility], [place] [address] [place], [place] [address]       Radiology Studies: No results found.   Scheduled Meds: . Chlorhexidine Gluconate Cloth  6 each Topical Daily  . cinacalcet  30 mg Oral Q breakfast  . levothyroxine  200 mcg Oral Q0600   Continuous Infusions: . cefTRIAXone (ROCEPHIN)  IV 1 g (01/10/20 0027)  . heparin 1,200 Units/hr (01/10/20  0722)  .  sodium bicarbonate (isotonic) infusion in sterile water 75 mL/hr at 01/09/20 2027     LOS: 1 day   Time Spent in minutes   45 minutes  Raynie Steinhaus D.O. on 01/10/2020 at 8:57 AM  Between 7am to 7pm - Please see pager noted on [email or web address]  After 7pm go to [email or web address]  And look for the night coverage person covering for me after hours  [facility] Office  [phone]

## 2020-01-10 NOTE — Progress Notes (Signed)
ANTICOAGULATION CONSULT NOTE - Follow Up Consult  Pharmacy Consult for heparin Indication: atrial fibrillation  Labs: Recent Labs    01/09/20 0207 01/09/20 2022 01/10/20 0607 01/10/20 0618 01/10/20 1447  HGB 13.2  --  12.7*  --   --   HCT 40.2  --  38.2*  --   --   PLT 195  --  188  --   --   APTT  --  118* 127*  --  75*  HEPARINUNFRC  --   --  >2.20*  --   --   CREATININE 7.88*  --  5.81*  --   --   CKTOTAL 838*  --   --  555*  --     Assessment: 86yo male with history of afib on apixaban prior to admit. Patient currently being bridge with IV heparin.  Aptt this afternoon is at goal (75s). Hematuria appears resolved. CBC stable.   Heparin level >2 d/t recent apixaban use, will continue to make dose adjustments based on aptt until the levels correlate.   Goal of Therapy:  aPTT 66-102 seconds   Plan:  Continue heparin at 1200 units/hr Daily heparin level, cbc, aptt  Erin Hearing PharmD., BCPS Clinical Pharmacist 01/10/2020 4:38 PM

## 2020-01-10 NOTE — Consult Note (Signed)
CARDIOLOGY CONSULT NOTE    Primary Care Physician: No primary care provider on file. Referring Physician: Ree Kida Cardiologist: Bettina Gavia EP: Curt Bears   Admit Date: 01/09/2020  Reason for consultation: atrial fibrillation   Caleb Lyons is a 86 y.o. male with a h/o obesity, OSA, SSS s/p STJ ICD implant, chronic diastolic heart failure, ischemic cardiomyopathy, and persistent atrial fibrillation.  He has been followed in our [place] office.  He has had physical decline and it is very difficult to get out of the house. He has home health at home currently and they have been trying to get hospice involved as it is becoming more difficult to care for him at home.  Last cardioversion in June of 2020 that maintained SR for about 8 weeks.  He had a remote device interrogation in December showed recurrent persistent atrial fibrillation. At that time, the patient was asymptomatic and rate control strategy was planned.  He was admitted to [facility] on 2/16 with left sided chest discomfort.  He was found to be in AKI with creatinine of 8.8 and transferred to [facility] for further evaluation. He was seen by nephrology and has been hydrated with improvement in renal function.  He has marked edema and chronic SOB. It appears that amiodarone was started at [facility] for atrial fibrillation during 09/2019 admission.  He remains in atypical atrial flutter since that time.  Cardiology has been asked to evaluate for treatment options.   Today, he denies symptoms of palpitations, chest pain,  orthopnea, PND,  dizziness, presyncope, syncope, or neurologic sequela.    Past Medical History:  Diagnosis Date  . Complete heart block ([place])   . Dual implantable cardiac defibrillator in situ    [employer]  . History of chicken pox   . HLD (hyperlipidemia)   . HTN (hypertension)   . Hyperparathyroidism   . Impotence of organic origin   . Ischemic cardiomyopathy    s/pCABG, Graft patent 3/11; EF 30-35%  . Obesity      . OSA (obstructive sleep apnea)   . PAF (paroxysmal atrial fibrillation) ([place])   . Sinus node dysfunction (HCC)   . Type II or unspecified type diabetes mellitus without mention of complication, not stated as uncontrolled    Past Surgical History:  Procedure Laterality Date  . CARDIOVERSION N/A 05/13/2019   Procedure: CARDIOVERSION;  Surgeon: Fay Records, MD;  Location: [facility] ENDOSCOPY;  Service: Cardiovascular;  Laterality: N/A;  . CORONARY ARTERY BYPASS GRAFT    . ICD GENERATOR CHANGEOUT N/A 01/18/2019   Procedure: ICD GENERATOR CHANGEOUT - Dual Chamber;  Surgeon: Deboraha Sprang, MD;  Location: [facility] CV LAB;  Service: Cardiovascular;  Laterality: N/A;  . TONSILLECTOMY      . Chlorhexidine Gluconate Cloth  6 each Topical Daily  . cinacalcet  30 mg Oral Q breakfast  . levothyroxine  200 mcg Oral Q0600   . cefTRIAXone (ROCEPHIN)  IV 1 g (01/10/20 0027)  . heparin 1,200 Units/hr (01/10/20 0722)  .  sodium bicarbonate (isotonic) infusion in sterile water 50 mL/hr at 01/10/20 1508    No Known Allergies  Social History   Socioeconomic History  . Marital status: Married    Spouse name: Not on file  . Number of children: Not on file  . Years of education: 31  . Highest education level: Not on file  Occupational History  . Occupation: Retired    Fish farm manager: RETIRED  Tobacco Use  . Smoking status: Never Smoker  . Smokeless tobacco:  Never Used  Substance and Sexual Activity  . Alcohol use: Yes  . Drug use: No  . Sexual activity: Not Currently  Other Topics Concern  . Not on file  Social History Narrative   Regular exercise-yes   Social Determinants of Health   Financial Resource Strain:   . Difficulty of Paying Living Expenses: Not on file  Food Insecurity:   . Worried About Charity fundraiser in the Last Year: Not on file  . Ran Out of Food in the Last Year: Not on file  Transportation Needs:   . Lack of Transportation (Medical): Not on file  . Lack of  Transportation (Non-Medical): Not on file  Physical Activity:   . Days of Exercise per Week: Not on file  . Minutes of Exercise per Session: Not on file  Stress:   . Feeling of Stress : Not on file  Social Connections:   . Frequency of Communication with Friends and Family: Not on file  . Frequency of Social Gatherings with Friends and Family: Not on file  . Attends Religious Services: Not on file  . Active Member of Clubs or Organizations: Not on file  . Attends Archivist Meetings: Not on file  . Marital Status: Not on file  Intimate Partner Violence:   . Fear of Current or Ex-Partner: Not on file  . Emotionally Abused: Not on file  . Physically Abused: Not on file  . Sexually Abused: Not on file    Family History  Problem Relation Age of Onset  . Early death Father   . Stroke Father     ROS- All systems are reviewed and negative except as per the HPI above  Physical Exam: Telemetry:  Atypical atrial flutter Vitals:   01/09/20 2035 01/10/20 0517 01/10/20 0547 01/10/20 0929  BP: (!) 104/57 115/60  (!) 110/57  Pulse: 63 70  66  Resp: 18 20  18   Temp: 97.8 F (36.6 C) 98.1 F (36.7 C)  97.7 F (36.5 C)  TempSrc: Oral Oral  Oral  SpO2: 99% 97%  93%  Weight:   (!) 181.2 kg   Height:        GEN- The patient is elderly and frail appearing, morbidly obese, alert and oriented x 3 today.   Head- normocephalic, atraumatic Eyes-  Sclera clear, conjunctiva pink Ears- hearing intact Oropharynx- clear Neck- supple, + JVD Lungs-   normal work of breathing Heart- Regular rate and rhythm (paced) GI- soft, NT, ND, + BS Extremities- no clubbing, cyanosis, 3+ edema MS- diffuse atrophy Skin- no rash or lesion Psych- euthymic mood, full affect Neuro- strength and sensation are intact  EKG- pending  Labs:   Lab Results  Component Value Date   WBC 8.7 01/10/2020   HGB 12.7 (L) 01/10/2020   HCT 38.2 (L) 01/10/2020   MCV 97.2 01/10/2020   PLT 188 01/10/2020      Recent Labs  Lab 01/09/20 0207 01/10/20 0607 01/10/20 0618  NA  --  140  --   K  --  3.4*  --   CL  --  104  --   CO2  --  21*  --   BUN  --  85*  --   CREATININE  --  5.81*  --   CALCIUM  --  8.2*  --   PROT   < >  --  5.5*  BILITOT   < >  --  0.7  ALKPHOS   < >  --  73  ALT   < >  --  25  AST   < >  --  41  GLUCOSE  --  98  --    < > = values in this interval not displayed.   Lab Results  Component Value Date   LPFXTKW 409 (H) 01/10/2020   CKMB 4.3 (H) 01/21/2010   TROPONINI (H) 01/21/2010    0.07        PERSISTENTLY INCREASED TROPONIN VALUES IN THE RANGE OF 0.06-0.49 ng/mL CAN BE SEEN IN:       -UNSTABLE ANGINA       -CONGESTIVE HEART FAILURE       -MYOCARDITIS       -CHEST TRAUMA       -ARRYHTHMIAS       -LATE PRESENTING MI       -COPD   CLINICAL FOLLOW-UP RECOMMENDED.    Lab Results  Component Value Date   CHOL 107 06/06/2016   Lab Results  Component Value Date   HDL 28 (L) 06/06/2016   Lab Results  Component Value Date   LDLCALC 36 06/06/2016   Lab Results  Component Value Date   TRIG 214 (H) 06/06/2016   Lab Results  Component Value Date   CHOLHDL 3.8 06/06/2016   No results found for: LDLDIRECT     Echo: 2017 echo reviewed   ASSESSMENT AND PLAN:  1.  Persistent atrial fibrillation/ atypical atrial flutter Asymptomatic atypical atrial flutter at 340 msec I was able to pace terminate atrial flutter today using his ICD and NIPS with pacing at CL of 210 msec. Stop amiodarone as this is unlikely to be beneficial long term CHADS2VASC is 6. Consider resumption of low dose Eliquis when renal function improves.  Continue heparin until renal function improves. Atrial arrhythmia detected changed from 170 to 150 bpm due to undersensing of atrial flutter.  2.  ICM/ acute on chronic systolic heart failure Markedly volume overloaded on exam No recent ischemic symptoms Use caution with hydration Very complicated and difficult to manage in the  setting of acute renal failure ICD interrogation is reviewed and normal today.  3.  AKI Improving Per nephrology  4.  Complete heart block Normal ICD function as above Atrial and RV pace/sense/ impedance values are unchanged from baseline.  autocapture was turned off in order to maintain appropriate outputs in the setting of AF with RVR  5. DNR I had a very long discussion with the patient today regarding end of life wishes.  He is clear that he wishes to be DNI/DNR.  I have advised that he consider turning ICD therapies off.  He is very clear that he wishes to keep ICD therapies enabled at this time.  Very complicated patient.  He is chronically quite ill with multiorgan failure.  Given advanced age and very poor prognosis, palliative options would seem appropriate.  General cardiology to follow while here.   Thompson Grayer, MD 01/10/2020  3:54 PM

## 2020-01-10 NOTE — Progress Notes (Signed)
Patient is on NIV at this time tolerating it well. Patient is using full face mask large. Patient states nasal mask didn't do well night before.

## 2020-01-10 NOTE — TOC Initial Note (Addendum)
Transition of Care [facility]) - Initial/Assessment Note    Patient Details  Name: DUWARD ALLBRITTON MRN: [id number] Date of Birth: [birth year]  Transition of Care [facility]) CM/SW Contact:    Carles Collet, RN Phone Number: 01/10/2020, 9:58 AM  Clinical Narrative:                 Spoke to patient's wife on the phone. She states that if HD is not started (we reviewed nephrology note stating he would not be a good HD candidate) that she would want him to go to a residential hospice. He is currently active w [facility] and her preference is [facility]  She states that she has already had to establish private duty to support the home hospice care and caring for him has become too difficult at home.  [place] [phone]) and updated them on current status and likelihood of residential hospice. They confirmed that they would recommend transferring service to [place] if residential needed.  TOC will continue to follow.   Wife's cell [phone]   Expected Discharge Plan: [place] Barriers to Discharge: Continued Medical Work up   Patient Goals and CMS Choice        Expected Discharge Plan and Services Expected Discharge Plan: [place]                                              Prior Living Arrangements/Services                       Activities of Daily Living      Permission Sought/Granted                  Emotional Assessment              Admission diagnosis:  Acute kidney failure [facility]) [N17.9] Patient Active Problem List   Diagnosis Date Noted  . Acute kidney failure ([place]) 01/09/2020  . Acute lower UTI 01/09/2020  . Cerebrovascular accident (CVA) due to thrombosis of basilar artery ([place])   . Carotid stenosis   . Essential hypertension   . Type 2 diabetes mellitus with circulatory disorder ([place])   . Coronary artery disease involving coronary bypass graft of  native heart with unspecified angina pectoris   . Ophthalmoplegia of both eyes 06/05/2016  . 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 Chronic diastolic heart failure ([place]) 12/18/2013  . cardiac defibrillator -dual-chamber-[employer] 06/08/2012  . Type II or unspecified type diabetes mellitus without mention of complication, not stated as uncontrolled   . HLD (hyperlipidemia)   . Hyperparathyroidism, primary ([place]) 11/29/2011  . Complete heart block ([place])   . Ischemic cardiomyopathy   . HTN (hypertension)   . Obesity   . OSA (obstructive sleep apnea)   . SYNCOPE 11/19/2009   PCP:  No primary care provider on file. Pharmacy:   [facility] [place], [address] AT [place] [address] [place] [address] Phone: [phone] Fax: [phone]     Social Determinants of Health (SDOH) Interventions    Readmission Risk Interventions No flowsheet data found.

## 2020-01-10 NOTE — Progress Notes (Signed)
ANTICOAGULATION CONSULT NOTE - Follow Up Consult  Pharmacy Consult for heparin Indication: atrial fibrillation  Labs: Recent Labs    01/09/20 0207 01/09/20 2022 01/10/20 0607  HGB 13.2  --  12.7*  HCT 40.2  --  38.2*  PLT 195  --  188  APTT  --  118* 127*  HEPARINUNFRC  --   --  >2.20*  CREATININE 7.88*  --  5.81*  CKTOTAL 838*  --   --     Assessment: 86yo male remains supratherapeutic on heparin with higher PTT despite decreased rate; no gtt issues or signs of bleeding per RN.  Goal of Therapy:  aPTT 66-102 seconds   Plan:  Will decrease heparin gtt by 1 units/kg/hr to 1200 units/hr and check level in 8 hours.    Wynona Neat, PharmD, BCPS  01/10/2020,7:18 AM

## 2020-01-10 NOTE — Plan of Care (Signed)
  Problem: Safety: Goal: Ability to remain free from injury will improve Outcome: Progressing   

## 2020-01-10 NOTE — Progress Notes (Signed)
Patient ID: Caleb Lyons, male   DOB: 07-13-1930, 86 y.o.   MRN: [id number]  S: Feels better today O:BP (!) 110/57 (BP Location: Right Arm)   Pulse 66   Temp 97.7 F (36.5 C) (Oral)   Resp 18   Ht 6\' 1"  (1.854 m)   Wt (!) 181.2 kg   SpO2 93%   BMI 52.71 kg/m   Intake/Output Summary (Last 24 hours) at 01/10/2020 1425 Last data filed at 01/10/2020 1300 Gross per 24 hour  Intake 3977.07 ml  Output 2500 ml  Net 1477.07 ml   Intake/Output: I/O last 3 completed shifts: In: 4970.2 [P.O.:1460; I.V.:3310.2; IV Piggyback:200] Out: 2500 [Urine:2500]  Intake/Output this shift:  Total I/O In: 480 [P.O.:480] Out: -  Weight change: -22 kg Gen: obese WM in NAD CVS: no rub Resp: decreased BS at bases Abd: obese, +BS, soft, NT/ND Ext: no edema  Recent Labs  Lab 01/09/20 0207 01/10/20 0607 01/10/20 0618  NA 139 140  --   K 3.4* 3.4*  --   CL 105 104  --   CO2 17* 21*  --   GLUCOSE 106* 98  --   BUN 91* 85*  --   CREATININE 7.88* 5.81*  --   ALBUMIN 2.9* 2.5* 2.4*  CALCIUM 8.5* 8.2*  --   PHOS  --  4.3  --   AST 42*  --  41  ALT 30  --  25   Liver Function Tests: Recent Labs  Lab 01/09/20 0207 01/10/20 0607 01/10/20 0618  AST 42*  --  41  ALT 30  --  25  ALKPHOS 76  --  73  BILITOT 0.7  --  0.7  PROT 5.9*  --  5.5*  ALBUMIN 2.9* 2.5* 2.4*   No results for input(s): LIPASE, AMYLASE in the last 168 hours. No results for input(s): AMMONIA in the last 168 hours. CBC: Recent Labs  Lab 01/09/20 0207 01/10/20 0607  WBC 11.8* 8.7  HGB 13.2 12.7*  HCT 40.2 38.2*  MCV 98.5 97.2  PLT 195 188   Cardiac Enzymes: Recent Labs  Lab 01/09/20 0207 01/10/20 0618  CKTOTAL 838* 555*   CBG: No results for input(s): GLUCAP in the last 168 hours.  Iron Studies: No results for input(s): IRON, TIBC, TRANSFERRIN, FERRITIN in the last 72 hours. Studies/Results: No results found. . Chlorhexidine Gluconate Cloth  6 each Topical Daily  . cinacalcet  30 mg Oral Q breakfast  .  levothyroxine  200 mcg Oral Q0600    BMET    Component Value Date/Time   NA 140 01/10/2020 0607   NA 141 05/21/2019 0902   K 3.4 (L) 01/10/2020 0607   CL 104 01/10/2020 0607   CO2 21 (L) 01/10/2020 0607   GLUCOSE 98 01/10/2020 0607   BUN 85 (H) 01/10/2020 0607   BUN 12 05/21/2019 0902   CREATININE 5.81 (H) 01/10/2020 0607   CALCIUM 8.2 (L) 01/10/2020 0607   CALCIUM 11.0 (H) 01/01/2013 1049   GFRNONAA 8 (L) 01/10/2020 0607   GFRAA 9 (L) 01/10/2020 0607   CBC    Component Value Date/Time   WBC 8.7 01/10/2020 0607   RBC 3.93 (L) 01/10/2020 0607   HGB 12.7 (L) 01/10/2020 0607   HGB 15.5 01/08/2019 1025   HCT 38.2 (L) 01/10/2020 0607   HCT 45.3 01/08/2019 1025   PLT 188 01/10/2020 0607   PLT 213 01/08/2019 1025   MCV 97.2 01/10/2020 0607   MCV 93 01/08/2019 1025  MCH 32.3 01/10/2020 0607   MCHC 33.2 01/10/2020 0607   RDW 17.2 (H) 01/10/2020 0607   RDW 13.9 01/08/2019 1025   LYMPHSABS 1.8 05/13/2019 0850   MONOABS 0.7 05/13/2019 0850   EOSABS 0.4 05/13/2019 0850   BASOSABS 0.1 05/13/2019 0850     Assessment/Plan:  1. AKI- presumably due to ischemic ATN in setting of hypovolemia and hypotenion with concomitant ARB and diuretics.  Significant improvement in UOP and BUN/CR with IVF's and holding ARB/diuretics.  Agree that he is a poor candidate for dialysis with his limited mobility, multiple co-morbidities, and advanced age.  His wife reports that he already has some hospice services at home.  2. Diarrhea- improved 3. Metabolic acidosis- improving with isotonic bicarb 4. Mild rhabdomyolysis- improving 5. UTI- GNR on ceftriaxone 6. SSS/P Afib- per cardiology 7. Chronic diastolic chf- will decrease IVF rate and follow 8. ICMP- stable 9. Disposition- pt is already involved with community hospice and may need to transition into residential hospice at discharge.   Donetta Potts, MD [employer] [phone]

## 2020-01-11 DIAGNOSIS — I4819 Other persistent atrial fibrillation: Secondary | ICD-10-CM

## 2020-01-11 LAB — RENAL FUNCTION PANEL
Albumin: 2.5 g/dL — ABNORMAL LOW (ref 3.5–5.0)
Anion gap: 15 (ref 5–15)
BUN: 71 mg/dL — ABNORMAL HIGH (ref 8–23)
CO2: 25 mmol/L (ref 22–32)
Calcium: 8.4 mg/dL — ABNORMAL LOW (ref 8.9–10.3)
Chloride: 101 mmol/L (ref 98–111)
Creatinine, Ser: 4.08 mg/dL — ABNORMAL HIGH (ref 0.61–1.24)
GFR calc Af Amer: 14 mL/min — ABNORMAL LOW (ref >60)
GFR calc non Af Amer: 12 mL/min — ABNORMAL LOW (ref >60)
Glucose, Bld: 100 mg/dL — ABNORMAL HIGH (ref 70–99)
Phosphorus: 3.3 mg/dL (ref 2.5–4.6)
Potassium: 3 mmol/L — ABNORMAL LOW (ref 3.5–5.1)
Sodium: 141 mmol/L (ref 135–145)

## 2020-01-11 LAB — CBC
HCT: 37.9 % — ABNORMAL LOW (ref 39.0–52.0)
Hemoglobin: 12.7 g/dL — ABNORMAL LOW (ref 13.0–17.0)
MCH: 32.6 pg (ref 26.0–34.0)
MCHC: 33.5 g/dL (ref 30.0–36.0)
MCV: 97.2 fL (ref 80.0–100.0)
Platelets: 182 10*3/uL (ref 150–400)
RBC: 3.9 MIL/uL — ABNORMAL LOW (ref 4.22–5.81)
RDW: 17.2 % — ABNORMAL HIGH (ref 11.5–15.5)
WBC: 8.1 10*3/uL (ref 4.0–10.5)
nRBC: 0 % (ref 0.0–0.2)

## 2020-01-11 LAB — HEPARIN LEVEL (UNFRACTIONATED): Heparin Unfractionated: 1.42 IU/mL — ABNORMAL HIGH (ref 0.30–0.70)

## 2020-01-11 LAB — APTT: aPTT: 71 seconds — ABNORMAL HIGH (ref 24–36)

## 2020-01-11 LAB — MAGNESIUM: Magnesium: 1.1 mg/dL — ABNORMAL LOW (ref 1.7–2.4)

## 2020-01-11 MED ORDER — POTASSIUM CHLORIDE CRYS ER 20 MEQ PO TBCR
40.0000 meq | EXTENDED_RELEASE_TABLET | Freq: Once | ORAL | Status: AC
  Administered 2020-01-11: 40 meq via ORAL
  Filled 2020-01-11: qty 2

## 2020-01-11 MED ORDER — MAGNESIUM SULFATE 4 GM/100ML IV SOLN
4.0000 g | Freq: Once | INTRAVENOUS | Status: AC
  Administered 2020-01-11: 4 g via INTRAVENOUS
  Filled 2020-01-11: qty 100

## 2020-01-11 MED ORDER — SODIUM CHLORIDE 0.9 % IV SOLN: INTRAVENOUS | Status: DC

## 2020-01-11 NOTE — Progress Notes (Signed)
Patient ID: Caleb Lyons, male   DOB: 1930-01-31, 86 y.o.   MRN: [id number] S: No new complaints.  Good UOP and BUN/Cr continue to slowly improve O:BP 132/75 (BP Location: Right Arm)   Pulse 79   Temp 98.5 F (36.9 C) (Oral)   Resp 18   Ht 6\' 1"  (1.854 m)   Wt (!) 181.4 kg   SpO2 98%   BMI 52.77 kg/m   Intake/Output Summary (Last 24 hours) at 01/11/2020 1236 Last data filed at 01/11/2020 0946 Gross per 24 hour  Intake 3026.38 ml  Output 1800 ml  Net 1226.38 ml   Intake/Output: I/O last 3 completed shifts: In: 4502.4 [P.O.:1520; I.V.:2682.4; IV Piggyback:300] Out: 2300 [Urine:2300]  Intake/Output this shift:  Total I/O In: 200 [P.O.:200] Out: 400 [Urine:400] Weight change: 0.227 kg Gen: obese, NAD CVS: faint HS Resp: cta Abd: obese, +BS, soft Ext: no edema  Recent Labs  Lab 01/09/20 0207 01/10/20 0607 01/10/20 0618 01/11/20 0734  NA 139 140  --  141  K 3.4* 3.4*  --  3.0*  CL 105 104  --  101  CO2 17* 21*  --  25  GLUCOSE 106* 98  --  100*  BUN 91* 85*  --  71*  CREATININE 7.88* 5.81*  --  4.08*  ALBUMIN 2.9* 2.5* 2.4* 2.5*  CALCIUM 8.5* 8.2*  --  8.4*  PHOS  --  4.3  --  3.3  AST 42*  --  41  --   ALT 30  --  25  --    Liver Function Tests: Recent Labs  Lab 01/09/20 0207 01/09/20 0207 01/10/20 0607 01/10/20 0618 01/11/20 0734  AST 42*  --   --  41  --   ALT 30  --   --  25  --   ALKPHOS 76  --   --  73  --   BILITOT 0.7  --   --  0.7  --   PROT 5.9*  --   --  5.5*  --   ALBUMIN 2.9*   < > 2.5* 2.4* 2.5*   < > = values in this interval not displayed.   No results for input(s): LIPASE, AMYLASE in the last 168 hours. No results for input(s): AMMONIA in the last 168 hours. CBC: Recent Labs  Lab 01/09/20 0207 01/10/20 0607 01/11/20 0734  WBC 11.8* 8.7 8.1  HGB 13.2 12.7* 12.7*  HCT 40.2 38.2* 37.9*  MCV 98.5 97.2 97.2  PLT 195 188 182   Cardiac Enzymes: Recent Labs  Lab 01/09/20 0207 01/10/20 0618  CKTOTAL 838* 555*   CBG: No results  for input(s): GLUCAP in the last 168 hours.  Iron Studies: No results for input(s): IRON, TIBC, TRANSFERRIN, FERRITIN in the last 72 hours. Studies/Results: No results found. . Chlorhexidine Gluconate Cloth  6 each Topical Daily  . cinacalcet  30 mg Oral Q breakfast  . levothyroxine  200 mcg Oral Q0600    BMET    Component Value Date/Time   NA 141 01/11/2020 0734   NA 141 05/21/2019 0902   K 3.0 (L) 01/11/2020 0734   CL 101 01/11/2020 0734   CO2 25 01/11/2020 0734   GLUCOSE 100 (H) 01/11/2020 0734   BUN 71 (H) 01/11/2020 0734   BUN 12 05/21/2019 0902   CREATININE 4.08 (H) 01/11/2020 0734   CALCIUM 8.4 (L) 01/11/2020 0734   CALCIUM 11.0 (H) 01/01/2013 1049   GFRNONAA 12 (L) 01/11/2020 0734   GFRAA 14 (  L) 01/11/2020 0734   CBC    Component Value Date/Time   WBC 8.1 01/11/2020 0734   RBC 3.90 (L) 01/11/2020 0734   HGB 12.7 (L) 01/11/2020 0734   HGB 15.5 01/08/2019 1025   HCT 37.9 (L) 01/11/2020 0734   HCT 45.3 01/08/2019 1025   PLT 182 01/11/2020 0734   PLT 213 01/08/2019 1025   MCV 97.2 01/11/2020 0734   MCV 93 01/08/2019 1025   MCH 32.6 01/11/2020 0734   MCHC 33.5 01/11/2020 0734   RDW 17.2 (H) 01/11/2020 0734   RDW 13.9 01/08/2019 1025   LYMPHSABS 1.8 05/13/2019 0850   MONOABS 0.7 05/13/2019 0850   EOSABS 0.4 05/13/2019 0850   BASOSABS 0.1 05/13/2019 0850      Assessment/Plan:  1. AKI- presumably due to ischemic ATN in setting of hypovolemia and hypotenion with concomitant ARB and diuretics.  Significant improvement in UOP and BUN/CR with IVF's and holding ARB/diuretics.  Agree that he is a poor candidate for dialysis with his limited mobility, multiple co-morbidities, and advanced age.  His wife reports that he already has some hospice services at home and is desperate for him to be placed in residential hospice.  SW involved. 1. Will stop isotonic bicarb and change to NS 2. Recommend continuing IVF"s for another 24 hours and see if he can manage PO  intake. 3. Not a candidate for HD and nothing further to add.  Call with questions or concerns.  No need for outpatient follow uup. 2. Diarrhea- improved 3. Metabolic acidosis- improving with isotonic bicarb 4. Mild rhabdomyolysis- improving 5. UTI- GNR on ceftriaxone 6. SSS/P Afib- per cardiology 7. Chronic diastolic chf- will decrease IVF rate and follow 8. ICMP- stable 9. Disposition- pt is already involved with community hospice and may need to transition into residential hospice at discharge.  If he doesn't qualify for hospice, he will at least require SNF placement.  Donetta Potts, MD [employer] [phone]

## 2020-01-11 NOTE — Progress Notes (Addendum)
ANTICOAGULATION CONSULT NOTE - Follow Up Consult  Pharmacy Consult for heparin Indication: atrial fibrillation  Labs: Recent Labs    01/09/20 0207 01/09/20 2022 01/10/20 0607 01/10/20 0618 01/10/20 1447 01/11/20 0433  HGB 13.2  --  12.7*  --   --   --   HCT 40.2  --  38.2*  --   --   --   PLT 195  --  188  --   --   --   APTT  --    < > 127*  --  75* 71*  HEPARINUNFRC  --   --  >2.20*  --   --  1.42*  CREATININE 7.88*  --  5.81*  --   --   --   CKTOTAL 838*  --   --  555*  --   --    < > = values in this interval not displayed.    Assessment: 86yo male with history of afib on apixaban prior to admit. Patient currently being bridged with IV heparin.  Aptt this afternoon is at goal (71s). Hgb dropped slightly from 13.2 to 12.7 yesterday. 12.7 again this morning. Platelets WNL. RN reports an episode of mild hematuria overnight that has subsequently resolved.   Heparin level 1.42 d/t recent apixaban use, will continue to make dose adjustments based on aptt until the levels correlate.   Goal of Therapy:  aPTT 66-102 seconds   Plan:  Continue heparin at 1200 units/hr Daily heparin level, cbc, aptt Monitor for s/sx of bleeding  Agnes Lawrence, PharmD PGY1 Pharmacy Resident

## 2020-01-11 NOTE — Progress Notes (Signed)
PROGRESS NOTE    Caleb Lyons  RAQ:[id number] DOB: July 05, 1930 DOA: 01/09/2020 PCP: No primary care provider on file.   Brief Narrative:  HPI On 01/09/2020 by Dr. Jennette Kettle Caleb Lyons is a 86 y.o. male with medical history significant of obesity, OSA, SSS s/p PPM / AICD, chronic diastolic CHF, ICM. Pt presented to [facility] ED on 2/16 with L sided chest discomfort.  Trop neg x3. Was found to have AKF with creat of 8.8, BUN 89.  This significantly worse than his baseline creat of 1.2 just last month. It was suspected that dehydration / pre-renal may be to blame as he was started on lasix 20mg  BID and when that didn't work for peripheral edema it was increased to 40mg  BID and he was on a very fluid restricted diet. As a result he got 6L+ total fluid at [facility] over course of 2/17. After 5L of that though, kidney function didn't show significant improvement, only had 900cc UOP, initally urine appearing but now changed to hematuria. UA showed UTI, UCx growing 100k CFU of a GNR of some sort, started on rocephin. Bicarb of 19, anion gap of 20. WBC 13k. BPs have been running on the low side, especially initially during stay when he came in with systolics in the 45G.  Now 115/58. Looks like creat after the medicine consult went down to 7.7, BUN 91 though, so not sure that's much of an improvement. Patient finally able to get bed and transferred over to [facility] now.  Interim history Admitted with AKI. Nephrology consulted.   Assessment & Plan   Acute kidney injury -Creatinine on admission 7.88 (baseline 1.2) -Unknown etiology, possibly dehydration, ATN with low blood pressures, Lasix use, urinary retention -Patient received approximately 6 L of IV fluid at an outside facility -Continue IV fluid -Monitor intake and output-patient does have Foley catheter in place- noted have some hematuria in foley on 2/18 (?traumatic placement), no output. Foley was replaced, good UOP, clear urine at this  time. -Nephrology consulted and appreciated- patient is not a candidate for HD. Has some home hospice services. May need to transition to residential hospice on discharge  -CT renal study did not show any evidence of obstruction -Creatinine down to 4.08 today  UTI -Patient had 100k GNR at outside facility -[facility], grew Ecoli -Continue ceftriaxone  Mild rhabdomyolysis -CK 838 on admission, down to 555 today -doubt this is contributing to AKI -statin held -Continue IVF  Sick sinus syndrome/Paroxsymal Atrial fibrillation -Patient with pacemaker placement -Hold Eliquis, will place on heparin -hold metoprolol, amiodarone (per [facility] patient is on amiodarone 200mg  TID) -of note, patient was seen by Dr. Curt Bears 08/05/19, amiodarone was not mentioned -Called Dr. Bettina Gavia who has not seen this patient but was able to tell me that he was started on amiodarone 200mg  TID on 10/09/20.  -supposedly Medtronic device was interrogated on 01/08/20, which showed persistent AF -Cardiology consulted and appreciated- recommended discontinuing Eliquis  Hypothyroidism -Continue Synthroid  Chronic diastolic heart failure -Currently feeling some SOB -has been receiving IVF for AKI -Continue to monitor I/O, daily weights -diuretics held due to AKI  BPH -Foley in place -Flomax and detrol held  Diabetes mellitus, type II -possibly diet controlled, on no medications at home  Hypokalemia -potassium down to 3 today, will order supplementation and order magnesium level -monitor BMP  DVT Prophylaxis  heparin  Code Status: DNR  Family Communication: None at bedside  Disposition Plan: Admitted from outside hospital (likely from home) for  AKI. Nephro following. Dispo TBD  Consultants Nephrology  Cardiology/EP  Procedures  None  Antibiotics   Anti-infectives (From admission, onward)   Start     Dose/Rate Route Frequency Ordered Stop   01/09/20 0100  cefTRIAXone (ROCEPHIN) 1 g in  sodium chloride 0.9 % 100 mL IVPB     1 g 200 mL/hr over 30 Minutes Intravenous Every 24 hours 01/09/20 0047        Subjective:   Caleb Lyons seen and examined today.  States he feels short of breath this morning. He denies chest pain, abdominal pain, N/V/D/C.   Objective:   Vitals:   01/10/20 0929 01/10/20 1700 01/10/20 2105 01/11/20 0455  BP: (!) 110/57 129/76 119/68 (!) 108/53  Pulse: 66 91 85 81  Resp: 18 18 20 18   Temp: 97.7 F (36.5 C) 98.1 F (36.7 C) 97.8 F (36.6 C) 98 F (36.7 C)  TempSrc: Oral Oral Oral Oral  SpO2: 93% 97% 98% 96%  Weight:   (!) 181.4 kg   Height:        Intake/Output Summary (Last 24 hours) at 01/11/2020 0846 Last data filed at 01/11/2020 0600 Gross per 24 hour  Intake 3066.38 ml  Output 1400 ml  Net 1666.38 ml   Filed Weights   01/09/20 0700 01/10/20 0547 01/10/20 2105  Weight: (!) 203.2 kg (!) 181.2 kg (!) 181.4 kg   Exam  General: Well developed, chronically ill appearing, NAD, appears stated age  42: NCAT, mucous membranes moist.   Cardiovascular: S1 S2 auscultated, RRR  Respiratory: Clear to auscultation bilaterally with equal chest rise  Abdomen: Soft, obese, nontender, nondistended, + bowel sounds  Extremities: warm dry without cyanosis clubbing. LE edema B/L  Neuro: AAOx3, nonfocal  Psych: Appropriate mood and affect, pleasant  Data Reviewed: I have personally reviewed following labs and imaging studies  CBC: Recent Labs  Lab 01/09/20 0207 01/10/20 0607 01/11/20 0734  WBC 11.8* 8.7 8.1  HGB 13.2 12.7* 12.7*  HCT 40.2 38.2* 37.9*  MCV 98.5 97.2 97.2  PLT 195 188 701   Basic Metabolic Panel: Recent Labs  Lab 01/09/20 0207 01/10/20 0607 01/11/20 0734  NA 139 140 141  K 3.4* 3.4* 3.0*  CL 105 104 101  CO2 17* 21* 25  GLUCOSE 106* 98 100*  BUN 91* 85* 71*  CREATININE 7.88* 5.81* 4.08*  CALCIUM 8.5* 8.2* 8.4*  PHOS  --  4.3 3.3   GFR: Estimated Creatinine Clearance: 21.7 mL/min (A) (by C-G formula  based on SCr of 4.08 mg/dL (H)). Liver Function Tests: Recent Labs  Lab 01/09/20 0207 01/10/20 0607 01/10/20 0618 01/11/20 0734  AST 42*  --  41  --   ALT 30  --  25  --   ALKPHOS 76  --  73  --   BILITOT 0.7  --  0.7  --   PROT 5.9*  --  5.5*  --   ALBUMIN 2.9* 2.5* 2.4* 2.5*   No results for input(s): LIPASE, AMYLASE in the last 168 hours. No results for input(s): AMMONIA in the last 168 hours. Coagulation Profile: No results for input(s): INR, PROTIME in the last 168 hours. Cardiac Enzymes: Recent Labs  Lab 01/09/20 0207 01/10/20 0618  CKTOTAL 838* 555*   BNP (last 3 results) No results for input(s): PROBNP in the last 8760 hours. HbA1C: No results for input(s): HGBA1C in the last 72 hours. CBG: No results for input(s): GLUCAP in the last 168 hours. Lipid Profile: No results for input(s):  CHOL, HDL, LDLCALC, TRIG, CHOLHDL, LDLDIRECT in the last 72 hours. Thyroid Function Tests: No results for input(s): TSH, T4TOTAL, FREET4, T3FREE, THYROIDAB in the last 72 hours. Anemia Panel: No results for input(s): VITAMINB12, FOLATE, FERRITIN, TIBC, IRON, RETICCTPCT in the last 72 hours. Urine analysis:    Component Value Date/Time   COLORURINE YELLOW 01/09/2020 1501   APPEARANCEUR CLOUDY (A) 01/09/2020 1501   LABSPEC 1.017 01/09/2020 1501   PHURINE 9.0 (H) 01/09/2020 1501   GLUCOSEU 150 (A) 01/09/2020 1501   HGBUR MODERATE (A) 01/09/2020 1501   BILIRUBINUR NEGATIVE 01/09/2020 1501   KETONESUR 5 (A) 01/09/2020 1501   PROTEINUR >=300 (A) 01/09/2020 1501   NITRITE NEGATIVE 01/09/2020 1501   LEUKOCYTESUR SMALL (A) 01/09/2020 1501   Sepsis Labs: @LABRCNTIP (procalcitonin:4,lacticidven:4)  ) Recent Results (from the past 240 hour(s))  Culture, blood (routine x 2)     Status: None (Preliminary result)   Collection Time: 01/09/20  2:07 AM   Specimen: BLOOD  Result Value Ref Range Status   Specimen Description BLOOD RIGHT ANTECUBITAL  Final   Special Requests   Final     BOTTLES DRAWN AEROBIC AND ANAEROBIC Blood Culture adequate volume Performed at [facility], [place] [address], [place], [place] [address]    Culture NO GROWTH 2 DAYS  Final   Report Status PENDING  Incomplete  Culture, blood (routine x 2)     Status: None (Preliminary result)   Collection Time: 01/09/20  2:21 AM   Specimen: BLOOD LEFT HAND  Result Value Ref Range Status   Specimen Description BLOOD LEFT HAND  Final   Special Requests   Final    AEROBIC BOTTLE ONLY Blood Culture adequate volume Performed at [facility], [place] [address], [place], [place] [address]    Culture NO GROWTH 2 DAYS  Final   Report Status PENDING  Incomplete  SARS CORONAVIRUS 2 (TAT 6-24 HRS) Nasopharyngeal Nasopharyngeal Swab     Status: None   Collection Time: 01/09/20  3:19 AM   Specimen: Nasopharyngeal Swab  Result Value Ref Range Status   SARS Coronavirus 2 NEGATIVE NEGATIVE Final    Comment: (NOTE) SARS-CoV-2 target nucleic acids are NOT DETECTED. The SARS-CoV-2 RNA is generally detectable in upper and lower respiratory specimens during the acute phase of infection. Negative results do not preclude SARS-CoV-2 infection, do not rule out co-infections with other pathogens, and should not be used as the sole basis for treatment or other patient management decisions. Negative results must be combined with clinical observations, patient history, and epidemiological information. The expected result is Negative. Fact Sheet for Patients: SugarRoll.be Fact Sheet for Healthcare Providers: https://www.woods-mathews.com/ This test is not yet approved or cleared by the [place] FDA and  has been authorized for detection and/or diagnosis of SARS-CoV-2 by FDA under an Emergency Use Authorization (EUA). This EUA will remain  in effect (meaning this test can be used) for the duration of the COVID-19 declaration under Section 56 4(b)(1) of the Act, 21  U.S.C. section 360bbb-3(b)(1), unless the authorization is terminated or revoked sooner. Performed at [facility], [place] [address], [place], [place] [address]       Radiology Studies: No results found.   Scheduled Meds: . Chlorhexidine Gluconate Cloth  6 each Topical Daily  . cinacalcet  30 mg Oral Q breakfast  . levothyroxine  200 mcg Oral Q0600   Continuous Infusions: . cefTRIAXone (ROCEPHIN)  IV 1 g (01/11/20 0035)  . heparin 1,200 Units/hr (01/11/20 0734)  .  sodium bicarbonate (isotonic) infusion in sterile water 50 mL/hr at 01/11/20 0414     LOS: 2 days   Time Spent in minutes   45 minutes  Devere Brem D.O. on 01/11/2020 at 8:46 AM  Between 7am to 7pm - Please see pager noted on [email or web address]  After 7pm go to [email or web address]  And look for the night coverage person covering for me after hours  [facility] Office  [phone]

## 2020-01-11 NOTE — Progress Notes (Signed)
   Progress Note  Patient Name: SKANDA WORLDS Date of Encounter: 01/11/2020  Primary Cardiologist: Shirlee More, MD  Brief follow-up note.  Please refer to full consultation by Dr. Rayann Heman on February 19.  Telemetry shows a ventricular paced rhythm.  Patient has persistent atrial fibrillation/atypical atrial flutter (flutter was terminated yesterday via ICD per Dr. Rayann Heman).  Amiodarone was discontinued, reasonable in light of recurrent arrhythmia and low likelihood of long-term benefit.  He is on heparin at this time, Eliquis was discontinued in light of other active comorbidities including acute renal failure.  At this point only active cardiac medication is heparin.  We will continue to follow.  Signed, Rozann Lesches, MD  01/11/2020, 1:16 PM

## 2020-01-11 NOTE — Progress Notes (Signed)
Patient is currently on his home CPAP. RT will continue to monitor.

## 2020-01-12 LAB — CBC
HCT: 37.3 % — ABNORMAL LOW (ref 39.0–52.0)
Hemoglobin: 12.3 g/dL — ABNORMAL LOW (ref 13.0–17.0)
MCH: 32 pg (ref 26.0–34.0)
MCHC: 33 g/dL (ref 30.0–36.0)
MCV: 97.1 fL (ref 80.0–100.0)
Platelets: 200 10*3/uL (ref 150–400)
RBC: 3.84 MIL/uL — ABNORMAL LOW (ref 4.22–5.81)
RDW: 17.2 % — ABNORMAL HIGH (ref 11.5–15.5)
WBC: 7.5 10*3/uL (ref 4.0–10.5)
nRBC: 0 % (ref 0.0–0.2)

## 2020-01-12 LAB — RENAL FUNCTION PANEL
Albumin: 2.4 g/dL — ABNORMAL LOW (ref 3.5–5.0)
Anion gap: 14 (ref 5–15)
BUN: 55 mg/dL — ABNORMAL HIGH (ref 8–23)
CO2: 26 mmol/L (ref 22–32)
Calcium: 8.5 mg/dL — ABNORMAL LOW (ref 8.9–10.3)
Chloride: 105 mmol/L (ref 98–111)
Creatinine, Ser: 2.93 mg/dL — ABNORMAL HIGH (ref 0.61–1.24)
GFR calc Af Amer: 21 mL/min — ABNORMAL LOW (ref >60)
GFR calc non Af Amer: 18 mL/min — ABNORMAL LOW (ref >60)
Glucose, Bld: 108 mg/dL — ABNORMAL HIGH (ref 70–99)
Phosphorus: 2.7 mg/dL (ref 2.5–4.6)
Potassium: 3.3 mmol/L — ABNORMAL LOW (ref 3.5–5.1)
Sodium: 145 mmol/L (ref 135–145)

## 2020-01-12 LAB — APTT: aPTT: 81 seconds — ABNORMAL HIGH (ref 24–36)

## 2020-01-12 LAB — MAGNESIUM: Magnesium: 1.6 mg/dL — ABNORMAL LOW (ref 1.7–2.4)

## 2020-01-12 LAB — HEPARIN LEVEL (UNFRACTIONATED): Heparin Unfractionated: 1.09 IU/mL — ABNORMAL HIGH (ref 0.30–0.70)

## 2020-01-12 MED ORDER — POTASSIUM CHLORIDE CRYS ER 20 MEQ PO TBCR
40.0000 meq | EXTENDED_RELEASE_TABLET | Freq: Once | ORAL | Status: AC
  Administered 2020-01-12: 40 meq via ORAL
  Filled 2020-01-12: qty 2

## 2020-01-12 MED ORDER — BISACODYL 5 MG PO TBEC
5.0000 mg | DELAYED_RELEASE_TABLET | Freq: Every day | ORAL | Status: DC | PRN
  Administered 2020-01-12: 5 mg via ORAL
  Filled 2020-01-12: qty 1

## 2020-01-12 MED ORDER — MAGNESIUM SULFATE 4 GM/100ML IV SOLN
4.0000 g | Freq: Once | INTRAVENOUS | Status: AC
  Administered 2020-01-12: 4 g via INTRAVENOUS
  Filled 2020-01-12: qty 100

## 2020-01-12 NOTE — TOC Progression Note (Signed)
Transition of Care [facility]) - Progression Note    Patient Details  Name: Caleb Lyons MRN: [id number] Date of Birth: 1930/02/16  Transition of Care [facility]) CM/SW [place], [place] Phone Number: [phone] 01/12/2020, 11:45 AM  Clinical Narrative:    CSW met bedside with patient and patient's wife. CSW informed them that [place] has been contacted and CSW is waiting for a return call. Patient's wife expressed she would also be interested in a hospice closer to their home in [place]. CSW reached out to [place] and is awaiting a return call. CSW will continue to follow and assist with discharge needs.   Expected Discharge Plan: [place] Barriers to Discharge: Continued Medical Work up  Expected Discharge Plan and Services Expected Discharge Plan: [place]                                               Social Determinants of Health (SDOH) Interventions    Readmission Risk Interventions No flowsheet data found.

## 2020-01-12 NOTE — Progress Notes (Addendum)
PROGRESS NOTE    Caleb Lyons  FWY:[id number] DOB: 11-11-1930 DOA: 01/09/2020 PCP: No primary care provider on file.   Brief Narrative:  HPI On 01/09/2020 by Dr. Jennette Kettle Caleb Lyons is a 86 y.o. male with medical history significant of obesity, OSA, SSS s/p PPM / AICD, chronic diastolic CHF, ICM. Pt presented to [facility] ED on 2/16 with L sided chest discomfort.  Trop neg x3. Was found to have AKF with creat of 8.8, BUN 89.  This significantly worse than his baseline creat of 1.2 just last month. It was suspected that dehydration / pre-renal may be to blame as he was started on lasix 20mg  BID and when that didn't work for peripheral edema it was increased to 40mg  BID and he was on a very fluid restricted diet. As a result he got 6L+ total fluid at [facility] over course of 2/17. After 5L of that though, kidney function didn't show significant improvement, only had 900cc UOP, initally urine appearing but now changed to hematuria. UA showed UTI, UCx growing 100k CFU of a GNR of some sort, started on rocephin. Bicarb of 19, anion gap of 20. WBC 13k. BPs have been running on the low side, especially initially during stay when he came in with systolics in the 27X.  Now 115/58. Looks like creat after the medicine consult went down to 7.7, BUN 91 though, so not sure that's much of an improvement. Patient finally able to get bed and transferred over to [facility] now.  Interim history Admitted with AKI. Nephrology consulted. Creatinine improving. Cardiology consulted for atrial fib.  Assessment & Plan   Acute kidney injury -Creatinine on admission 7.88 (baseline 1.2) -Unknown etiology, possibly dehydration, ATN with low blood pressures, Lasix use, urinary retention -Patient received approximately 6 L of IV fluid at an outside facility -Continue IV fluid -Monitor intake and output-patient does have Foley catheter in place- noted have some hematuria in foley on 2/18 (?traumatic placement), no output. Foley  was replaced, good UOP, clear urine at this time. -Nephrology consulted and appreciated- patient is not a candidate for HD. Has some home hospice services. May need to transition to residential hospice on discharge  -CT renal study did not show any evidence of obstruction -Creatinine down to 2.93 today  UTI -Patient had 100k GNR at outside facility -[facility], grew [place] -Continue ceftriaxone- for duration of 7 days total through 2/24  Mild rhabdomyolysis -CK 838 on admission, down to 555  -doubt this is contributing to AKI -statin held -Continue IVF  Sick sinus syndrome/Paroxsymal Atrial fibrillation -Patient with pacemaker placement -Eliquis held, place on heparin given AKI -hold metoprolol, amiodarone (per [facility] patient is on amiodarone 200mg  TID) -of note, patient was seen by Dr. Curt Bears 08/05/19, amiodarone was not mentioned -Called Dr. Bettina Gavia who has not seen this patient but was able to tell me that he was started on amiodarone 200mg  TID on 10/09/20.  -supposedly Medtronic device was interrogated on 01/08/20, which showed persistent AF -Cardiology consulted and appreciated- recommended discontinuing Amiodarone as there is low likelihood of long-term benefit.   Hypothyroidism -Continue Synthroid  Chronic diastolic heart failure -Currently feeling some SOB -has been receiving IVF for AKI -Continue to monitor I/O, daily weights -diuretics held due to AKI  BPH -Foley in place -Flomax and detrol held  Diabetes mellitus, type II -possibly diet controlled, on no medications at home  Hypokalemia -potassium down to 3.3 today, continue to replace -monitor BMP  Hypomagnesemia  -magnesium 1.1, given  4g of supplementation -magnesium up to 1.6 today, will continue to supplement  Deconditioning -discussed with wife, patient has been immobile for about 4 months. He has used a walker at home, but more to sit on and get from room to room. She states his legs cannot  support him.   Morbid obesity  -BMI 52.77  Goals of care -patient currently DNR -has been followed by community hospice -discussed with wife, she would like patient to be placed in residential hospice in [place] or [place] consulted   DVT Prophylaxis  heparin  Code Status: DNR  Family Communication: None at bedside. Wife via phone.  Disposition Plan: Admitted from outside hospital (likely from home) for AKI. Nephro and cardiology following. Dispo TBD  Consultants Nephrology  Cardiology/EP  Procedures  None  Antibiotics   Anti-infectives (From admission, onward)   Start     Dose/Rate Route Frequency Ordered Stop   01/09/20 0100  cefTRIAXone (ROCEPHIN) 1 g in sodium chloride 0.9 % 100 mL IVPB     1 g 200 mL/hr over 30 Minutes Intravenous Every 24 hours 01/09/20 0047        Subjective:   [name] seen and examined today.  Feels better this morning. Denies current chest pain, shortness of breath, abdominal pain, N/V/D/C. States he feels weak and has not been up in a while. Uses a walker at home.   Objective:   Vitals:   01/11/20 0943 01/11/20 1749 01/11/20 2120 01/12/20 0454  BP: 132/75 130/82 135/71 129/65  Pulse: 79 90 84 78  Resp: 18 18 20 18   Temp: 98.5 F (36.9 C) 98.5 F (36.9 C) 98.4 F (36.9 C) 98.3 F (36.8 C)  TempSrc: Oral Oral  Oral  SpO2: 98% 96% 92% 94%  Weight:      Height:        Intake/Output Summary (Last 24 hours) at 01/12/2020 0759 Last data filed at 01/12/2020 0500 Gross per 24 hour  Intake 1561.56 ml  Output 2750 ml  Net -1188.44 ml   Filed Weights   01/09/20 0700 01/10/20 0547 01/10/20 2105  Weight: (!) 203.2 kg (!) 181.2 kg (!) 181.4 kg   Exam  General: Well developed, chronically ill appearing, NAD  HEENT: NCAT, mucous membranes moist.   Cardiovascular: S1 S2 auscultated, RRR  Respiratory: Clear to auscultation bilaterally   Abdomen: Soft, obese, nontender, nondistended, + bowel sounds  Extremities: warm  dry without cyanosis clubbing. Trace LE edema  Neuro: AAOx3, nonfocal  Psych: Appropriate mood and affect, pleasant   Data Reviewed: I have personally reviewed following labs and imaging studies  CBC: Recent Labs  Lab 01/09/20 0207 01/10/20 0607 01/11/20 0734 01/12/20 0423  WBC 11.8* 8.7 8.1 7.5  HGB 13.2 12.7* 12.7* 12.3*  HCT 40.2 38.2* 37.9* 37.3*  MCV 98.5 97.2 97.2 97.1  PLT 195 188 182 323   Basic Metabolic Panel: Recent Labs  Lab 01/09/20 0207 01/10/20 0607 01/11/20 0734 01/12/20 0423  NA 139 140 141 145  K 3.4* 3.4* 3.0* 3.3*  CL 105 104 101 105  CO2 17* 21* 25 26  GLUCOSE 106* 98 100* 108*  BUN 91* 85* 71* 55*  CREATININE 7.88* 5.81* 4.08* 2.93*  CALCIUM 8.5* 8.2* 8.4* 8.5*  MG  --   --  1.1* 1.6*  PHOS  --  4.3 3.3 2.7   GFR: Estimated Creatinine Clearance: 30.3 mL/min (A) (by C-G formula based on SCr of 2.93 mg/dL (H)). Liver Function Tests: Recent Labs  Lab 01/09/20 0207  01/10/20 0607 01/10/20 0618 01/11/20 0734 01/12/20 0423  AST 42*  --  41  --   --   ALT 30  --  25  --   --   ALKPHOS 76  --  73  --   --   BILITOT 0.7  --  0.7  --   --   PROT 5.9*  --  5.5*  --   --   ALBUMIN 2.9* 2.5* 2.4* 2.5* 2.4*   No results for input(s): LIPASE, AMYLASE in the last 168 hours. No results for input(s): AMMONIA in the last 168 hours. Coagulation Profile: No results for input(s): INR, PROTIME in the last 168 hours. Cardiac Enzymes: Recent Labs  Lab 01/09/20 0207 01/10/20 0618  CKTOTAL 838* 555*   BNP (last 3 results) No results for input(s): PROBNP in the last 8760 hours. HbA1C: No results for input(s): HGBA1C in the last 72 hours. CBG: No results for input(s): GLUCAP in the last 168 hours. Lipid Profile: No results for input(s): CHOL, HDL, LDLCALC, TRIG, CHOLHDL, LDLDIRECT in the last 72 hours. Thyroid Function Tests: No results for input(s): TSH, T4TOTAL, FREET4, T3FREE, THYROIDAB in the last 72 hours. Anemia Panel: No results for  input(s): VITAMINB12, FOLATE, FERRITIN, TIBC, IRON, RETICCTPCT in the last 72 hours. Urine analysis:    Component Value Date/Time   COLORURINE YELLOW 01/09/2020 1501   APPEARANCEUR CLOUDY (A) 01/09/2020 1501   LABSPEC 1.017 01/09/2020 1501   PHURINE 9.0 (H) 01/09/2020 1501   GLUCOSEU 150 (A) 01/09/2020 1501   HGBUR MODERATE (A) 01/09/2020 1501   BILIRUBINUR NEGATIVE 01/09/2020 1501   KETONESUR 5 (A) 01/09/2020 1501   PROTEINUR >=300 (A) 01/09/2020 1501   NITRITE NEGATIVE 01/09/2020 1501   LEUKOCYTESUR SMALL (A) 01/09/2020 1501   Sepsis Labs: @LABRCNTIP (procalcitonin:4,lacticidven:4)  ) Recent Results (from the past 240 hour(s))  Culture, blood (routine x 2)     Status: None (Preliminary result)   Collection Time: 01/09/20  2:07 AM   Specimen: BLOOD  Result Value Ref Range Status   Specimen Description BLOOD RIGHT ANTECUBITAL  Final   Special Requests   Final    BOTTLES DRAWN AEROBIC AND ANAEROBIC Blood Culture adequate volume Performed at [facility], [address], [place], [place] [address]    Culture NO GROWTH 2 DAYS  Final   Report Status PENDING  Incomplete  Culture, blood (routine x 2)     Status: None (Preliminary result)   Collection Time: 01/09/20  2:21 AM   Specimen: BLOOD LEFT HAND  Result Value Ref Range Status   Specimen Description BLOOD LEFT HAND  Final   Special Requests   Final    AEROBIC BOTTLE ONLY Blood Culture adequate volume Performed at [facility], [place] [address] [place], [place] [address]    Culture NO GROWTH 2 DAYS  Final   Report Status PENDING  Incomplete  SARS CORONAVIRUS 2 (TAT 6-24 HRS) Nasopharyngeal Nasopharyngeal Swab     Status: None   Collection Time: 01/09/20  3:19 AM   Specimen: Nasopharyngeal Swab  Result Value Ref Range Status   SARS Coronavirus 2 NEGATIVE NEGATIVE Final    Comment: (NOTE) SARS-CoV-2 target nucleic acids are NOT DETECTED. The SARS-CoV-2 RNA is generally detectable in upper and lower respiratory  specimens during the acute phase of infection. Negative results do not preclude SARS-CoV-2 infection, do not rule out co-infections with other pathogens, and should not be used as the sole basis for treatment or other patient management decisions.  Negative results must be combined with clinical observations, patient history, and epidemiological information. The expected result is Negative. Fact Sheet for Patients: SugarRoll.be Fact Sheet for Healthcare Providers: https://www.woods-mathews.com/ This test is not yet approved or cleared by the [place] FDA and  has been authorized for detection and/or diagnosis of SARS-CoV-2 by FDA under an Emergency Use Authorization (EUA). This EUA will remain  in effect (meaning this test can be used) for the duration of the COVID-19 declaration under Section 56 4(b)(1) of the Act, 21 U.S.C. section 360bbb-3(b)(1), unless the authorization is terminated or revoked sooner. Performed at [facility], [place] [address], [place], [place] [address]       Radiology Studies: No results found.   Scheduled Meds: . Chlorhexidine Gluconate Cloth  6 each Topical Daily  . cinacalcet  30 mg Oral Q breakfast  . levothyroxine  200 mcg Oral Q0600   Continuous Infusions: . sodium chloride 50 mL/hr at 01/11/20 1251  . cefTRIAXone (ROCEPHIN)  IV 1 g (01/12/20 0032)  . heparin 1,200 Units/hr (01/11/20 0734)     LOS: 3 days   Time Spent in minutes   45 minutes  Tabathia Knoche D.O. on 01/12/2020 at 7:59 AM  Between 7am to 7pm - Please see pager noted on [email or web address]  After 7pm go to [email or web address]  And look for the night coverage person covering for me after hours  [facility] Office  [phone]

## 2020-01-12 NOTE — Progress Notes (Signed)
ANTICOAGULATION CONSULT NOTE - Follow Up Consult  Pharmacy Consult for heparin Indication: atrial fibrillation  Labs: Recent Labs    01/10/20 0607 01/10/20 0607 01/10/20 0618 01/10/20 1447 01/11/20 0433 01/11/20 0734 01/12/20 0423  HGB 12.7*   < >  --   --   --  12.7* 12.3*  HCT 38.2*  --   --   --   --  37.9* 37.3*  PLT 188  --   --   --   --  182 200  APTT 127*   < >  --  75* 71*  --  81*  HEPARINUNFRC >2.20*  --   --   --  1.42*  --  1.09*  CREATININE 5.81*  --   --   --   --  4.08* 2.93*  CKTOTAL  --   --  555*  --   --   --   --    < > = values in this interval not displayed.    Assessment: 86yo male with history of AF on apixaban prior to admit. Patient currently being treated with IV heparin.   Aptt this afternoon is at goal (81s) at heparin IV 1200 units/hr. Hgb dropped slightly from 12.7 to 12.3. Platelets WNL. No infusion issues or bleeding per the RN.   Heparin level 1.09 d/t recent apixaban use, will continue to make dose adjustments based on aptt until the levels correlate.   Goal of Therapy:  aPTT 66-102 seconds   Plan:  Continue heparin at 1200 units/hr Daily heparin level, cbc, aptt Monitor for s/sx of bleeding  Agnes Lawrence, PharmD PGY1 Pharmacy Resident

## 2020-01-12 NOTE — Progress Notes (Signed)
Patient applied CPAP and ready for bed. Call bell In reach.

## 2020-01-12 NOTE — Progress Notes (Addendum)
Update: CSW contacted patient's wife and did not receive an answer. CSW spoke with nurse who stated patient was on the phone with his wife. CSW informed them [facility] have not made contact as of yet.  CSW spoke with [place]. CSW informed patient is not a candidate for their residential hospice. They are able to offer patient home hospice.   CSW waiting to hear back from [place].  Criss Alvine, [place]

## 2020-01-12 NOTE — Plan of Care (Signed)

## 2020-01-12 NOTE — Plan of Care (Signed)
  Problem: Clinical Measurements: Goal: Ability to maintain clinical measurements within normal limits will improve Outcome: Progressing   

## 2020-01-12 NOTE — Progress Notes (Signed)
Patient placed himself on home CPAP unit for the night with oxygen set at 2lpm.

## 2020-01-13 DIAGNOSIS — Z515 Encounter for palliative care: Secondary | ICD-10-CM

## 2020-01-13 DIAGNOSIS — R531 Weakness: Secondary | ICD-10-CM

## 2020-01-13 DIAGNOSIS — N184 Chronic kidney disease, stage 4 (severe): Secondary | ICD-10-CM

## 2020-01-13 DIAGNOSIS — I4892 Unspecified atrial flutter: Secondary | ICD-10-CM

## 2020-01-13 DIAGNOSIS — N179 Acute kidney failure, unspecified: Secondary | ICD-10-CM

## 2020-01-13 DIAGNOSIS — Z66 Do not resuscitate: Secondary | ICD-10-CM

## 2020-01-13 LAB — CBC
HCT: 39 % (ref 39.0–52.0)
Hemoglobin: 12.8 g/dL — ABNORMAL LOW (ref 13.0–17.0)
MCH: 32.2 pg (ref 26.0–34.0)
MCHC: 32.8 g/dL (ref 30.0–36.0)
MCV: 98 fL (ref 80.0–100.0)
Platelets: 208 10*3/uL (ref 150–400)
RBC: 3.98 MIL/uL — ABNORMAL LOW (ref 4.22–5.81)
RDW: 17.5 % — ABNORMAL HIGH (ref 11.5–15.5)
WBC: 7.3 10*3/uL (ref 4.0–10.5)
nRBC: 0 % (ref 0.0–0.2)

## 2020-01-13 LAB — HEPARIN LEVEL (UNFRACTIONATED): Heparin Unfractionated: 0.82 IU/mL — ABNORMAL HIGH (ref 0.30–0.70)

## 2020-01-13 LAB — BASIC METABOLIC PANEL
Anion gap: 12 (ref 5–15)
BUN: 42 mg/dL — ABNORMAL HIGH (ref 8–23)
CO2: 27 mmol/L (ref 22–32)
Calcium: 8.8 mg/dL — ABNORMAL LOW (ref 8.9–10.3)
Chloride: 105 mmol/L (ref 98–111)
Creatinine, Ser: 2.15 mg/dL — ABNORMAL HIGH (ref 0.61–1.24)
GFR calc Af Amer: 31 mL/min — ABNORMAL LOW (ref >60)
GFR calc non Af Amer: 27 mL/min — ABNORMAL LOW (ref >60)
Glucose, Bld: 110 mg/dL — ABNORMAL HIGH (ref 70–99)
Potassium: 3.5 mmol/L (ref 3.5–5.1)
Sodium: 144 mmol/L (ref 135–145)

## 2020-01-13 LAB — MAGNESIUM: Magnesium: 1.9 mg/dL (ref 1.7–2.4)

## 2020-01-13 LAB — CK: Total CK: 70 U/L (ref 49–397)

## 2020-01-13 LAB — APTT: aPTT: 79 seconds — ABNORMAL HIGH (ref 24–36)

## 2020-01-13 NOTE — TOC Progression Note (Signed)
Transition of Care [facility]) - Progression Note    Patient Details  Name: Caleb Lyons MRN: [id number] Date of Birth: 09/23/1930  Transition of Care [facility]) CM/SW Contact  Bartholomew Crews, RN Phone Number: [phone] 01/13/2020, 5:35 PM  Clinical Narrative:    [facility] faxed out to [place] in search of long term bed. [facility] is ALF and patient's care needs are too great for ALF. Voicemail left for [facility].   Spoke with Education officer, museum at [place] to discuss patient options. Discussed patient completing eligibility form and faxing form to [place]. Social worker noted that patient had appeared at some point to have initiated the process but had not completed setting up with PCP. Spoke with patient at the bedside who remembered that he chose to not participate in [place] services, because he did not want to giver up part of his retirement.   Discussed with patient barriers in finding a long term bed. Discussed hiring in home care through an agency that he trusts to cover morning and evening hours. Discussed that if worker is not doing their job, then they don't need to keep their job. Patient states that his wife could never fire anyone.   Spoke with liaison at [place] who will reach out to the wife about hiring a new agency and offer a list of choices not affiliated with [facility].   Patient stated that that someone from [facility] had come by about turning off his pacemaker. Patient stated that the pacemaker did him well for 20 years, but no longer was adding to his quality of life.   Patient tearful at times when talking about returning home and being with family, but he knows he needs more help. He is not opposed to long term care, but would like to be home. Patient expressed appreciation.   TOC following for transition needs.   Expected Discharge Plan: [place] Barriers to Discharge: Continued Medical Work up  Expected Discharge Plan and Services Expected  Discharge Plan: [place]                                               Social Determinants of Health (SDOH) Interventions    Readmission Risk Interventions No flowsheet data found.

## 2020-01-13 NOTE — Progress Notes (Signed)
[facility]: Joya Salm  Referral received yesterday for [facility] He was not felt to be appropriate and was not approved for this level of care by our physician at [facility].  He was however felt to be appropriate for hospice care at home. The wife was unsure yesterday if she would be able to met his needs when he got home and was thinking out this.  He is noted to be a DNR and we would like to consider having the AICD deactivated in hospital prior to him leaving if they do agree to hospice care at home.   We will continue to work with the wife to offer support and answer question hoping to proceed with hospice services in home if they agree.   Webb Silversmith RN [phone]

## 2020-01-13 NOTE — Consult Note (Signed)
Consultation Note Date: 01/13/2020   Patient Name: Caleb Lyons  DOB: [birth year]  MRN: [id number]  Age / Sex: 86 y.o., male  PCP: No primary care provider on file. Referring Physician: Cristal Ford, DO  Reason for Consultation: Establishing goals of care and Psychosocial/spiritual support  HPI/Patient Profile: 86 y.o. male   admitted on 01/09/2020 with past medical history significant ofobesity, OSA, SSS s/p PPM / AICD, chronic diastolic CHF, ICM.  Was found to have AKF with creat of 8.8, BUN 89. This significantly worse than his baseline creat of 1.2 just last month.  With treatments today creatine is 2.15   Poor candidate for OP dialysis  As patient prepares for transition from hospital.   Patient and his family face treatment option decisions, advanced directive decisions and anticipatory care needs.   Clinical Assessment and Goals of Care:   This NP Wadie Lessen reviewed medical records, received report from team, assessed the patient and then meet at the patient's bedside  to discuss diagnosis, prognosis, GOC, EOL wishes disposition and options.  Concept of  Palliative Care was discussed  I then discussed above with wife.  We plan to meet tomorrow at 1200  A discussion was had today regarding advanced directives.  Concepts specific to code status, artifical feeding and hydration, continued IV antibiotics and rehospitalization was had.  We discussed de-activation of ICD   The difference between a aggressive medical intervention path  and a palliative comfort care path for this patient at this time was had.  Values and goals of care important to patient and family were attempted to be elicited.  Patient clearly tells me he wants " to get somewhere they can care for me, near home, so my wife can visit".  On further exploration he communicates no desire for life prolonging measures and wants  a natural death, agrees to deactivate AICD   Questions and concerns addressed.   Family encouraged to call with questions or concerns.    PMT will continue to support holistically.  Patient has capacity to make his own decisions at this time, wife is next of kin    SUMMARY OF RECOMMENDATIONS    Code Status/Advance Care Planning:  DNR   Palliative Prophylaxis:   Aspiration, frequent pain assessment  Additional Recommendations (Limitations, Scope, Preferences):  Full Comfort Care  Psycho-social/Spiritual:   Desire for further Chaplaincy support:yes  Additional Recommendations: education on hospice  Prognosis:   Less than six months  Discharge Planning: Likely SNF with palliative vs hospice services     Primary Diagnoses: Present on Admission: . Acute kidney failure ([place]) . Type 2 diabetes mellitus with circulatory disorder ([place]) . Ischemic cardiomyopathy . Acute lower UTI . HTN (hypertension)   I have reviewed the medical record, interviewed the patient and family, and examined the patient. The following aspects are pertinent.  Past Medical History:  Diagnosis Date  . Complete heart block ([place])   . Dual implantable cardiac defibrillator in situ    [employer]  . History of chicken pox   .  HLD (hyperlipidemia)   . HTN (hypertension)   . Hyperparathyroidism   . Impotence of organic origin   . Ischemic cardiomyopathy    s/pCABG, Graft patent 3/11; EF 30-35%  . Obesity       . OSA (obstructive sleep apnea)   . PAF (paroxysmal atrial fibrillation) ([place])   . Sinus node dysfunction (H[facility])   . Type II or unspecified type diabetes mellitus without mention of complication, not stated as uncontrolled    Social History   Socioeconomic History  . Marital status: Married    Spouse name: Not on file  . Number of children: Not on file  . Years of education: 31  . Highest education level: Not on file  Occupational History  . Occupation: Retired    Fish farm manager:  RETIRED  Tobacco Use  . Smoking status: Never Smoker  . Smokeless tobacco: Never Used  Substance and Sexual Activity  . Alcohol use: Yes  . Drug use: No  . Sexual activity: Not Currently  Other Topics Concern  . Not on file  Social History Narrative   Regular exercise-yes   Social Determinants of Health   Financial Resource Strain:   . Difficulty of Paying Living Expenses: Not on file  Food Insecurity:   . Worried About Charity fundraiser in the Last Year: Not on file  . Ran Out of Food in the Last Year: Not on file  Transportation Needs:   . Lack of Transportation (Medical): Not on file  . Lack of Transportation (Non-Medical): Not on file  Physical Activity:   . Days of Exercise per Week: Not on file  . Minutes of Exercise per Session: Not on file  Stress:   . Feeling of Stress : Not on file  Social Connections:   . Frequency of Communication with Friends and Family: Not on file  . Frequency of Social Gatherings with Friends and Family: Not on file  . Attends Religious Services: Not on file  . Active Member of Clubs or Organizations: Not on file  . Attends Archivist Meetings: Not on file  . Marital Status: Not on file   Family History  Problem Relation Age of Onset  . Early death Father   . Stroke Father    Scheduled Meds: . Chlorhexidine Gluconate Cloth  6 each Topical Daily  . cinacalcet  30 mg Oral Q breakfast  . levothyroxine  200 mcg Oral Q0600   Continuous Infusions: . sodium chloride 50 mL/hr at 01/12/20 1302  . cefTRIAXone (ROCEPHIN)  IV 1 g (01/13/20 0022)  . heparin 1,200 Units/hr (01/13/20 0138)   PRN Meds:.acetaminophen **OR** acetaminophen, bisacodyl, ondansetron **OR** ondansetron (ZOFRAN) IV Medications Prior to Admission:  Prior to Admission medications   Medication Sig Start Date End Date Taking? Authorizing Provider  allopurinol (ZYLOPRIM) 100 MG tablet Take 100 mg by mouth daily.    Yes [provider]  amiodarone  (PACERONE) 200 MG tablet Take 200 mg by mouth 3 (three) times daily.   Yes [provider]  apixaban (ELIQUIS) 5 MG TABS tablet Take 1 tablet (5 mg total) by mouth 2 (two) times daily. 10/30/18  Yes Sherran Needs, NP  atorvastatin (LIPITOR) 40 MG tablet Take 40 mg by mouth every evening.    Yes [provider]  cinacalcet (SENSIPAR) 30 MG tablet Take 1 tablet (30 mg total) by mouth daily with breakfast. Patient taking differently: Take 30 mg by mouth at bedtime.  04/21/14  Yes Renato Shin, MD  COLCRYS 0.6 MG tablet Take 0.6 mg by mouth at bedtime.    Yes [provider]  diphenoxylate-atropine (LOMOTIL) 2.5-0.025 MG tablet Take 2 tablets by mouth 4 (four) times daily as needed for diarrhea or loose stools.   Yes [provider]  furosemide (LASIX) 20 MG tablet Take 20 mg by mouth daily.    Yes [provider]  GLUCOSAMINE-CHONDROITIN PO Take 2 tablets by mouth daily.    Yes [provider]  hydrALAZINE (APRESOLINE) 10 MG tablet Take 10 mg by mouth 4 (four) times daily.   Yes [provider]  levothyroxine (SYNTHROID, LEVOTHROID) 200 MCG tablet Take 200 mcg by mouth daily before breakfast.   Yes [provider]  Menthol, Topical Analgesic, (BENGAY EX) Apply 1 application topically daily as needed (pain).   Yes [provider]  metoprolol (TOPROL-XL) 50 MG 24 hr tablet Take 50 mg by mouth daily.    Yes [provider]  Misc Natural Products (PROSTATE SUPPORT PO) Take 600 mg by mouth 2 (two) times daily. Super Beta Prostate    Yes [provider]  Multiple Vitamin (MULTIVITAMIN WITH MINERALS) TABS tablet Take 1 tablet by mouth daily.   Yes [provider]  omeprazole (PRILOSEC) 20 MG capsule Take 20 mg by mouth daily.   Yes [provider]  oxymetazoline (AFRIN) 0.05 % nasal spray Place 1 spray into both nostrils at bedtime.   Yes [provider]  potassium chloride (K-DUR)  10 MEQ tablet Take 10 mEq by mouth 2 (two) times daily.   Yes [provider]  senna-docusate (SENOKOT-S) 8.6-50 MG tablet Take 1 tablet by mouth 2 (two) times daily as needed for mild constipation.   Yes [provider]  tamsulosin (FLOMAX) 0.4 MG CAPS capsule Take 0.4 mg by mouth at bedtime.   Yes [provider]  tolterodine (DETROL LA) 4 MG 24 hr capsule Take 4 mg by mouth daily.   Yes [provider]  traMADol (ULTRAM) 50 MG tablet Take 50 mg by mouth every 6 (six) hours as needed for moderate pain.   Yes [provider]  valsartan (DIOVAN) 160 MG tablet Take 160 mg by mouth daily.   Yes [provider]  vitamin C (ASCORBIC ACID) 500 MG tablet Take 500 mg by mouth every evening.    Yes [provider]   No Known Allergies Review of Systems  Neurological: Positive for weakness.    Physical Exam Constitutional:      Appearance: He is overweight.  Cardiovascular:     Rate and Rhythm: Normal rate.  Skin:    General: Skin is warm and dry.  Neurological:     Mental Status: He is alert.     Vital Signs: BP 118/68 (BP Location: Right Arm)   Pulse (!) 56   Temp 98.3 F (36.8 C) (Oral)   Resp 18   Ht 6\' 1"  (1.854 m)   Wt (!) 181.4 kg   SpO2 95%   BMI 52.77 kg/m  Pain Scale: 0-10   Pain Score: 0-No pain   SpO2: SpO2: 95 % O2 Device:SpO2: 95 % O2 Flow Rate: .O2 Flow Rate (L/min): 2 L/min  IO: Intake/output summary:   Intake/Output Summary (Last 24 hours) at 01/13/2020 1452 Last data filed at 01/13/2020 1259 Gross per 24 hour  Intake 2829.77 ml  Output 2630 ml  Net 199.77 ml    LBM: Last BM Date: 01/12/20 Baseline Weight: Weight: (!) 203.2 kg Most recent weight: Weight: Marland Kitchen)  181.4 kg     Palliative Assessment/Data: 30 % at best   Discussed with LCSW/Vanessa  Time In: 1345 Time Out: 1500 Time Total: 75 minutes Greater than 50%  of this time was spent counseling and coordinating care related to the  above assessment and plan.  Signed by: Wadie Lessen, NP   Please contact Palliative Medicine Team phone at [phone] for questions and concerns.  For individual provider: See Shea Evans

## 2020-01-13 NOTE — Plan of Care (Signed)
  Problem: Education: Goal: Knowledge of General Education information will improve Description: Including pain rating scale, medication(s)/side effects and non-pharmacologic comfort measures Outcome: Progressing   Problem: Nutrition: Goal: Adequate nutrition will be maintained Outcome: Progressing   Problem: Safety: Goal: Ability to remain free from injury will improve Outcome: Progressing   

## 2020-01-13 NOTE — Progress Notes (Signed)
Palliative Medicine RN Note: Consult order noted for residential hospice, but [facility] cannot accept for residential. Spoke with SW Lorriane Shire; she will pursue other options w family. However, they live in [place].  Caleb Skiff [name], RN, BSN, [facility] Palliative Medicine Team 01/13/2020 3:10 PM Office [phone]

## 2020-01-13 NOTE — Progress Notes (Signed)
Patient has home CPAP at bedside with 2L O2 bled into circuit and places himself on without assistance. RT will monitor as needed.

## 2020-01-13 NOTE — NC FL2 (Signed)
[place] LEVEL OF CARE SCREENING TOOL     IDENTIFICATION  Patient Name: Caleb Lyons Birthdate: 1930-10-10 Sex: male Admission Date (Current Location): 01/09/2020  [facility] and [place] Number:  Herbalist and Address:  [facility] [place] [address], [place], [place] [address]      Provider Number: [id number]  Attending Physician Name and Address:  Cristal Ford, DO  Relative Name and Phone Number:       Current Level of Care: Hospital Recommended Level of Care: Nursing Facility Prior Approval Number:    Date Approved/Denied: 10/15/19 PASRR Number: [id number]  Discharge Plan: Other (Comment)(long term care with hospice services)    Current Diagnoses: Patient Active Problem List   Diagnosis Date Noted  . Atrial flutter ([place])   . Persistent atrial fibrillation ([place])   . Acute kidney failure ([place]) 01/09/2020  . Acute lower UTI 01/09/2020  . Cerebrovascular accident (CVA) due to thrombosis of basilar artery ([place])   . Carotid stenosis   . Essential hypertension   . Type 2 diabetes mellitus with circulatory disorder ([place])   . Coronary artery disease involving coronary bypass graft of native heart with unspecified angina pectoris   . Ophthalmoplegia of both eyes 06/05/2016  . 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 Chronic diastolic heart failure ([place]) 12/18/2013  . cardiac defibrillator -dual-chamber-[employer] 06/08/2012  . Type II or unspecified type diabetes mellitus without mention of complication, not stated as uncontrolled   . HLD (hyperlipidemia)   . Hyperparathyroidism, primary ([place]) 11/29/2011  . Complete heart block ([place])   . Ischemic cardiomyopathy   . HTN (hypertension)   . Obesity   . OSA (obstructive sleep apnea)   . SYNCOPE 11/19/2009    Orientation RESPIRATION BLADDER Height & Weight     Self, Time, Situation, Place  O2 Continent Weight: (!) 181.4 kg Height:  6\' 1"  (185.4 cm)  BEHAVIORAL SYMPTOMS/MOOD  NEUROLOGICAL BOWEL NUTRITION STATUS      Incontinent Diet  AMBULATORY STATUS COMMUNICATION OF NEEDS Skin   Extensive Assist Verbally Skin abrasions, Other (Comment)(abrasions to arms; moisture associated breakdown to groin/buttocks)                       Personal Care Assistance Level of Assistance  Bathing, Dressing, Feeding Bathing Assistance: Maximum assistance Feeding assistance: Limited assistance Dressing Assistance: Maximum assistance     Functional Limitations Info  Sight, Hearing, Speech Sight Info: Adequate Hearing Info: Adequate Speech Info: Adequate    SPECIAL CARE FACTORS FREQUENCY                       Contractures Contractures Info: Not present    Additional Factors Info  Code Status, Allergies Code Status Info: DNR Allergies Info: None Known           Current Medications (01/13/2020):  This is the current hospital active medication list Current Facility-Administered Medications  Medication Dose Route Frequency Provider Last Rate Last Admin  . 0.9 %  sodium chloride infusion   Intravenous Continuous Donato Heinz, MD 50 mL/hr at 01/12/20 1302 New Bag at 01/12/20 1302  . acetaminophen (TYLENOL) tablet 650 mg  650 mg Oral Q6H PRN Etta Quill, DO   650 mg at 01/13/20 0025   Or  . acetaminophen (TYLENOL) suppository 650 mg  650 mg Rectal Q6H PRN Etta Quill, DO      . bisacodyl (DULCOLAX) EC tablet 5 mg  5  mg Oral Daily PRN Lovey Newcomer T, NP   5 mg at 01/12/20 0025  . cefTRIAXone (ROCEPHIN) 1 g in sodium chloride 0.9 % 100 mL IVPB  1 g Intravenous Q24H Mikhail, [place], DO 200 mL/hr at 01/13/20 0022 1 g at 01/13/20 0022  . Chlorhexidine Gluconate Cloth 2 % PADS 6 each  6 each Topical Daily Cristal Ford, DO   6 each at 01/13/20 [phone]  . cinacalcet (SENSIPAR) tablet 30 mg  30 mg Oral Q breakfast Cristal Ford, DO   30 mg at 01/13/20 0855  . heparin ADULT infusion 100 units/mL (25000 units/265mL sodium chloride 0.45%)  1,200  Units/hr Intravenous Continuous Laren Everts, RPH 12 mL/hr at 01/13/20 0138 1,200 Units/hr at 01/13/20 0138  . levothyroxine (SYNTHROID) tablet 200 mcg  200 mcg Oral Q0600 Cristal Ford, DO   200 mcg at 01/13/20 0500  . ondansetron (ZOFRAN) tablet 4 mg  4 mg Oral Q6H PRN Etta Quill, DO       Or  . ondansetron [facility]) injection 4 mg  4 mg Intravenous Q6H PRN Etta Quill, DO         Discharge Medications: Please see discharge summary for a list of discharge medications.  Relevant Imaging Results:  Relevant Lab Results:   Additional Information SSN [id number]; bariatric  Bartholomew Crews, RN

## 2020-01-13 NOTE — Progress Notes (Signed)
DAILY PROGRESS NOTE   Patient Name: Caleb Lyons Date of Encounter: 01/13/2020 Cardiologist: Shirlee More, MD  Chief Complaint   No complaints.  Patient Profile   86 yo male with afib/fluter s/p AICD, presented with chest pain and was in acute renal failure and UTI, had flutter pace-terminated by Dr. Rayann Heman and amiodarone stopped.  Subjective   Wants to go home - no cardiac complaints. Appears volume overloaded. EKG shows paced rhythm. On IV heparin.  Objective   Vitals:   01/12/20 0935 01/12/20 1742 01/12/20 2143 01/13/20 0434  BP: 134/69 131/75 (!) 105/53 136/70  Pulse: 82 92 79 74  Resp: 18 18 16 18   Temp: 98.4 F (36.9 C) 97.9 F (36.6 C) 98.5 F (36.9 C) 98 F (36.7 C)  TempSrc: Oral Oral Oral Oral  SpO2: 96% 95% 97% 94%  Weight:      Height:        Intake/Output Summary (Last 24 hours) at 01/13/2020 0857 Last data filed at 01/13/2020 0600 Gross per 24 hour  Intake 2634.27 ml  Output 2530 ml  Net 104.27 ml   Filed Weights   01/09/20 0700 01/10/20 0547 01/10/20 2105  Weight: (!) 203.2 kg (!) 181.2 kg (!) 181.4 kg    Physical Exam   General appearance: alert, no distress and morbidly obese Neck: no carotid bruit, thyroid not enlarged, symmetric, no tenderness/mass/nodules and JVP could not be assessed Lungs: diminished breath sounds bilaterally Heart: regular rate and rhythm Abdomen: mobidly obese Extremities: edema 3+ edema Pulses: 2+ and symmetric Skin: pale, warm, dry Neurologic: Mental status: Alert, oriented, thought content appropriate Psych: Pleasant  Inpatient Medications    Scheduled Meds: . Chlorhexidine Gluconate Cloth  6 each Topical Daily  . cinacalcet  30 mg Oral Q breakfast  . levothyroxine  200 mcg Oral Q0600    Continuous Infusions: . sodium chloride 50 mL/hr at 01/12/20 1302  . cefTRIAXone (ROCEPHIN)  IV 1 g (01/13/20 0022)  . heparin 1,200 Units/hr (01/13/20 0138)    PRN Meds: acetaminophen **OR** acetaminophen,  bisacodyl, ondansetron **OR** ondansetron (ZOFRAN) IV   Labs   Results for orders placed or performed during the hospital encounter of 01/09/20 (from the past 48 hour(s))  CBC     Status: Abnormal   Collection Time: 01/12/20  4:23 AM  Result Value Ref Range   WBC 7.5 4.0 - 10.5 K/uL   RBC 3.84 (L) 4.22 - 5.81 MIL/uL   Hemoglobin 12.3 (L) 13.0 - 17.0 g/dL   HCT 37.3 (L) 39.0 - 52.0 %   MCV 97.1 80.0 - 100.0 fL   MCH 32.0 26.0 - 34.0 pg   MCHC 33.0 30.0 - 36.0 g/dL   RDW 17.2 (H) 11.5 - 15.5 %   Platelets 200 150 - 400 K/uL   nRBC 0.0 0.0 - 0.2 %    Comment: Performed at [facility], [address] [place], [place] [address]  Heparin level (unfractionated)     Status: Abnormal   Collection Time: 01/12/20  4:23 AM  Result Value Ref Range   Heparin Unfractionated 1.09 (H) 0.30 - 0.70 IU/mL    Comment: (NOTE) If heparin results are below expected values, and patient dosage has  been confirmed, suggest follow up testing of antithrombin III levels. Performed at [facility], [place] [address] [place], [place] [address]   APTT     Status: Abnormal   Collection Time: 01/12/20  4:23 AM  Result Value Ref Range   aPTT  81 (H) 24 - 36 seconds    Comment:        IF BASELINE aPTT IS ELEVATED, SUGGEST PATIENT RISK ASSESSMENT BE USED TO DETERMINE APPROPRIATE ANTICOAGULANT THERAPY. Performed at [facility], [place] [address], [place], [place] [address]   Renal function panel     Status: Abnormal   Collection Time: 01/12/20  4:23 AM  Result Value Ref Range   Sodium 145 135 - 145 mmol/L   Potassium 3.3 (L) 3.5 - 5.1 mmol/L   Chloride 105 98 - 111 mmol/L   CO2 26 22 - 32 mmol/L   Glucose, Bld 108 (H) 70 - 99 mg/dL   BUN 55 (H) 8 - 23 mg/dL   Creatinine, Ser 2.93 (H) 0.61 - 1.24 mg/dL    Comment: DELTA CHECK NOTED   Calcium 8.5 (L) 8.9 - 10.3 mg/dL   Phosphorus 2.7 2.5 - 4.6 mg/dL   Albumin 2.4 (L) 3.5 - 5.0 g/dL   GFR calc non Af Amer 18 (L) >60 mL/min   GFR calc Af  Amer 21 (L) >60 mL/min   Anion gap 14 5 - 15    Comment: Performed at [facility] [address] [place], [place] [address]  Magnesium     Status: Abnormal   Collection Time: 01/12/20  4:23 AM  Result Value Ref Range   Magnesium 1.6 (L) 1.7 - 2.4 mg/dL    Comment: Performed at [facility] [address] [place], [place] [address]  CBC     Status: Abnormal   Collection Time: 01/13/20  4:18 AM  Result Value Ref Range   WBC 7.3 4.0 - 10.5 K/uL   RBC 3.98 (L) 4.22 - 5.81 MIL/uL   Hemoglobin 12.8 (L) 13.0 - 17.0 g/dL   HCT 39.0 39.0 - 52.0 %   MCV 98.0 80.0 - 100.0 fL   MCH 32.2 26.0 - 34.0 pg   MCHC 32.8 30.0 - 36.0 g/dL   RDW 17.5 (H) 11.5 - 15.5 %   Platelets 208 150 - 400 K/uL   nRBC 0.0 0.0 - 0.2 %    Comment: Performed at [facility], [place] [address], [place], [place] [address]  Heparin level (unfractionated)     Status: Abnormal   Collection Time: 01/13/20  4:18 AM  Result Value Ref Range   Heparin Unfractionated 0.82 (H) 0.30 - 0.70 IU/mL    Comment: (NOTE) If heparin results are below expected values, and patient dosage has  been confirmed, suggest follow up testing of antithrombin III levels. Performed at [facility], [place] [address] [place], [place] [address]   APTT     Status: Abnormal   Collection Time: 01/13/20  4:18 AM  Result Value Ref Range   aPTT 79 (H) 24 - 36 seconds    Comment:        IF BASELINE aPTT IS ELEVATED, SUGGEST PATIENT RISK ASSESSMENT BE USED TO DETERMINE APPROPRIATE ANTICOAGULANT THERAPY. Performed at [facility], [place] [address] [place], [place] [address]   Basic metabolic panel     Status: Abnormal   Collection Time: 01/13/20  4:18 AM  Result Value Ref Range   Sodium 144 135 - 145 mmol/L   Potassium 3.5 3.5 - 5.1 mmol/L   Chloride 105 98 - 111 mmol/L   CO2 27 22 - 32 mmol/L   Glucose, Bld 110 (H) 70 - 99 mg/dL   BUN 42 (H) 8 - 23 mg/dL  Creatinine, Ser 2.15 (H) 0.61 - 1.24 mg/dL   Calcium 8.8 (L)  8.9 - 10.3 mg/dL   GFR calc non Af Amer 27 (L) >60 mL/min   GFR calc Af Amer 31 (L) >60 mL/min   Anion gap 12 5 - 15    Comment: Performed at [facility] [address] [place], [place] [address]  CK     Status: None   Collection Time: 01/13/20  4:18 AM  Result Value Ref Range   Total CK 70 49 - 397 U/L    Comment: Performed at [facility], [place] [address] [place], [place] [address]  Magnesium     Status: None   Collection Time: 01/13/20  4:18 AM  Result Value Ref Range   Magnesium 1.9 1.7 - 2.4 mg/dL    Comment: Performed at [facility] [address] [place], [place] [address]    ECG   N/A  Telemetry   V-paced rhythm - Personally Reviewed  Radiology    No results found.  Cardiac Studies   N/A  Assessment   1. Principal Problem: 2.   Acute kidney failure ([place]) 3. Active Problems: 4.   Ischemic cardiomyopathy 5.   HTN (hypertension) 6.   Type 2 diabetes mellitus with circulatory disorder ([place]) 7.   Acute lower UTI 8.   Persistent atrial fibrillation ([place]) 9.   Plan   Remains in paced rhythm - off amiodarone. IV heparin - resume Eliquis if renal function will allow on discharge. EF by last echo in 2017 was 50-55%. No further cardiac recommendations at this time. Would recommend follow-up with Dr. Bettina Gavia in [place] after discharge.  [facility] will sign off.   Medication Recommendations:  Continue current Other recommendations (labs, testing, etc):  Transition heparin to Eliquis as renal function allows Follow up as an outpatient:  Dr. Bettina Gavia in [place]  Time Spent Directly with Patient:  I have spent a total of 25 minutes with the patient reviewing hospital notes, telemetry, EKGs, labs and examining the patient as well as establishing an assessment and plan that was discussed personally with the patient.  > 50% of time was spent in direct patient care.  Length of Stay:  LOS: 4 days   Pixie Casino, MD, [facility], [place] Director of the Advanced Lipid Disorders &  Cardiovascular Risk Reduction Clinic Diplomate of the American Board of Clinical Lipidology Attending Cardiologist  Direct Dial: [phone]  Fax: [phone]  Website:  [email or web address] 01/13/2020, 8:57 AM

## 2020-01-13 NOTE — Progress Notes (Signed)
PROGRESS NOTE    Caleb Lyons  ION:[id number] DOB: 1930-01-17 DOA: 01/09/2020 PCP: No primary care provider on file.   Brief Narrative:  HPI On 01/09/2020 by Dr. Jennette Kettle Caleb Lyons is a 86 y.o. male with medical history significant of obesity, OSA, SSS s/p PPM / AICD, chronic diastolic CHF, ICM. Pt presented to [facility] ED on 2/16 with L sided chest discomfort.  Trop neg x3. Was found to have AKF with creat of 8.8, BUN 89.  This significantly worse than his baseline creat of 1.2 just last month. It was suspected that dehydration / pre-renal may be to blame as he was started on lasix 20mg  BID and when that didn't work for peripheral edema it was increased to 40mg  BID and he was on a very fluid restricted diet. As a result he got 6L+ total fluid at [facility] over course of 2/17. After 5L of that though, kidney function didn't show significant improvement, only had 900cc UOP, initally urine appearing but now changed to hematuria. UA showed UTI, UCx growing 100k CFU of a GNR of some sort, started on rocephin. Bicarb of 19, anion gap of 20. WBC 13k. BPs have been running on the low side, especially initially during stay when he came in with systolics in the 24M.  Now 115/58. Looks like creat after the medicine consult went down to 7.7, BUN 91 though, so not sure that's much of an improvement. Patient finally able to get bed and transferred over to [facility] now.  Interim history Admitted with AKI. Nephrology consulted. Creatinine improving. Cardiology consulted for atrial fib. TOC consulted for discharge planning.   Assessment & Plan   Acute kidney injury -Creatinine on admission 7.88 (baseline 1.2) -Unknown etiology, possibly dehydration, ATN with low blood pressures, Lasix use, urinary retention -Patient received approximately 6 L of IV fluid at an outside facility -Continue IV fluid -Monitor intake and output-patient does have Foley catheter in place- noted have some hematuria in foley on 2/18  (?traumatic placement), no output. Foley was replaced, good UOP, clear urine at this time. -Nephrology consulted and appreciated- patient is not a candidate for HD. Has some home hospice services. May need to transition to residential hospice on discharge  -CT renal study did not show any evidence of obstruction -Creatinine down to 2.15 today  UTI -Patient had 100k GNR at outside facility -[facility], [place] -Continue ceftriaxone- for duration of 7 days total through 2/24  Mild rhabdomyolysis -CK 838 on admission, down to 70 -doubt this is contributing to AKI -statin held -Continue IVF  Sick sinus syndrome/Paroxsymal Atrial fibrillation -Patient with pacemaker placement -Eliquis held, place on heparin given AKI -hold metoprolol, amiodarone (per [facility] patient is on amiodarone 200mg  TID) -of note, patient was seen by Dr. Curt Bears 08/05/19, amiodarone was not mentioned -Called Dr. Bettina Gavia who has not seen this patient but was able to tell me that he was started on amiodarone 200mg  TID on 10/09/20.  -supposedly [employer] device was interrogated on 01/08/20, which showed persistent AF -Cardiology consulted and appreciated- recommended discontinuing Amiodarone as there is low likelihood of long-term benefit.   Hypothyroidism -Continue Synthroid  Chronic diastolic heart failure -Currently feeling some SOB -has been receiving IVF for AKI -Continue to monitor I/O, daily weights -diuretics held due to AKI  BPH -Foley in place -Flomax and detrol held  Diabetes mellitus, type II -possibly diet controlled, on no medications at home  Hypokalemia -Resolved with replacement -monitor BMP  Hypomagnesemia  -resolved with replacement  Deconditioning -discussed with wife, patient has been immobile for about 4 months. He has used a walker at home, but more to sit on and get from room to room. She states his legs cannot support him.    Morbid obesity  -BMI 52.77  Goals of  care -patient currently DNR -has been followed by community hospice -discussed with wife, she would like patient to be placed in residential hospice in [place] or [place] consulted -Patient not a candidate for [facility]. -Pending [facility]  DVT Prophylaxis  heparin  Code Status: DNR  Family Communication: None at bedside. Wife via phone.  Disposition Plan: Admitted from outside hospital (likely from home) for AKI. Nephro and cardiology following. Dispo TBD  Consultants Nephrology  Cardiology/EP  Procedures  None  Antibiotics   Anti-infectives (From admission, onward)   Start     Dose/Rate Route Frequency Ordered Stop   01/09/20 0100  cefTRIAXone (ROCEPHIN) 1 g in sodium chloride 0.9 % 100 mL IVPB     1 g 200 mL/hr over 30 Minutes Intravenous Every 24 hours 01/09/20 0047 01/15/20 2359      Subjective:   [name] seen and examined today.  Currently with CPAP in place. Feeling better today. Denies current chest pain, shortness of breath, abdominal pain, N/V/D/C.   Objective:   Vitals:   01/12/20 0935 01/12/20 1742 01/12/20 2143 01/13/20 0434  BP: 134/69 131/75 (!) 105/53 136/70  Pulse: 82 92 79 74  Resp: 18 18 16 18   Temp: 98.4 F (36.9 C) 97.9 F (36.6 C) 98.5 F (36.9 C) 98 F (36.7 C)  TempSrc: Oral Oral Oral Oral  SpO2: 96% 95% 97% 94%  Weight:      Height:        Intake/Output Summary (Last 24 hours) at 01/13/2020 0735 Last data filed at 01/13/2020 0600 Gross per 24 hour  Intake 2634.27 ml  Output 2530 ml  Net 104.27 ml   Filed Weights   01/09/20 0700 01/10/20 0547 01/10/20 2105  Weight: (!) 203.2 kg (!) 181.2 kg (!) 181.4 kg   Exam  General: Well developed, chronically ill appearing, NAD  HEENT: NCAT, mucous membranes moist.   Cardiovascular: S1 S2 auscultated, RRR  Respiratory: Clear to auscultation bilaterally with equal chest rise  Abdomen: Soft, nontender, nondistended, + bowel sounds  Extremities:  warm dry without cyanosis clubbing. Trace LE edema  Neuro: AAOx3, nonfocal  Psych: Normal affect and demeanor with intact judgement and insight  Data Reviewed: I have personally reviewed following labs and imaging studies  CBC: Recent Labs  Lab 01/09/20 0207 01/10/20 0607 01/11/20 0734 01/12/20 0423 01/13/20 0418  WBC 11.8* 8.7 8.1 7.5 7.3  HGB 13.2 12.7* 12.7* 12.3* 12.8*  HCT 40.2 38.2* 37.9* 37.3* 39.0  MCV 98.5 97.2 97.2 97.1 98.0  PLT 195 188 182 200 482   Basic Metabolic Panel: Recent Labs  Lab 01/09/20 0207 01/10/20 0607 01/11/20 0734 01/12/20 0423 01/13/20 0418  NA 139 140 141 145 144  K 3.4* 3.4* 3.0* 3.3* 3.5  CL 105 104 101 105 105  CO2 17* 21* 25 26 27   GLUCOSE 106* 98 100* 108* 110*  BUN 91* 85* 71* 55* 42*  CREATININE 7.88* 5.81* 4.08* 2.93* 2.15*  CALCIUM 8.5* 8.2* 8.4* 8.5* 8.8*  MG  --   --  1.1* 1.6* 1.9  PHOS  --  4.3 3.3 2.7  --    GFR: Estimated Creatinine Clearance: 41.3 mL/min (A) (by C-G formula based on  SCr of 2.15 mg/dL (H)). Liver Function Tests: Recent Labs  Lab 01/09/20 0207 01/10/20 0607 01/10/20 0618 01/11/20 0734 01/12/20 0423  AST 42*  --  41  --   --   ALT 30  --  25  --   --   ALKPHOS 76  --  73  --   --   BILITOT 0.7  --  0.7  --   --   PROT 5.9*  --  5.5*  --   --   ALBUMIN 2.9* 2.5* 2.4* 2.5* 2.4*   No results for input(s): LIPASE, AMYLASE in the last 168 hours. No results for input(s): AMMONIA in the last 168 hours. Coagulation Profile: No results for input(s): INR, PROTIME in the last 168 hours. Cardiac Enzymes: Recent Labs  Lab 01/09/20 0207 01/10/20 0618 01/13/20 0418  CKTOTAL 838* 555* 70   BNP (last 3 results) No results for input(s): PROBNP in the last 8760 hours. HbA1C: No results for input(s): HGBA1C in the last 72 hours. CBG: No results for input(s): GLUCAP in the last 168 hours. Lipid Profile: No results for input(s): CHOL, HDL, LDLCALC, TRIG, CHOLHDL, LDLDIRECT in the last 72 hours. Thyroid  Function Tests: No results for input(s): TSH, T4TOTAL, FREET4, T3FREE, THYROIDAB in the last 72 hours. Anemia Panel: No results for input(s): VITAMINB12, FOLATE, FERRITIN, TIBC, IRON, RETICCTPCT in the last 72 hours. Urine analysis:    Component Value Date/Time   COLORURINE YELLOW 01/09/2020 1501   APPEARANCEUR CLOUDY (A) 01/09/2020 1501   LABSPEC 1.017 01/09/2020 1501   PHURINE 9.0 (H) 01/09/2020 1501   GLUCOSEU 150 (A) 01/09/2020 1501   HGBUR MODERATE (A) 01/09/2020 1501   BILIRUBINUR NEGATIVE 01/09/2020 1501   KETONESUR 5 (A) 01/09/2020 1501   PROTEINUR >=300 (A) 01/09/2020 1501   NITRITE NEGATIVE 01/09/2020 1501   LEUKOCYTESUR SMALL (A) 01/09/2020 1501   Sepsis Labs: @LABRCNTIP (procalcitonin:4,lacticidven:4)  ) Recent Results (from the past 240 hour(s))  Culture, blood (routine x 2)     Status: None (Preliminary result)   Collection Time: 01/09/20  2:07 AM   Specimen: BLOOD  Result Value Ref Range Status   Specimen Description BLOOD RIGHT ANTECUBITAL  Final   Special Requests   Final    BOTTLES DRAWN AEROBIC AND ANAEROBIC Blood Culture adequate volume   Culture   Final    NO GROWTH 3 DAYS Performed at [facility], [place] [address], [place], [place] [address]    Report Status PENDING  Incomplete  Culture, blood (routine x 2)     Status: None (Preliminary result)   Collection Time: 01/09/20  2:21 AM   Specimen: BLOOD LEFT HAND  Result Value Ref Range Status   Specimen Description BLOOD LEFT HAND  Final   Special Requests AEROBIC BOTTLE ONLY Blood Culture adequate volume  Final   Culture   Final    NO GROWTH 3 DAYS Performed at [facility], [place] [address], [place], [place] [address]    Report Status PENDING  Incomplete  SARS CORONAVIRUS 2 (TAT 6-24 HRS) Nasopharyngeal Nasopharyngeal Swab     Status: None   Collection Time: 01/09/20  3:19 AM   Specimen: Nasopharyngeal Swab  Result Value Ref Range Status   SARS Coronavirus 2 NEGATIVE NEGATIVE Final     Comment: (NOTE) SARS-CoV-2 target nucleic acids are NOT DETECTED. The SARS-CoV-2 RNA is generally detectable in upper and lower respiratory specimens during the acute phase of infection. Negative results do not preclude SARS-CoV-2 infection, do not rule  out co-infections with other pathogens, and should not be used as the sole basis for treatment or other patient management decisions. Negative results must be combined with clinical observations, patient history, and epidemiological information. The expected result is Negative. Fact Sheet for Patients: SugarRoll.be Fact Sheet for Healthcare Providers: https://www.woods-mathews.com/ This test is not yet approved or cleared by the [place] FDA and  has been authorized for detection and/or diagnosis of SARS-CoV-2 by FDA under an Emergency Use Authorization (EUA). This EUA will remain  in effect (meaning this test can be used) for the duration of the COVID-19 declaration under Section 56 4(b)(1) of the Act, 21 U.S.C. section 360bbb-3(b)(1), unless the authorization is terminated or revoked sooner. Performed at [facility], [place] [address] [place], [place] [address]       Radiology Studies: No results found.   Scheduled Meds: . Chlorhexidine Gluconate Cloth  6 each Topical Daily  . cinacalcet  30 mg Oral Q breakfast  . levothyroxine  200 mcg Oral Q0600   Continuous Infusions: . sodium chloride 50 mL/hr at 01/12/20 1302  . cefTRIAXone (ROCEPHIN)  IV 1 g (01/13/20 0022)  . heparin 1,200 Units/hr (01/13/20 0138)     LOS: 4 days   Time Spent in minutes   30 minutes  Elani Delph D.O. on 01/13/2020 at 7:35 AM  Between 7am to 7pm - Please see pager noted on [email or web address]  After 7pm go to [email or web address]  And look for the night coverage person covering for me after hours  [facility] Office  [phone]

## 2020-01-13 NOTE — TOC Progression Note (Signed)
Transition of Care [facility]) - Progression Note    Patient Details  Name: Caleb Lyons MRN: [id number] Date of Birth: June 29, 1930  Transition of Care [facility]) CM/SW Contact  Bartholomew Crews, RN Phone Number: [phone] 01/13/2020, 2:56 PM  Clinical Narrative:    Advised by nursing that spouse was in patient's room, however, spouse left before NCM could get to room.   Telephone call to [facility] who advised that patient was discharged from their services on 01/06/2020.   Spoke with patient at the bedside to discuss transition home with hospice versus facility. Patient states that he just wants to get out of the hospital asap and that his wife cannot physically care for him.   Spoke with palliative who had spoken with spouse who wants SNF for long term care with hospice services - first choice is [facility].   Spoke with liaison at [facility] who had also spoken with the spouse, and is aware of what spouse needs. [facility] is contracted with both of the preferred facilities, and will be able to follow patient if he is able to get a bed at either facility.   TOC team following for transition needs.    Expected Discharge Plan: [place] Barriers to Discharge: Continued Medical Work up  Expected Discharge Plan and Services Expected Discharge Plan: [place]                                               Social Determinants of Health (SDOH) Interventions    Readmission Risk Interventions No flowsheet data found.

## 2020-01-13 NOTE — Progress Notes (Signed)
ANTICOAGULATION CONSULT NOTE - Follow Up Consult  Pharmacy Consult for heparin Indication: atrial fibrillation  Labs: Recent Labs    01/11/20 0433 01/11/20 0734 01/11/20 0734 01/12/20 0423 01/13/20 0418  HGB  --  12.7*   < > 12.3* 12.8*  HCT  --  37.9*  --  37.3* 39.0  PLT  --  182  --  200 208  APTT 71*  --   --  81* 79*  HEPARINUNFRC 1.42*  --   --  1.09* 0.82*  CREATININE  --  4.08*  --  2.93* 2.15*  CKTOTAL  --   --   --   --  70   < > = values in this interval not displayed.    Assessment: 86yo male with history of AF on apixaban prior to admit. Patient currently being treated with IV heparin.   Aptt this afternoon is at goal (79 sec) at heparin IV 1200 units/hr. Hgb 12.8. Platelets WNL. No infusion issues or bleeding per the RN.   Heparin level 0.82  d/t recent apixaban use, will continue to make dose adjustments based on aptt until the levels correlate.   Goal of Therapy:  aPTT 66-102 seconds   Plan:  Continue heparin at 1200 units/hr Daily heparin level, cbc, aptt Monitor for s/sx of bleeding  Yosiah Jasmin A. Levada Dy, PharmD, BCPS, FNKF Clinical Pharmacist [place] Please utilize Amion for appropriate phone number to reach the unit pharmacist ([place])

## 2020-01-13 NOTE — Progress Notes (Signed)
Kemp to get ICD turned off.

## 2020-01-14 ENCOUNTER — Inpatient Hospital Stay (HOSPITAL_COMMUNITY): Payer: Medicare Other

## 2020-01-14 DIAGNOSIS — Z515 Encounter for palliative care: Secondary | ICD-10-CM

## 2020-01-14 DIAGNOSIS — Z66 Do not resuscitate: Secondary | ICD-10-CM

## 2020-01-14 DIAGNOSIS — R5381 Other malaise: Secondary | ICD-10-CM

## 2020-01-14 LAB — BASIC METABOLIC PANEL
Anion gap: 13 (ref 5–15)
Anion gap: 12 (ref 5–15)
BUN: 31 mg/dL — ABNORMAL HIGH (ref 8–23)
BUN: 29 mg/dL — ABNORMAL HIGH (ref 8–23)
CO2: 28 mmol/L (ref 22–32)
CO2: 28 mmol/L (ref 22–32)
Calcium: 9.1 mg/dL (ref 8.9–10.3)
Calcium: 9.5 mg/dL (ref 8.9–10.3)
Chloride: 107 mmol/L (ref 98–111)
Chloride: 105 mmol/L (ref 98–111)
Creatinine, Ser: 1.84 mg/dL — ABNORMAL HIGH (ref 0.61–1.24)
Creatinine, Ser: 1.81 mg/dL — ABNORMAL HIGH (ref 0.61–1.24)
GFR calc Af Amer: 37 mL/min — ABNORMAL LOW (ref >60)
GFR calc Af Amer: 38 mL/min — ABNORMAL LOW (ref >60)
GFR calc non Af Amer: 32 mL/min — ABNORMAL LOW (ref >60)
GFR calc non Af Amer: 33 mL/min — ABNORMAL LOW (ref >60)
Glucose, Bld: 106 mg/dL — ABNORMAL HIGH (ref 70–99)
Glucose, Bld: 126 mg/dL — ABNORMAL HIGH (ref 70–99)
Potassium: 4 mmol/L (ref 3.5–5.1)
Potassium: 3.7 mmol/L (ref 3.5–5.1)
Sodium: 148 mmol/L — ABNORMAL HIGH (ref 135–145)
Sodium: 145 mmol/L (ref 135–145)

## 2020-01-14 LAB — CBC
HCT: 39.3 % (ref 39.0–52.0)
Hemoglobin: 12.7 g/dL — ABNORMAL LOW (ref 13.0–17.0)
MCH: 32.7 pg (ref 26.0–34.0)
MCHC: 32.3 g/dL (ref 30.0–36.0)
MCV: 101.3 fL — ABNORMAL HIGH (ref 80.0–100.0)
Platelets: 204 10*3/uL (ref 150–400)
RBC: 3.88 MIL/uL — ABNORMAL LOW (ref 4.22–5.81)
RDW: 18.1 % — ABNORMAL HIGH (ref 11.5–15.5)
WBC: 7.5 10*3/uL (ref 4.0–10.5)
nRBC: 0 % (ref 0.0–0.2)

## 2020-01-14 LAB — CULTURE, BLOOD (ROUTINE X 2)
Culture: NO GROWTH
Culture: NO GROWTH
Special Requests: ADEQUATE
Special Requests: ADEQUATE

## 2020-01-14 LAB — UREA NITROGEN, URINE: Urea Nitrogen, Ur: 108 mg/dL

## 2020-01-14 LAB — APTT: aPTT: 64 seconds — ABNORMAL HIGH (ref 24–36)

## 2020-01-14 LAB — HEPARIN LEVEL (UNFRACTIONATED): Heparin Unfractionated: 0.63 IU/mL (ref 0.30–0.70)

## 2020-01-14 LAB — MAGNESIUM: Magnesium: 1.6 mg/dL — ABNORMAL LOW (ref 1.7–2.4)

## 2020-01-14 MED ORDER — FUROSEMIDE 10 MG/ML IJ SOLN
40.0000 mg | Freq: Once | INTRAMUSCULAR | Status: AC
  Administered 2020-01-14: 40 mg via INTRAVENOUS
  Filled 2020-01-14: qty 4

## 2020-01-14 MED ORDER — APIXABAN 2.5 MG PO TABS
2.5000 mg | ORAL_TABLET | Freq: Two times a day (BID) | ORAL | Status: DC
  Administered 2020-01-14 – 2020-01-17 (×7): 2.5 mg via ORAL
  Filled 2020-01-14 (×7): qty 1

## 2020-01-14 MED ORDER — MAGNESIUM SULFATE 2 GM/50ML IV SOLN
2.0000 g | Freq: Once | INTRAVENOUS | Status: AC
  Administered 2020-01-14: 2 g via INTRAVENOUS
  Filled 2020-01-14: qty 50

## 2020-01-14 NOTE — Progress Notes (Signed)
ANTICOAGULATION CONSULT NOTE - Follow Up Consult  Pharmacy Consult for heparin Indication: atrial fibrillation  Labs: Recent Labs    01/12/20 0423 01/12/20 0423 01/13/20 0418 01/14/20 0514  HGB 12.3*   < > 12.8* 12.7*  HCT 37.3*  --  39.0 39.3  PLT 200  --  208 204  APTT 81*  --  79* 64*  HEPARINUNFRC 1.09*  --  0.82* 0.63  CREATININE 2.93*  --  2.15* 1.84*  CKTOTAL  --   --  70  --    < > = values in this interval not displayed.    Assessment: 86yo male with history of AF on apixaban prior to admit. Patient currently being treated with IV heparin.   Aptt this afternoon is below goal (64 sec) at heparin IV 1200 units/hr. Hgb 12.7. Platelets WNL. No infusion issues or bleeding per the RN.   Heparin level 0.63  d/t recent apixaban use, will continue to make dose adjustments based on aptt until the levels correlate. Cardiology recommending restart of apixaban once renal function improves. Scr decreased to 1.84. If apixaban restarted would dose at 2.5mg  BID due to age >39, and Scr >1.5    Goal of Therapy:  aPTT 66-102 seconds   Plan:  Increase heparin to 1300 units/hr Daily heparin level, cbc, aptt F/u for transition to apixaban Monitor for s/sx of bleeding  Brittnay Pigman A. Levada Dy, PharmD, BCPS, FNKF Clinical Pharmacist [place] Please utilize Amion for appropriate phone number to reach the unit pharmacist ([place])

## 2020-01-14 NOTE — Plan of Care (Signed)
  Problem: Clinical Measurements: Goal: Ability to maintain clinical measurements within normal limits will improve Outcome: Progressing   

## 2020-01-14 NOTE — Progress Notes (Signed)
D/C foley cath per Dr. Ree Kida orders.

## 2020-01-14 NOTE — TOC Progression Note (Signed)
Transition of Care [facility]) - Progression Note    Patient Details  Name: Caleb Lyons MRN: [id number] Date of Birth: 07-Nov-1930  Transition [facility]) CM/SW Contact  Bartholomew Crews, RN Phone Number: [phone] 01/14/2020, 11:19 AM  Clinical Narrative:    Spoke with spouse on the phone to discuss search for long term beds.   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   Received messaged from [place] that patient is not eligible for long term bed at [facility] and d/t to pandemic are not accepting new patients.   Discussed alternatives of trying home care at home with different agency and more hours and hospice engagement. Wife very concerned about not being able to provide care to patient at home.   TOC team following for transition needs.    Expected Discharge Plan: [place] Barriers to Discharge: Continued Medical Work up  Expected Discharge Plan and Services Expected Discharge Plan: [place]                                               Social Determinants of Health (SDOH) Interventions    Readmission Risk Interventions No flowsheet data found.

## 2020-01-14 NOTE — Progress Notes (Signed)
Patient ID: Caleb Lyons, male   DOB: 03-Dec-1929, 86 y.o.   MRN: [id number]  This NP visited patient at the bedside as a follow up to  yesterday's GOCs meeting and to meet with spouse as scheduled for continued conversation regarding current medical situation; diagnosis, prognosis, goals of care, end-of-life wishes, disposition and options.  Patient is alert and oriented and able to participate in today's conversation. Patient and wife have had detailed discussion with transition of care team and plan is now for patient to return home with hospice services with   Additional out-of-pocket caregivers.   Plan of care -DNR/DNI -Focus of care is comfort, quality and dignity -Transition home with hospice and avoid rehospitalization -Continue current oral medications as long as tolerated and hospice will make adjustments along the disease trajectory -Medications to enhance comfort specific to pain and shortness of breath -MOST form completed to reflect comfort, original in chart and family received a copy   Discussed with patient the importance of continued conversation with each other and their  medical providers regarding overall plan of care and treatment options,  ensuring decisions are within the context of the patients values and GOCs.  Questions and concerns addressed   Discussed with [facility]   Total time spent on the unit was 40 minutes  Greater than 50% of the time was spent in counseling and coordination of care  Wadie Lessen NP  Palliative Medicine Team Team Phone # [phone] Pager [phone]

## 2020-01-14 NOTE — TOC Progression Note (Signed)
Transition of Care [facility]) - Progression Note    Patient Details  Name: Caleb Lyons MRN: [id number] Date of Birth: Jun 27, 1930  Transition of Care [facility]) CM/SW Contact  Bartholomew Crews, RN Phone Number: [phone] 01/14/2020, 1:50 PM  Clinical Narrative:    Spoke with patient and spouse at the bedside. Reviewed search for long term bed and multiple barriers. Revisited hospice at home with home care supplement. Spouse agreeable to seeking home care assistance from other agencies. Spoke with liaison for [employer] who will provide list of home care agencies. [facility] to deliver bariatric bed with gel overlay and oxygen. NCM requested hoyer lift. NCM spoke with [facility] liaison about PCS services for private pay - [facility] to reach out to spouse to discuss their services. MOST form completed by palliative with spouse.  TOC following for transition needs.    Expected Discharge Plan: [place] Barriers to Discharge: Continued Medical Work up  Expected Discharge Plan and Services Expected Discharge Plan: [place]                                               Social Determinants of Health (SDOH) Interventions    Readmission Risk Interventions No flowsheet data found.

## 2020-01-14 NOTE — Progress Notes (Addendum)
PROGRESS NOTE    Caleb Lyons  VVO:[id number] DOB: 12/16/1929 DOA: 01/09/2020 PCP: No primary care provider on file.   Brief Narrative:  HPI On 01/09/2020 by Dr. Jennette Kettle Caleb Lyons is a 86 y.o. male with medical history significant of obesity, OSA, SSS s/p PPM / AICD, chronic diastolic CHF, ICM. Pt presented to [facility] ED on 2/16 with L sided chest discomfort.  Trop neg x3. Was found to have AKF with creat of 8.8, BUN 89.  This significantly worse than his baseline creat of 1.2 just last month. It was suspected that dehydration / pre-renal may be to blame as he was started on lasix 20mg  BID and when that didn't work for peripheral edema it was increased to 40mg  BID and he was on a very fluid restricted diet. As a result he got 6L+ total fluid at [facility] over course of 2/17. After 5L of that though, kidney function didn't show significant improvement, only had 900cc UOP, initally urine appearing but now changed to hematuria. UA showed UTI, UCx growing 100k CFU of a GNR of some sort, started on rocephin. Bicarb of 19, anion gap of 20. WBC 13k. BPs have been running on the low side, especially initially during stay when he came in with systolics in the 26R.  Now 115/58. Looks like creat after the medicine consult went down to 7.7, BUN 91 though, so not sure that's much of an improvement. Patient finally able to get bed and transferred over to [facility] now.  Interim history Admitted with AKI. Nephrology consulted. Creatinine improving. Cardiology consulted for atrial fib. TOC consulted for discharge planning.   Assessment & Plan   Acute kidney injury -Creatinine on admission 7.88 (baseline 1.2) -Unknown etiology, possibly dehydration, ATN with low blood pressures, Lasix use, urinary retention -Monitor intake and output-patient does have Foley catheter in place- noted have some hematuria in foley on 2/18 (?traumatic placement), no output. Foley was replaced, good UOP, clear urine at this  time. -Nephrology consulted and appreciated- patient is not a candidate for HD. Has some home hospice services. May need to transition to residential hospice on discharge  -CT renal study did not show any evidence of obstruction -Creatinine down to 1.84 today -Will discontinue IVF today  Mild hyponatremia -will discontinue IVF -Sodium 148, will repeat later today -will give dose of lasix   UTI -Patient had 100k GNR at outside facility -[facility], [place] -Continue ceftriaxone- for duration of 7 days total through 2/24  Mild rhabdomyolysis -CK 838 on admission, down to 70 -doubt this is contributing to AKI -statin held -was placed on IVF  Sick sinus syndrome/Paroxsymal Atrial fibrillation -Patient with pacemaker placement -Eliquis was held, placed on heparin given AKI -hold metoprolol, amiodarone (per [facility] patient is on amiodarone 200mg  TID) -of note, patient was seen by Dr. Curt Bears 08/05/19, amiodarone was not mentioned -Called Dr. Bettina Gavia who has not seen this patient but was able to tell me that he was started on amiodarone 200mg  TID on 10/09/20.  -supposedly [employer] device was interrogated on 01/08/20, which showed persistent AF -Cardiology consulted and appreciated- recommended discontinuing Amiodarone as there is low likelihood of long-term benefit.  -AKI improving, will discontinue IV heparin and restart Eliquis at a lower dose 2.5mg  BID  Hypothyroidism -Continue Synthroid  Acute on Chronic diastolic heart failure -has been receiving IVF for AKI -Continue to monitor I/O, daily weights -diuretics held due to AKI -Patient with wheezing and SOB today -discontinued IVF and will give dose  of IV lasix and monitor  -Obtain CXR  BPH -Foley in place -Flomax and detrol held  Diabetes mellitus, type II -possibly diet controlled, on no medications at home  Hypokalemia -Resolved with replacement -monitor BMP  Hypomagnesemia  -will replace and monitor    Deconditioning -discussed with wife, patient has been immobile for about 4 months. He has used a walker at home, but more to sit on and get from room to room. She states his legs cannot support him.    Morbid obesity  -BMI 52.77  Goals of care -patient currently DNR -has been followed by community hospice -discussed with wife, she would like patient to be placed in residential hospice in [place] or [place]. She does not feel she is able to care for him at home. -TOC consulted -Patient not a candidate for [facility] [place] [facility] [place] -Palliative care consulted -Meeting planned for later today -Patient also wanted AICD deactivated  DVT Prophylaxis  Heparin --> Eliquis  Code Status: DNR  Family Communication: None at bedside.   Disposition Plan: Admitted from outside hospital (likely from home) for AKI. Nephro and cardiology following. Dispo TBD  Consultants Nephrology  Cardiology/EP  Procedures  None  Antibiotics   Anti-infectives (From admission, onward)   Start     Dose/Rate Route Frequency Ordered Stop   01/09/20 0100  cefTRIAXone (ROCEPHIN) 1 g in sodium chloride 0.9 % 100 mL IVPB     1 g 200 mL/hr over 30 Minutes Intravenous Every 24 hours 01/09/20 0047 01/15/20 2359      Subjective:   Caleb Lyons seen and examined today.  Feels short of breath this morning. Would like to get out of the hospital today. Denies current chest pain, abdominal pain, N/V/D/C.   Objective:   Vitals:   01/13/20 2144 01/13/20 2148 01/14/20 0500 01/14/20 0800  BP:  (!) 144/76 140/73   Pulse: 84 85 88 90  Resp: 16   18  Temp: 98.4 F (36.9 C)  98 F (36.7 C) 98.6 F (37 C)  TempSrc: Oral  Oral Oral  SpO2: 96%  98% 91%  Weight:      Height:        Intake/Output Summary (Last 24 hours) at 01/14/2020 0904 Last data filed at 01/14/2020 0600 Gross per 24 hour  Intake 1776.12 ml  Output 1450 ml  Net 326.12 ml   Filed Weights   01/09/20 0700 01/10/20 0547  01/10/20 2105  Weight: (!) 203.2 kg (!) 181.2 kg (!) 181.4 kg   Exam  General: Well developed, chronically ill appearing, NAD  HEENT: NCAT, mucous membranes moist.   Cardiovascular: S1 S2 auscultated,RRR  Respiratory: Diffuse exp wheezing  Abdomen: Soft, obese, nontender, nondistended, + bowel sounds  Extremities: warm dry without cyanosis clubbing. LE edema B/L   Neuro: AAOx3, nonfocal  Psych: Appropriate mood and affect  Data Reviewed: I have personally reviewed following labs and imaging studies  CBC: Recent Labs  Lab 01/10/20 0607 01/11/20 0734 01/12/20 0423 01/13/20 0418 01/14/20 0514  WBC 8.7 8.1 7.5 7.3 7.5  HGB 12.7* 12.7* 12.3* 12.8* 12.7*  HCT 38.2* 37.9* 37.3* 39.0 39.3  MCV 97.2 97.2 97.1 98.0 101.3*  PLT 188 182 200 208 099   Basic Metabolic Panel: Recent Labs  Lab 01/10/20 0607 01/11/20 0734 01/12/20 0423 01/13/20 0418 01/14/20 0514  NA 140 141 145 144 148*  K 3.4* 3.0* 3.3* 3.5 4.0  CL 104 101 105 105 107  CO2 21* 25 26 27  28  GLUCOSE 98 100* 108* 110* 106*  BUN 85* 71* 55* 42* 31*  CREATININE 5.81* 4.08* 2.93* 2.15* 1.84*  CALCIUM 8.2* 8.4* 8.5* 8.8* 9.1  MG  --  1.1* 1.6* 1.9 1.6*  PHOS 4.3 3.3 2.7  --   --    GFR: Estimated Creatinine Clearance: 48.2 mL/min (A) (by C-G formula based on SCr of 1.84 mg/dL (H)). Liver Function Tests: Recent Labs  Lab 01/09/20 0207 01/10/20 0607 01/10/20 0618 01/11/20 0734 01/12/20 0423  AST 42*  --  41  --   --   ALT 30  --  25  --   --   ALKPHOS 76  --  73  --   --   BILITOT 0.7  --  0.7  --   --   PROT 5.9*  --  5.5*  --   --   ALBUMIN 2.9* 2.5* 2.4* 2.5* 2.4*   No results for input(s): LIPASE, AMYLASE in the last 168 hours. No results for input(s): AMMONIA in the last 168 hours. Coagulation Profile: No results for input(s): INR, PROTIME in the last 168 hours. Cardiac Enzymes: Recent Labs  Lab 01/09/20 0207 01/10/20 0618 01/13/20 0418  CKTOTAL 838* 555* 70   BNP (last 3 results) No  results for input(s): PROBNP in the last 8760 hours. HbA1C: No results for input(s): HGBA1C in the last 72 hours. CBG: No results for input(s): GLUCAP in the last 168 hours. Lipid Profile: No results for input(s): CHOL, HDL, LDLCALC, TRIG, CHOLHDL, LDLDIRECT in the last 72 hours. Thyroid Function Tests: No results for input(s): TSH, T4TOTAL, FREET4, T3FREE, THYROIDAB in the last 72 hours. Anemia Panel: No results for input(s): VITAMINB12, FOLATE, FERRITIN, TIBC, IRON, RETICCTPCT in the last 72 hours. Urine analysis:    Component Value Date/Time   COLORURINE YELLOW 01/09/2020 1501   APPEARANCEUR CLOUDY (A) 01/09/2020 1501   LABSPEC 1.017 01/09/2020 1501   PHURINE 9.0 (H) 01/09/2020 1501   GLUCOSEU 150 (A) 01/09/2020 1501   HGBUR MODERATE (A) 01/09/2020 1501   BILIRUBINUR NEGATIVE 01/09/2020 1501   KETONESUR 5 (A) 01/09/2020 1501   PROTEINUR >=300 (A) 01/09/2020 1501   NITRITE NEGATIVE 01/09/2020 1501   LEUKOCYTESUR SMALL (A) 01/09/2020 1501   Sepsis Labs: @LABRCNTIP (procalcitonin:4,lacticidven:4)  ) Recent Results (from the past 240 hour(s))  Culture, blood (routine x 2)     Status: None   Collection Time: 01/09/20  2:07 AM   Specimen: BLOOD  Result Value Ref Range Status   Specimen Description BLOOD RIGHT ANTECUBITAL  Final   Special Requests   Final    BOTTLES DRAWN AEROBIC AND ANAEROBIC Blood Culture adequate volume   Culture   Final    NO GROWTH 5 DAYS Performed at [facility], [place] [address] [place], [place] [address]    Report Status 01/14/2020 FINAL  Final  Culture, blood (routine x 2)     Status: None   Collection Time: 01/09/20  2:21 AM   Specimen: BLOOD LEFT HAND  Result Value Ref Range Status   Specimen Description BLOOD LEFT HAND  Final   Special Requests AEROBIC BOTTLE ONLY Blood Culture adequate volume  Final   Culture   Final    NO GROWTH 5 DAYS Performed at [facility], [place] [address] [place], [place] [address]    Report Status  01/14/2020 FINAL  Final  SARS CORONAVIRUS 2 (TAT 6-24 HRS) Nasopharyngeal Nasopharyngeal Swab     Status: None   Collection Time: 01/09/20  3:19 AM  Specimen: Nasopharyngeal Swab  Result Value Ref Range Status   SARS Coronavirus 2 NEGATIVE NEGATIVE Final    Comment: (NOTE) SARS-CoV-2 target nucleic acids are NOT DETECTED. The SARS-CoV-2 RNA is generally detectable in upper and lower respiratory specimens during the acute phase of infection. Negative results do not preclude SARS-CoV-2 infection, do not rule out co-infections with other pathogens, and should not be used as the sole basis for treatment or other patient management decisions. Negative results must be combined with clinical observations, patient history, and epidemiological information. The expected result is Negative. Fact Sheet for Patients: SugarRoll.be Fact Sheet for Healthcare Providers: https://www.woods-mathews.com/ This test is not yet approved or cleared by the [place] FDA and  has been authorized for detection and/or diagnosis of SARS-CoV-2 by FDA under an Emergency Use Authorization (EUA). This EUA will remain  in effect (meaning this test can be used) for the duration of the COVID-19 declaration under Section 56 4(b)(1) of the Act, 21 U.S.C. section 360bbb-3(b)(1), unless the authorization is terminated or revoked sooner. Performed at [facility], [place] [address] [place], [place] [address]       Radiology Studies: No results found.   Scheduled Meds: . apixaban  2.5 mg Oral BID  . Chlorhexidine Gluconate Cloth  6 each Topical Daily  . cinacalcet  30 mg Oral Q breakfast  . furosemide  40 mg Intravenous Once  . levothyroxine  200 mcg Oral Q0600   Continuous Infusions: . cefTRIAXone (ROCEPHIN)  IV 1 g (01/14/20 0038)     LOS: 5 days   Time Spent in minutes   45 minutes  Arriel Victor D.O. on 01/14/2020 at 9:04 AM  Between 7am to 7pm -  Please see pager noted on [email or web address]  After 7pm go to [email or web address]  And look for the night coverage person covering for me after hours  [facility] Office  [phone]

## 2020-01-15 DIAGNOSIS — N184 Chronic kidney disease, stage 4 (severe): Secondary | ICD-10-CM

## 2020-01-15 DIAGNOSIS — N179 Acute kidney failure, unspecified: Secondary | ICD-10-CM

## 2020-01-15 LAB — CBC
HCT: 38.8 % — ABNORMAL LOW (ref 39.0–52.0)
Hemoglobin: 12.7 g/dL — ABNORMAL LOW (ref 13.0–17.0)
MCH: 32.3 pg (ref 26.0–34.0)
MCHC: 32.7 g/dL (ref 30.0–36.0)
MCV: 98.7 fL (ref 80.0–100.0)
Platelets: 188 10*3/uL (ref 150–400)
RBC: 3.93 MIL/uL — ABNORMAL LOW (ref 4.22–5.81)
RDW: 17.6 % — ABNORMAL HIGH (ref 11.5–15.5)
WBC: 7.9 10*3/uL (ref 4.0–10.5)
nRBC: 0 % (ref 0.0–0.2)

## 2020-01-15 LAB — BASIC METABOLIC PANEL
Anion gap: 13 (ref 5–15)
BUN: 24 mg/dL — ABNORMAL HIGH (ref 8–23)
CO2: 30 mmol/L (ref 22–32)
Calcium: 9.8 mg/dL (ref 8.9–10.3)
Chloride: 104 mmol/L (ref 98–111)
Creatinine, Ser: 1.64 mg/dL — ABNORMAL HIGH (ref 0.61–1.24)
GFR calc Af Amer: 43 mL/min — ABNORMAL LOW (ref >60)
GFR calc non Af Amer: 37 mL/min — ABNORMAL LOW (ref >60)
Glucose, Bld: 125 mg/dL — ABNORMAL HIGH (ref 70–99)
Potassium: 3.7 mmol/L (ref 3.5–5.1)
Sodium: 147 mmol/L — ABNORMAL HIGH (ref 135–145)

## 2020-01-15 LAB — HEPARIN LEVEL (UNFRACTIONATED): Heparin Unfractionated: 1.2 IU/mL — ABNORMAL HIGH (ref 0.30–0.70)

## 2020-01-15 LAB — APTT: aPTT: 32 seconds (ref 24–36)

## 2020-01-15 MED ORDER — TRAZODONE HCL 100 MG PO TABS: 100.0000 mg | ORAL_TABLET | Freq: Every evening | ORAL | Status: DC | PRN

## 2020-01-15 NOTE — Progress Notes (Signed)
PROGRESS NOTE    Caleb Lyons  HYW:[id number] DOB: 1930-01-02 DOA: 01/09/2020 PCP: No primary care provider on file.     Brief Narrative:  Patient is an 86 year old male with history of morbid obesity, obstructive sleep apnea, sick sinus syndrome, and chronic diastolic heart failure.  He presented to [facility] on 2/16 with left-sided chest discomfort and was found to have acute kidney injury with creatinine of 8.8, baseline of 1.2.  Dehydration was suspected due to outpatient diuretic dosage increase.  After rehydration, he did not improve with minimal urine output but now with blood.  He was started on Rocephin for UTI.  He was transferred to [facility].  The nephrology team was consulted, cardiology for atrial fibrillation.  Discharge has been delayed due to question of placement.   New events last 24 hours / Subjective: No changes, feels fine overall.  Awaiting placement.  Assessment & Plan:   Principal Problem:   Acute renal failure superimposed on stage 4 chronic kidney disease ([place])  Appreciate nephrology  Holding on dialysis, plans to transition to home versus residential hospice  Fluids have been stopped  He refused labs this morning which I am okay with  Active Problems:   Ischemic cardiomyopathy    HTN (hypertension)  Holding meds    Type 2 diabetes mellitus with circulatory disorder ([place])  Holding meds    Acute lower UTI  Rocephin completed    Persistent atrial fibrillation (HCC)  Eliquis 2.5 mg twice daily    Hypothyroidism  Continue Synthroid 200 mcg daily    Morbid obesity   Debility    In agreement with assessment of the pressure ulcer as below:  Pressure Injury 01/09/20 (Active)  01/09/20 0100  Location:   Location Orientation:   Staging:   Wound Description (Comments):   Present on Admission:     DVT prophylaxis: Eliquis Code Status: DNR Family Communication: Self Coming From: Home Disposition Plan: Home with  hospice Barriers to Discharge: Hospice approval  Consultants:   Cardiology  Nephrology  Antimicrobials:  Anti-infectives (From admission, onward)   Start     Dose/Rate Route Frequency Ordered Stop   01/09/20 0100  cefTRIAXone (ROCEPHIN) 1 g in sodium chloride 0.9 % 100 mL IVPB     1 g 200 mL/hr over 30 Minutes Intravenous Every 24 hours 01/09/20 0047 01/15/20 0127        Objective: Vitals:   01/14/20 1648 01/14/20 2042 01/15/20 0451 01/15/20 0850  BP: (!) 149/97 (!) 155/85 140/84 (!) 149/73  Pulse: 97 77 83 81  Resp: 18 16 16    Temp: 97.7 F (36.5 C) 98.4 F (36.9 C) (!) 97.3 F (36.3 C) 98.4 F (36.9 C)  TempSrc: Oral Oral Oral Oral  SpO2: 95% 92% 93% 96%  Weight:  (!) 182 kg    Height:        Intake/Output Summary (Last 24 hours) at 01/15/2020 1101 Last data filed at 01/15/2020 0900 Gross per 24 hour  Intake 580 ml  Output 1800 ml  Net -1220 ml   Filed Weights   01/10/20 0547 01/10/20 2105 01/14/20 2042  Weight: (!) 181.2 kg (!) 181.4 kg (!) 182 kg    Examination: General exam: Appears calm and comfortable  Respiratory system: Clear to auscultation. Respiratory effort normal. No respiratory distress. No conversational dyspnea.  Cardiovascular system: S1 & S2 heard, RRR.  Gastrointestinal system: Large panniculus.  Abdomen is nondistended, soft and nontender. Normal bowel sounds heard. Central nervous system: No focal neurological  deficits. Speech clear.  Psychiatry: Mood & affect appropriate.   Data Reviewed: I have personally reviewed following labs and imaging studies  CBC: Recent Labs  Lab 01/11/20 0734 01/12/20 0423 01/13/20 0418 01/14/20 0514 01/15/20 0535  WBC 8.1 7.5 7.3 7.5 7.9  HGB 12.7* 12.3* 12.8* 12.7* 12.7*  HCT 37.9* 37.3* 39.0 39.3 38.8*  MCV 97.2 97.1 98.0 101.3* 98.7  PLT 182 200 208 204 194   Basic Metabolic Panel: Recent Labs  Lab 01/10/20 0607 01/10/20 0607 01/11/20 0734 01/12/20 0423 01/13/20 0418 01/14/20 0514  01/14/20 1346  NA 140   < > 141 145 144 148* 145  K 3.4*   < > 3.0* 3.3* 3.5 4.0 3.7  CL 104   < > 101 105 105 107 105  CO2 21*   < > 25 26 27 28 28   GLUCOSE 98   < > 100* 108* 110* 106* 126*  BUN 85*   < > 71* 55* 42* 31* 29*  CREATININE 5.81*   < > 4.08* 2.93* 2.15* 1.84* 1.81*  CALCIUM 8.2*   < > 8.4* 8.5* 8.8* 9.1 9.5  MG  --   --  1.1* 1.6* 1.9 1.6*  --   PHOS 4.3  --  3.3 2.7  --   --   --    < > = values in this interval not displayed.   GFR: Estimated Creatinine Clearance: 49.1 mL/min (A) (by C-G formula based on SCr of 1.81 mg/dL (H)). Liver Function Tests: Recent Labs  Lab 01/09/20 0207 01/10/20 0607 01/10/20 0618 01/11/20 0734 01/12/20 0423  AST 42*  --  41  --   --   ALT 30  --  25  --   --   ALKPHOS 76  --  73  --   --   BILITOT 0.7  --  0.7  --   --   PROT 5.9*  --  5.5*  --   --   ALBUMIN 2.9* 2.5* 2.4* 2.5* 2.4*  Cardiac Enzymes: Recent Labs  Lab 01/09/20 0207 01/10/20 0618 01/13/20 0418  CKTOTAL 838* 555* 70    Sepsis Labs: Recent Labs  Lab 01/09/20 0207 01/09/20 0551  LATICACIDVEN 1.6 1.4    Recent Results (from the past 240 hour(s))  Culture, blood (routine x 2)     Status: None   Collection Time: 01/09/20  2:07 AM   Specimen: BLOOD  Result Value Ref Range Status   Specimen Description BLOOD RIGHT ANTECUBITAL  Final   Special Requests   Final    BOTTLES DRAWN AEROBIC AND ANAEROBIC Blood Culture adequate volume   Culture   Final    NO GROWTH 5 DAYS Performed at [facility], [address], [place], [place] [address]    Report Status 01/14/2020 FINAL  Final  Culture, blood (routine x 2)     Status: None   Collection Time: 01/09/20  2:21 AM   Specimen: BLOOD LEFT HAND  Result Value Ref Range Status   Specimen Description BLOOD LEFT HAND  Final   Special Requests AEROBIC BOTTLE ONLY Blood Culture adequate volume  Final   Culture   Final    NO GROWTH 5 DAYS Performed at [facility], [place] [address], [place], [place]  [address]    Report Status 01/14/2020 FINAL  Final  SARS CORONAVIRUS 2 (TAT 6-24 HRS) Nasopharyngeal Nasopharyngeal Swab     Status: None   Collection Time: 01/09/20  3:19 AM   Specimen: Nasopharyngeal  Swab  Result Value Ref Range Status   SARS Coronavirus 2 NEGATIVE NEGATIVE Final    Comment: (NOTE) SARS-CoV-2 target nucleic acids are NOT DETECTED. The SARS-CoV-2 RNA is generally detectable in upper and lower respiratory specimens during the acute phase of infection. Negative results do not preclude SARS-CoV-2 infection, do not rule out co-infections with other pathogens, and should not be used as the sole basis for treatment or other patient management decisions. Negative results must be combined with clinical observations, patient history, and epidemiological information. The expected result is Negative. Fact Sheet for Patients: SugarRoll.be Fact Sheet for Healthcare Providers: https://www.woods-mathews.com/ This test is not yet approved or cleared by the [place] FDA and  has been authorized for detection and/or diagnosis of SARS-CoV-2 by FDA under an Emergency Use Authorization (EUA). This EUA will remain  in effect (meaning this test can be used) for the duration of the COVID-19 declaration under Section 56 4(b)(1) of the Act, 21 U.S.C. section 360bbb-3(b)(1), unless the authorization is terminated or revoked sooner. Performed at [facility], [place] [address] [place],  [address]       Radiology Studies: DG CHEST PORT 1 VIEW  Result Date: 01/14/2020 CLINICAL DATA:  Wheezing EXAM: PORTABLE CHEST 1 VIEW COMPARISON:  01/07/2020 FINDINGS: Mild patchy opacities in the left upper lobe/perihilar region, central right upper lobe, and bilateral lower lobes. This appearance is suspicious for mild multifocal pneumonia. No pleural effusion or pneumothorax. Cardiomegaly. Postsurgical changes related to prior CABG. Left subclavian  ICD. Median sternotomy. IMPRESSION: Mild multifocal patchy opacities, suspicious for multifocal pneumonia. Electronically Signed   By: Julian Hy M.D.   On: 01/14/2020 11:38     Scheduled Meds: . apixaban  2.5 mg Oral BID  . Chlorhexidine Gluconate Cloth  6 each Topical Daily  . cinacalcet  30 mg Oral Q breakfast  . levothyroxine  200 mcg Oral Q0600   Continuous Infusions:   LOS: 6 days    Time spent: 30 minutes   Shelda Pal, DO [facility] 01/15/2020, 11:01 AM   Available via Epic secure chat 7am-7pm After these hours, please refer to coverage provider listed on [email or web address]

## 2020-01-15 NOTE — Progress Notes (Signed)
Notified MD [place] via secure chat that pt refused AM labs today.   Paulla Fore, RN, BSN

## 2020-01-15 NOTE — TOC Progression Note (Signed)
Transition of Care [facility]) - Progression Note    Patient Details  Name: DEONTAY LADNIER MRN: [id number] Date of Birth: 04/30/1930  Transition of Care [facility]) CM/SW Contact  Bartholomew Crews, RN Phone Number: [phone] 01/15/2020, 4:55 PM  Clinical Narrative:    Received call from [facility]. She has spoken with patient's spouse multiple times, and has selected home care agency [facility]. Spouse to sign contract tomorrow, and coordinate start of care beginning Friday. Patient will need ambulance transport home Friday morning. TOC team following for transition needs.   Expected Discharge Plan: Home w Hospice Care Barriers to Discharge: Continued Medical Work up  Expected Discharge Plan and Services Expected Discharge Plan: [place] Determinants of Health (SDOH) Interventions    Readmission Risk Interventions No flowsheet data found.

## 2020-01-15 NOTE — TOC Progression Note (Signed)
Transition of Care [facility]) - Progression Note    Patient Details  Name: Caleb Lyons MRN: [id number] Date of Birth: 02/08/1930  Transition of Care [facility]) CM/SW Contact  Bartholomew Crews, RN Phone Number: [phone] 01/15/2020, 2:50 PM  Clinical Narrative:    Spoke with spouse this morning - she is expressed her frustrations with contacting home care agencies and feeling overwhelmed about patient coming home before she can arrange services. Discussed additional agencies to follow up with. She stated that the bed had been delivered and the oxygen was to be delivered any time now. NCM contact provided to spouse again for additional follow up.   Spoke with patient at the bedside to update him that NCM was working with spouse to arrange home care assistance. Patient ready to go home, but understands that his wife needs help.   Received call this afternoon from spouse asking about [facility]. She stated that they seemed receptive and she could do 3 hour blocks and the rate was slightly less than previous agency.   TOC team following for transition needs.   Expected Discharge Plan: Home w Hospice Care Barriers to Discharge: Continued Medical Work up  Expected Discharge Plan and Services Expected Discharge Plan: [place] Determinants of Health (SDOH) Interventions    Readmission Risk Interventions No flowsheet data found.

## 2020-01-16 MED ORDER — APIXABAN 2.5 MG PO TABS: 2.5000 mg | ORAL_TABLET | Freq: Two times a day (BID) | ORAL | 2 refills | Status: AC

## 2020-01-16 NOTE — TOC Progression Note (Addendum)
Transition of Care [facility]) - Progression Note    Patient Details  Name: Caleb Lyons MRN: [id number] Date of Birth: May 18, 1930  Transition of Care [facility]) CM/SW Contact  Bartholomew Crews, RN Phone Number: [phone] 01/16/2020, 11:13 AM  Clinical Narrative:    Spoke with patient and spouse at the bedside. Home care arrangements in place with [facility] to begin tomorrow evening. [facility] to provide AM care. Patient to transport home by [facility] tomorrow AM. Home address verified in Epic.  MD notified of plan. Spoke with Cheri at [facility] to confirm plan - they will plan to see patient tomorrow. Bedside RN notified of plan. TOC team following.   Expected Discharge Plan: Home w Hospice Care Barriers to Discharge: Continued Medical Work up  Expected Discharge Plan and Services Expected Discharge Plan: [place] Determinants of Health (SDOH) Interventions    Readmission Risk Interventions No flowsheet data found.

## 2020-01-16 NOTE — Progress Notes (Signed)
[facility] [place] [place] CM at [facility] for pt. Confirmed equipment in home requested. Confirmed that the discharge plan is tomorrow. Pt wife is accepting and has caregivers in place to help assist her with needs through [facility]. The wife confirms the will start tomorrow Friday. [facility] has agreed to accept pt upon discharge at home.   Thank you for your assistance and this referral.  Jannette Fogo RN [phone]

## 2020-01-16 NOTE — Discharge Instructions (Signed)

## 2020-01-16 NOTE — Discharge Summary (Addendum)
Physician Discharge Summary  Caleb Lyons IZT:[id number] DOB: [birth year] DOA: 01/09/2020  PCP: No primary care provider on file.  Admit date: 01/09/2020 Discharge date: 01/17/2020   Admitted From: Home Disposition:  Home w hospice  Recommendations for Outpatient Follow-up:  1. Would recommend stopping Lipitor as going into hospice, would likely not benefit from this medication. 2. We are changing Eliquis to 2.5 mg twice daily.   Discharge Condition: Fair CODE STATUS: DNR  Diet recommendation: General  Brief/Interim Summary: From H&P: "Caleb Lyons is a 86 y.o. male with medical history significant of obesity, OSA, SSS s/p PPM / AICD, chronic diastolic CHF, ICM.  Pt presented to [facility] ED on 2/16 with L sided chest discomfort.  Trop neg x3.  Was found to have AKF with creat of 8.8, BUN 89.  This significantly worse than his baseline creat of 1.2 just last month.  It was suspected that dehydration / pre-renal may be to blame as he was started on lasix 20mg  BID and when that didn't work for peripheral edema it was increased to 40mg  BID and he was on a very fluid restricted diet.  As a result he got 6L+ total fluid at [facility] over course of 2/17.  After 5L of that though, kidney function didn't show significant improvement, only had 900cc UOP, initally urine appearing but now changed to hematuria.  UA showed UTI, UCx growing 100k CFU of a GNR of some sort, started on rocephin.  Bicarb of 19, anion gap of 20.  WBC 13k.  BPs have been running on the low side, especially initially during stay when he came in with systolics in the 38S.  Now 115/58.  Looks like creat after the medicine consult went down to 7.7, BUN 91 though, so not sure that's much of an improvement.  Patient finally able to get bed and transferred over to [facility] now."  Interim: Dehydration was suspected due to outpatient diuretic dosage increase.  After rehydration, he did not improve and had minimal urine output, but  now with blood.  He was started on Rocephin for UTI which he has since completed.  The nephrology team was consulted for his renal failure, cardiology for atrial fibrillation.  Renal function steadily improved without need for dialysis.  Eliquis was changed to 2.5 mg twice daily from 5 mg twice daily given renal impairment.  Subjective on day prior to discharge: Reports feeling fine, ready to leave.  No questions or complaints. Ambulance ride in order for tomorrow AM.   Discharge Diagnoses:  Principal Problem:   Acute renal failure superimposed on stage 4 chronic kidney disease ([place]) Active Problems:   Ischemic cardiomyopathy   HTN (hypertension)   Type 2 diabetes mellitus with circulatory disorder (HCC)   Acute lower UTI   Persistent atrial fibrillation (HCC)   Atrial flutter ([place])   Palliative care by specialist   DNR (do not resuscitate)   Debility   In agreement with assessment of the pressure ulcer as below: Pressure Injury 01/09/20 (Active)  01/09/20 0100  Location:   Location Orientation:   Staging:   Wound Description (Comments):   Present on Admission:        Discharge Instructions  Discharge Instructions    Call MD for:  severe uncontrolled pain   Complete by: As directed    Diet - low sodium heart healthy   Complete by: As directed    Increase activity slowly   Complete by: As directed      Allergies as  of 01/17/2020   No Known Allergies     Medication List    STOP taking these medications   atorvastatin 40 MG tablet Commonly known as: LIPITOR   cinacalcet 30 MG tablet Commonly known as: Sensipar   GLUCOSAMINE-CHONDROITIN PO   PROSTATE SUPPORT PO     TAKE these medications   allopurinol 100 MG tablet Commonly known as: ZYLOPRIM Take 100 mg by mouth daily.   amiodarone 200 MG tablet Commonly known as: PACERONE Take 200 mg by mouth 3 (three) times daily.   apixaban 2.5 MG Tabs tablet Commonly known as: ELIQUIS Take 1 tablet (2.5 mg total)  by mouth 2 (two) times daily. What changed:   medication strength  how much to take   BENGAY EX Apply 1 application topically daily as needed (pain).   Colcrys 0.6 MG tablet Generic drug: colchicine Take 0.6 mg by mouth at bedtime.   diphenoxylate-atropine 2.5-0.025 MG tablet Commonly known as: LOMOTIL Take 2 tablets by mouth 4 (four) times daily as needed for diarrhea or loose stools.   furosemide 20 MG tablet Commonly known as: LASIX Take 20 mg by mouth daily.   hydrALAZINE 10 MG tablet Commonly known as: APRESOLINE Take 10 mg by mouth 4 (four) times daily.   levothyroxine 200 MCG tablet Commonly known as: SYNTHROID Take 200 mcg by mouth daily before breakfast.   metoprolol succinate 50 MG 24 hr tablet Commonly known as: TOPROL-XL Take 50 mg by mouth daily.   multivitamin with minerals Tabs tablet Take 1 tablet by mouth daily.   omeprazole 20 MG capsule Commonly known as: PRILOSEC Take 20 mg by mouth daily.   oxymetazoline 0.05 % nasal spray Commonly known as: AFRIN Place 1 spray into both nostrils at bedtime.   potassium chloride 10 MEQ tablet Commonly known as: KLOR-CON Take 10 mEq by mouth 2 (two) times daily.   senna-docusate 8.6-50 MG tablet Commonly known as: Senokot-S Take 1 tablet by mouth 2 (two) times daily as needed for mild constipation.   tamsulosin 0.4 MG Caps capsule Commonly known as: FLOMAX Take 0.4 mg by mouth at bedtime.   tolterodine 4 MG 24 hr capsule Commonly known as: DETROL LA Take 4 mg by mouth daily.   traMADol 50 MG tablet Commonly known as: ULTRAM Take 50 mg by mouth every 6 (six) hours as needed for moderate pain.   valsartan 160 MG tablet Commonly known as: DIOVAN Take 160 mg by mouth daily.   vitamin C 500 MG tablet Commonly known as: ASCORBIC ACID Take 500 mg by mouth every evening.       No Known Allergies  Consultations:  Nephrology  Cardiology   Procedures/Studies: DG CHEST PORT 1 VIEW  Result  Date: 01/14/2020 CLINICAL DATA:  Wheezing EXAM: PORTABLE CHEST 1 VIEW COMPARISON:  01/07/2020 FINDINGS: Mild patchy opacities in the left upper lobe/perihilar region, central right upper lobe, and bilateral lower lobes. This appearance is suspicious for mild multifocal pneumonia. No pleural effusion or pneumothorax. Cardiomegaly. Postsurgical changes related to prior CABG. Left subclavian ICD. Median sternotomy. IMPRESSION: Mild multifocal patchy opacities, suspicious for multifocal pneumonia. Electronically Signed   By: Julian Hy M.D.   On: 01/14/2020 11:38     Discharge Exam: Vitals:   01/16/20 2045 01/17/20 0446  BP:  (!) 107/57  Pulse: 68 65  Resp: 18 18  Temp:  97.9 F (36.6 C)  SpO2: 98% 99%    General: Pt is alert, awake, not in acute distress Cardiovascular: RRR, S1/S2 +, no  edema Respiratory: CTA bilaterally, no wheezing, no rhonchi, no respiratory distress, no conversational dyspnea  Abdominal: Large panniculus, soft, NT, ND, bowel sounds + Extremities: Appears symmetric bilaterally Psych: Normal mood and affect, stable judgement and insight     The results of significant diagnostics from this hospitalization (including imaging, microbiology, ancillary and laboratory) are listed below for reference.     Microbiology: Recent Results (from the past 240 hour(s))  Culture, blood (routine x 2)     Status: None   Collection Time: 01/09/20  2:07 AM   Specimen: BLOOD  Result Value Ref Range Status   Specimen Description BLOOD RIGHT ANTECUBITAL  Final   Special Requests   Final    BOTTLES DRAWN AEROBIC AND ANAEROBIC Blood Culture adequate volume   Culture   Final    NO GROWTH 5 DAYS Performed at [facility], [address], [place], [place] [address]    Report Status 01/14/2020 FINAL  Final  Culture, blood (routine x 2)     Status: None   Collection Time: 01/09/20  2:21 AM   Specimen: BLOOD LEFT HAND  Result Value Ref Range Status   Specimen Description  BLOOD LEFT HAND  Final   Special Requests AEROBIC BOTTLE ONLY Blood Culture adequate volume  Final   Culture   Final    NO GROWTH 5 DAYS Performed at [facility], [place] [address], [place], [place] [address]    Report Status 01/14/2020 FINAL  Final  SARS CORONAVIRUS 2 (TAT 6-24 HRS) Nasopharyngeal Nasopharyngeal Swab     Status: None   Collection Time: 01/09/20  3:19 AM   Specimen: Nasopharyngeal Swab  Result Value Ref Range Status   SARS Coronavirus 2 NEGATIVE NEGATIVE Final    Comment: (NOTE) SARS-CoV-2 target nucleic acids are NOT DETECTED. The SARS-CoV-2 RNA is generally detectable in upper and lower respiratory specimens during the acute phase of infection. Negative results do not preclude SARS-CoV-2 infection, do not rule out co-infections with other pathogens, and should not be used as the sole basis for treatment or other patient management decisions. Negative results must be combined with clinical observations, patient history, and epidemiological information. The expected result is Negative. Fact Sheet for Patients: SugarRoll.be Fact Sheet for Healthcare Providers: https://www.woods-mathews.com/ This test is not yet approved or cleared by the [place] FDA and  has been authorized for detection and/or diagnosis of SARS-CoV-2 by FDA under an Emergency Use Authorization (EUA). This EUA will remain  in effect (meaning this test can be used) for the duration of the COVID-19 declaration under Section 56 4(b)(1) of the Act, 21 U.S.C. section 360bbb-3(b)(1), unless the authorization is terminated or revoked sooner. Performed at [facility], [place] [address] [place], [place] [address]      Labs: Basic Metabolic Panel: Recent Labs  Lab 01/11/20 [phone] 01/11/20 0734 01/12/20 0423 01/13/20 0418 01/14/20 0514 01/14/20 1346 01/15/20 1819  NA 141   < > 145 144 148* 145 147*  K 3.0*   < > 3.3* 3.5 4.0 3.7 3.7  CL 101    < > 105 105 107 105 104  CO2 25   < > 26 27 28 28 30   GLUCOSE 100*   < > 108* 110* 106* 126* 125*  BUN 71*   < > 55* 42* 31* 29* 24*  CREATININE 4.08*   < > 2.93* 2.15* 1.84* 1.81* 1.64*  CALCIUM 8.4*   < > 8.5* 8.8* 9.1 9.5 9.8  MG 1.1*  --  1.6* 1.9 1.6*  --   --   PHOS 3.3  --  2.7  --   --   --   --    < > = values in this interval not displayed.   Liver Function Tests: Recent Labs  Lab 01/11/20 0734 01/12/20 0423  ALBUMIN 2.5* 2.4*   CBC: Recent Labs  Lab 01/11/20 0734 01/12/20 0423 01/13/20 0418 01/14/20 0514 01/15/20 0535  WBC 8.1 7.5 7.3 7.5 7.9  HGB 12.7* 12.3* 12.8* 12.7* 12.7*  HCT 37.9* 37.3* 39.0 39.3 38.8*  MCV 97.2 97.1 98.0 101.3* 98.7  PLT 182 200 208 204 188   Cardiac Enzymes: Recent Labs  Lab 01/13/20 0418  CKTOTAL 70   Urinalysis    Component Value Date/Time   COLORURINE YELLOW 01/09/2020 1501   APPEARANCEUR CLOUDY (A) 01/09/2020 1501   LABSPEC 1.017 01/09/2020 1501   PHURINE 9.0 (H) 01/09/2020 1501   GLUCOSEU 150 (A) 01/09/2020 1501   HGBUR MODERATE (A) 01/09/2020 1501   BILIRUBINUR NEGATIVE 01/09/2020 1501   KETONESUR 5 (A) 01/09/2020 1501   PROTEINUR >=300 (A) 01/09/2020 1501   NITRITE NEGATIVE 01/09/2020 1501   LEUKOCYTESUR SMALL (A) 01/09/2020 1501   Microbiology Recent Results (from the past 240 hour(s))  Culture, blood (routine x 2)     Status: None   Collection Time: 01/09/20  2:07 AM   Specimen: BLOOD  Result Value Ref Range Status   Specimen Description BLOOD RIGHT ANTECUBITAL  Final   Special Requests   Final    BOTTLES DRAWN AEROBIC AND ANAEROBIC Blood Culture adequate volume   Culture   Final    NO GROWTH 5 DAYS Performed at [facility], [address], [place], [place] [address]    Report Status 01/14/2020 FINAL  Final  Culture, blood (routine x 2)     Status: None   Collection Time: 01/09/20  2:21 AM   Specimen: BLOOD LEFT HAND  Result Value Ref Range Status   Specimen Description BLOOD LEFT HAND  Final    Special Requests AEROBIC BOTTLE ONLY Blood Culture adequate volume  Final   Culture   Final    NO GROWTH 5 DAYS Performed at [facility], [place] [address] [place], [place] [address]    Report Status 01/14/2020 FINAL  Final  SARS CORONAVIRUS 2 (TAT 6-24 HRS) Nasopharyngeal Nasopharyngeal Swab     Status: None   Collection Time: 01/09/20  3:19 AM   Specimen: Nasopharyngeal Swab  Result Value Ref Range Status   SARS Coronavirus 2 NEGATIVE NEGATIVE Final    Comment: (NOTE) SARS-CoV-2 target nucleic acids are NOT DETECTED. The SARS-CoV-2 RNA is generally detectable in upper and lower respiratory specimens during the acute phase of infection. Negative results do not preclude SARS-CoV-2 infection, do not rule out co-infections with other pathogens, and should not be used as the sole basis for treatment or other patient management decisions. Negative results must be combined with clinical observations, patient history, and epidemiological information. The expected result is Negative. Fact Sheet for Patients: SugarRoll.be Fact Sheet for Healthcare Providers: https://www.woods-mathews.com/ This test is not yet approved or cleared by the [place] FDA and  has been authorized for detection and/or diagnosis of SARS-CoV-2 by FDA under an Emergency Use Authorization (EUA). This EUA will remain  in effect (meaning this test can be used) for the duration of the COVID-19 declaration under Section 56 4(b)(1) of the Act, 21 U.S.C. section 360bbb-3(b)(1), unless the authorization is terminated or revoked sooner.  Performed at [facility], [place] [address] [place], [place] [address]      Patient was seen and examined on the day of discharge and was found to be in stable condition. Time coordinating discharge: 40 minutes including assessment and coordination of care, as well as examination of the patient.   SIGNED:  Shelda Pal,  DO [facility] 01/17/2020, 8:42 AM

## 2020-01-17 NOTE — Progress Notes (Signed)
  PROGRESS NOTE  Patient discharge summary yesterday, no charge note today. Reports no complaints. Waiting for ride, which is scheduled for this AM.   Caleb Pal, DO [facility] 01/17/2020, 11:06 AM  Available via Epic secure chat 7am-7pm After these hours, please refer to coverage provider listed on [email or web address]

## 2020-01-17 NOTE — Progress Notes (Signed)
NURSING PROGRESS NOTE  Caleb Lyons [id number] Discharge Data: 01/17/2020 9:28 AM Attending Provider: Shelda Pal,* PCP:No primary care provider on file.     Caleb Lyons to be D/C'd Home VIA PTAR per MD order.  Discussed with the patient the After Visit Summary and all questions fully answered. All IV's discontinued with no bleeding noted. All belongings returned to patient for patient to take home.   Last Vital Signs:  Blood pressure (!) 144/85, pulse 77, temperature 98.2 F (36.8 C), temperature source Oral, resp. rate 20, height 6\' 1"  (1.854 m), weight (!) 172.8 kg, SpO2 93 %.  Discharge Medication List Allergies as of 01/17/2020   No Known Allergies     Medication List    STOP taking these medications   atorvastatin 40 MG tablet Commonly known as: LIPITOR   cinacalcet 30 MG tablet Commonly known as: Sensipar   GLUCOSAMINE-CHONDROITIN PO   PROSTATE SUPPORT PO     TAKE these medications   allopurinol 100 MG tablet Commonly known as: ZYLOPRIM Take 100 mg by mouth daily. Notes to patient: 01/18/2020   amiodarone 200 MG tablet Commonly known as: PACERONE Take 200 mg by mouth 3 (three) times daily. Notes to patient: 01/17/2020   apixaban 2.5 MG Tabs tablet Commonly known as: ELIQUIS Take 1 tablet (2.5 mg total) by mouth 2 (two) times daily. What changed:   medication strength  how much to take   BENGAY EX Apply 1 application topically daily as needed (pain).   Colcrys 0.6 MG tablet Generic drug: colchicine Take 0.6 mg by mouth at bedtime.   diphenoxylate-atropine 2.5-0.025 MG tablet Commonly known as: LOMOTIL Take 2 tablets by mouth 4 (four) times daily as needed for diarrhea or loose stools.   furosemide 20 MG tablet Commonly known as: LASIX Take 20 mg by mouth daily.   hydrALAZINE 10 MG tablet Commonly known as: APRESOLINE Take 10 mg by mouth 4 (four) times daily. Notes to patient: 01/17/2020   levothyroxine 200 MCG tablet Commonly  known as: SYNTHROID Take 200 mcg by mouth daily before breakfast.   metoprolol succinate 50 MG 24 hr tablet Commonly known as: TOPROL-XL Take 50 mg by mouth daily.   multivitamin with minerals Tabs tablet Take 1 tablet by mouth daily.   omeprazole 20 MG capsule Commonly known as: PRILOSEC Take 20 mg by mouth daily.   oxymetazoline 0.05 % nasal spray Commonly known as: AFRIN Place 1 spray into both nostrils at bedtime.   potassium chloride 10 MEQ tablet Commonly known as: KLOR-CON Take 10 mEq by mouth 2 (two) times daily.   senna-docusate 8.6-50 MG tablet Commonly known as: Senokot-S Take 1 tablet by mouth 2 (two) times daily as needed for mild constipation.   tamsulosin 0.4 MG Caps capsule Commonly known as: FLOMAX Take 0.4 mg by mouth at bedtime. Notes to patient: 01/17/2020   tolterodine 4 MG 24 hr capsule Commonly known as: DETROL LA Take 4 mg by mouth daily. Notes to patient: 01/18/2020   traMADol 50 MG tablet Commonly known as: ULTRAM Take 50 mg by mouth every 6 (six) hours as needed for moderate pain.   valsartan 160 MG tablet Commonly known as: DIOVAN Take 160 mg by mouth daily. Notes to patient: 01/17/2020   vitamin C 500 MG tablet Commonly known as: ASCORBIC ACID Take 500 mg by mouth every evening. Notes to patient: 01/17/2020

## 2020-01-17 NOTE — TOC Transition Note (Signed)
Transition of Care [facility]) - CM/SW Discharge Note   Patient Details  Name: EOIN WILLDEN MRN: [id number] Date of Birth: 02/12/1930  Transition of Care [facility]) CM/SW Contact:  Bartholomew Crews, RN Phone Number: [phone] 01/17/2020, 8:45 AM   Clinical Narrative:    Spoke with patient's spouse this morning - she is ready for patient to return home. Nursing aware. MD aware - DC summary and order in place. [facility] notified for transport. No further TOC needs identified at this time.    Final next level of care: Home w Hospice Care Barriers to Discharge: No Barriers Identified   Patient Goals and CMS Choice Patient states their goals for this hospitalization and ongoing recovery are:: home with hospice and focus on comfort CMS Medicare.gov Compare Post Acute Care list provided to:: Patient Choice offered to / list presented to : Patient, Spouse  Discharge Placement              Patient [facility] bed at: [facility] Patient to be transferred to facility by: Victor Name of family member notified: Caliber Landess Patient and family notified of of transfer: 01/17/20  Discharge Plan and Services                DME Arranged: N/A DME Agency: NA       HH Arranged: NA HH Agency: NA        Social Determinants of Health (SDOH) Interventions     Readmission Risk Interventions No flowsheet data found.

## 2020-01-23 ENCOUNTER — Other Ambulatory Visit: Payer: Self-pay | Admitting: *Deleted

## 2020-01-23 NOTE — Patient Outreach (Signed)
[facility]) Care Management  01/23/2020  KIAM BRANSFIELD 1930/10/13 [id number]   Referral received from care management assistant as member triggered red on EMMI dashboard.  Upon further review of chart, noted that he discharged home with hospice services starting 2/26.  Will not open case at this time, will remove member from [facility] call list.  Valente David, RN, MSN [place] [phone]

## 2020-01-24 ENCOUNTER — Ambulatory Visit (INDEPENDENT_AMBULATORY_CARE_PROVIDER_SITE_OTHER): Admitting: *Deleted

## 2020-01-24 DIAGNOSIS — I255 Ischemic cardiomyopathy: Secondary | ICD-10-CM | POA: Diagnosis not present

## 2020-01-24 LAB — CUP PACEART REMOTE DEVICE CHECK
Battery Remaining Longevity: 72 mo
Battery Remaining Percentage: 83 %
Battery Voltage: 3.01 V
Brady Statistic AP VP Percent: 28 %
Brady Statistic AP VS Percent: 1 %
Brady Statistic AS VP Percent: 71 %
Brady Statistic AS VS Percent: 1 %
Brady Statistic RA Percent Paced: 28 %
Brady Statistic RV Percent Paced: 99 %
Date Time Interrogation Session: 20210305020016
HighPow Impedance: 50 Ohm
HighPow Impedance: 51 Ohm
Implantable Lead Implant Date: 20020930
Implantable Lead Implant Date: 20110303
Implantable Lead Location: 753859
Implantable Lead Location: 753860
Implantable Lead Model: 7121
Implantable Pulse Generator Implant Date: 20200228
Lead Channel Impedance Value: 380 Ohm
Lead Channel Impedance Value: 380 Ohm
Lead Channel Pacing Threshold Amplitude: 0.75 V
Lead Channel Pacing Threshold Amplitude: 1 V
Lead Channel Pacing Threshold Pulse Width: 0.5 ms
Lead Channel Pacing Threshold Pulse Width: 0.5 ms
Lead Channel Sensing Intrinsic Amplitude: 10.6 mV
Lead Channel Sensing Intrinsic Amplitude: 4.7 mV
Lead Channel Setting Pacing Amplitude: 2 V
Lead Channel Setting Pacing Amplitude: 2.5 V
Lead Channel Setting Pacing Pulse Width: 0.5 ms
Lead Channel Setting Sensing Sensitivity: 0.5 mV
Pulse Gen Serial Number: 9834627

## 2020-01-25 NOTE — Progress Notes (Signed)
ICD remote 

## 2020-01-31 DIAGNOSIS — E038 Other specified hypothyroidism: Secondary | ICD-10-CM | POA: Diagnosis not present

## 2020-01-31 DIAGNOSIS — E559 Vitamin D deficiency, unspecified: Secondary | ICD-10-CM | POA: Diagnosis not present

## 2020-01-31 DIAGNOSIS — E782 Mixed hyperlipidemia: Secondary | ICD-10-CM | POA: Diagnosis not present

## 2020-01-31 DIAGNOSIS — I10 Essential (primary) hypertension: Secondary | ICD-10-CM | POA: Diagnosis not present

## 2020-03-21 DEATH — deceased
